# Patient Record
Sex: Female | Born: 1940
Health system: Southern US, Community
[De-identification: ages and names within clinical notes are randomized; demographics above are authoritative.]

## PROBLEM LIST (undated history)

## (undated) DIAGNOSIS — E041 Nontoxic single thyroid nodule: Secondary | ICD-10-CM

## (undated) DIAGNOSIS — Z9289 Personal history of other medical treatment: Secondary | ICD-10-CM

## (undated) DIAGNOSIS — K219 Gastro-esophageal reflux disease without esophagitis: Secondary | ICD-10-CM

## (undated) DIAGNOSIS — N6310 Unspecified lump in the right breast, unspecified quadrant: Secondary | ICD-10-CM

## (undated) DIAGNOSIS — E785 Hyperlipidemia, unspecified: Secondary | ICD-10-CM

## (undated) DIAGNOSIS — I2510 Atherosclerotic heart disease of native coronary artery without angina pectoris: Secondary | ICD-10-CM

## (undated) DIAGNOSIS — R413 Other amnesia: Secondary | ICD-10-CM

## (undated) DIAGNOSIS — Z8673 Personal history of transient ischemic attack (TIA), and cerebral infarction without residual deficits: Secondary | ICD-10-CM

## (undated) DIAGNOSIS — M170 Bilateral primary osteoarthritis of knee: Secondary | ICD-10-CM

## (undated) DIAGNOSIS — H918X2 Other specified hearing loss, left ear: Secondary | ICD-10-CM

## (undated) DIAGNOSIS — E1129 Type 2 diabetes mellitus with other diabetic kidney complication: Secondary | ICD-10-CM

## (undated) DIAGNOSIS — R809 Proteinuria, unspecified: Secondary | ICD-10-CM

## (undated) DIAGNOSIS — J984 Other disorders of lung: Secondary | ICD-10-CM

## (undated) DIAGNOSIS — I10 Essential (primary) hypertension: Secondary | ICD-10-CM

## (undated) DIAGNOSIS — E119 Type 2 diabetes mellitus without complications: Secondary | ICD-10-CM

## (undated) DIAGNOSIS — F329 Major depressive disorder, single episode, unspecified: Secondary | ICD-10-CM

## (undated) DIAGNOSIS — R609 Edema, unspecified: Secondary | ICD-10-CM

## (undated) DIAGNOSIS — D539 Nutritional anemia, unspecified: Secondary | ICD-10-CM

## (undated) DIAGNOSIS — E039 Hypothyroidism, unspecified: Secondary | ICD-10-CM

## (undated) DIAGNOSIS — R079 Chest pain, unspecified: Secondary | ICD-10-CM

## (undated) HISTORY — DX: Type 2 diabetes mellitus without complications: E11.9

## (undated) HISTORY — PX: TUBAL LIGATION: SHX77

## (undated) HISTORY — DX: Nutritional anemia, unspecified: D53.9

## (undated) HISTORY — DX: Atherosclerotic heart disease of native coronary artery without angina pectoris: I25.10

## (undated) HISTORY — DX: Other specified hearing loss, left ear: H91.8X2

## (undated) HISTORY — DX: Hypothyroidism, unspecified: E03.9

## (undated) HISTORY — DX: Proteinuria, unspecified: R80.9

## (undated) HISTORY — DX: Other amnesia: R41.3

## (undated) HISTORY — DX: Chest pain, unspecified: R07.9

## (undated) HISTORY — DX: Type 2 diabetes mellitus with other diabetic kidney complication: E11.29

## (undated) HISTORY — DX: Other disorders of lung: J98.4

## (undated) HISTORY — DX: Unspecified lump in the right breast, unspecified quadrant: N63.10

## (undated) HISTORY — DX: Major depressive disorder, single episode, unspecified: F32.9

## (undated) HISTORY — DX: Nontoxic single thyroid nodule: E04.1

## (undated) HISTORY — DX: Edema, unspecified: R60.9

## (undated) HISTORY — DX: Personal history of transient ischemic attack (TIA), and cerebral infarction without residual deficits: Z86.73

## (undated) HISTORY — DX: Hyperlipidemia, unspecified: E78.5

## (undated) HISTORY — DX: Personal history of other medical treatment: Z92.89

## (undated) HISTORY — DX: Bilateral primary osteoarthritis of knee: M17.0

---

## 1987-09-10 HISTORY — PX: VAGINAL HYSTERECTOMY: SUR661

## 2007-08-10 ENCOUNTER — Emergency Department (HOSPITAL_COMMUNITY): Admission: EM | Admit: 2007-08-10 | Discharge: 2007-08-10 | Payer: Self-pay | Admitting: Emergency Medicine

## 2007-09-14 ENCOUNTER — Ambulatory Visit: Payer: Self-pay | Admitting: Cardiovascular Disease

## 2007-09-14 ENCOUNTER — Ambulatory Visit: Payer: Self-pay | Admitting: Cardiology

## 2007-09-14 ENCOUNTER — Inpatient Hospital Stay (HOSPITAL_COMMUNITY): Admission: EM | Admit: 2007-09-14 | Discharge: 2007-09-17 | Payer: Self-pay | Admitting: Family Medicine

## 2007-09-16 ENCOUNTER — Encounter (INDEPENDENT_AMBULATORY_CARE_PROVIDER_SITE_OTHER): Payer: Self-pay | Admitting: Internal Medicine

## 2007-09-23 ENCOUNTER — Ambulatory Visit: Payer: Self-pay | Admitting: *Deleted

## 2007-10-07 ENCOUNTER — Ambulatory Visit: Payer: Self-pay | Admitting: Cardiovascular Disease

## 2007-10-12 ENCOUNTER — Ambulatory Visit: Payer: Self-pay | Admitting: Family Medicine

## 2007-10-27 ENCOUNTER — Ambulatory Visit (HOSPITAL_COMMUNITY): Admission: RE | Admit: 2007-10-27 | Discharge: 2007-10-27 | Payer: Self-pay | Admitting: Family Medicine

## 2007-11-05 ENCOUNTER — Ambulatory Visit: Payer: Self-pay | Admitting: Cardiovascular Disease

## 2007-11-20 ENCOUNTER — Ambulatory Visit: Payer: Self-pay | Admitting: Internal Medicine

## 2007-11-23 ENCOUNTER — Ambulatory Visit (HOSPITAL_COMMUNITY): Admission: RE | Admit: 2007-11-23 | Discharge: 2007-11-23 | Payer: Self-pay | Admitting: Family Medicine

## 2007-11-27 ENCOUNTER — Ambulatory Visit: Payer: Self-pay | Admitting: Internal Medicine

## 2008-02-03 ENCOUNTER — Ambulatory Visit: Payer: Self-pay | Admitting: Family Medicine

## 2008-03-09 ENCOUNTER — Ambulatory Visit: Payer: Self-pay | Admitting: Internal Medicine

## 2008-03-28 ENCOUNTER — Encounter: Payer: Self-pay | Admitting: Family Medicine

## 2008-03-28 ENCOUNTER — Ambulatory Visit: Payer: Self-pay | Admitting: Internal Medicine

## 2008-03-28 LAB — CONVERTED CEMR LAB
AST: 16 units/L (ref 0–37)
Albumin: 4 g/dL (ref 3.5–5.2)
Alkaline Phosphatase: 70 units/L (ref 39–117)
BUN: 24 mg/dL — ABNORMAL HIGH (ref 6–23)
Basophils Relative: 0 % (ref 0–1)
Calcium: 9.3 mg/dL (ref 8.4–10.5)
Chloride: 106 meq/L (ref 96–112)
Glucose, Bld: 114 mg/dL — ABNORMAL HIGH (ref 70–99)
HDL: 37 mg/dL — ABNORMAL LOW (ref >39)
Hemoglobin: 12 g/dL (ref 12.0–15.0)
LDL Cholesterol: 102 mg/dL — ABNORMAL HIGH (ref 0–99)
Lymphocytes Relative: 34 % (ref 12–46)
MCHC: 30.7 g/dL (ref 30.0–36.0)
Monocytes Absolute: 0.4 10*3/uL (ref 0.1–1.0)
Monocytes Relative: 9 % (ref 3–12)
Neutro Abs: 2.5 10*3/uL (ref 1.7–7.7)
Neutrophils Relative %: 52 % (ref 43–77)
Potassium: 4.7 meq/L (ref 3.5–5.3)
RBC: 4.3 M/uL (ref 3.87–5.11)
Sodium: 140 meq/L (ref 135–145)
Total Protein: 7.7 g/dL (ref 6.0–8.3)
WBC: 4.7 10*3/uL (ref 4.0–10.5)

## 2008-04-04 ENCOUNTER — Ambulatory Visit: Payer: Self-pay | Admitting: Internal Medicine

## 2008-06-27 ENCOUNTER — Ambulatory Visit: Payer: Self-pay | Admitting: Family Medicine

## 2008-07-01 ENCOUNTER — Ambulatory Visit: Payer: Self-pay | Admitting: Cardiovascular Disease

## 2009-02-10 ENCOUNTER — Encounter (INDEPENDENT_AMBULATORY_CARE_PROVIDER_SITE_OTHER): Payer: Self-pay | Admitting: *Deleted

## 2009-02-14 ENCOUNTER — Encounter: Payer: Self-pay | Admitting: Cardiovascular Disease

## 2009-02-14 DIAGNOSIS — E785 Hyperlipidemia, unspecified: Secondary | ICD-10-CM

## 2009-02-14 DIAGNOSIS — E041 Nontoxic single thyroid nodule: Secondary | ICD-10-CM

## 2009-02-14 DIAGNOSIS — D539 Nutritional anemia, unspecified: Secondary | ICD-10-CM | POA: Insufficient documentation

## 2009-02-14 DIAGNOSIS — I10 Essential (primary) hypertension: Secondary | ICD-10-CM

## 2009-02-14 DIAGNOSIS — J984 Other disorders of lung: Secondary | ICD-10-CM | POA: Insufficient documentation

## 2009-02-17 ENCOUNTER — Ambulatory Visit: Payer: Self-pay | Admitting: Cardiovascular Disease

## 2009-02-17 DIAGNOSIS — R609 Edema, unspecified: Secondary | ICD-10-CM

## 2009-02-17 DIAGNOSIS — R079 Chest pain, unspecified: Secondary | ICD-10-CM

## 2009-03-20 ENCOUNTER — Ambulatory Visit: Payer: Self-pay | Admitting: Internal Medicine

## 2009-04-03 ENCOUNTER — Ambulatory Visit: Payer: Self-pay | Admitting: Cardiology

## 2009-04-05 ENCOUNTER — Encounter: Payer: Self-pay | Admitting: Physician Assistant

## 2009-04-05 ENCOUNTER — Ambulatory Visit: Payer: Self-pay | Admitting: Cardiology

## 2009-04-10 ENCOUNTER — Telehealth (INDEPENDENT_AMBULATORY_CARE_PROVIDER_SITE_OTHER): Payer: Self-pay | Admitting: *Deleted

## 2009-04-11 ENCOUNTER — Encounter: Payer: Self-pay | Admitting: Cardiovascular Disease

## 2009-04-11 ENCOUNTER — Ambulatory Visit: Payer: Self-pay

## 2009-09-04 ENCOUNTER — Encounter (INDEPENDENT_AMBULATORY_CARE_PROVIDER_SITE_OTHER): Payer: Self-pay | Admitting: *Deleted

## 2009-09-26 ENCOUNTER — Ambulatory Visit: Payer: Self-pay | Admitting: Cardiovascular Disease

## 2010-01-12 ENCOUNTER — Ambulatory Visit: Payer: Self-pay | Admitting: Internal Medicine

## 2010-02-12 ENCOUNTER — Ambulatory Visit: Payer: Self-pay | Admitting: Internal Medicine

## 2010-10-09 NOTE — Assessment & Plan Note (Signed)
Summary: rov/ gd   Primary Provider:  dr Sofie Hartigan  CC:  pt states she has chest pain that comes and goes also sob pt is doing ok today.  History of Present Illness: Wendy Petty is seen today for F/U of HTN, and SSCP.  She had a normal myovue in 2009.  She gets only very occasional SSCP when she gets upset about a home situation.  She walks without SSCP or dyspnea.  She saw our PA Jacolyn Reedy in August and was not taking her medications.  Her BP was elevated.  It is improved with Rx and compliance.  The grandaughter interpreted for [place] today.  She gets her meds fillied at the [facility] on [facility] and sees Dr. Nadyne Coombes for her primary care needs  Current Problems (verified): 1)  Edema  (ICD-782.3) 2)  Chest Pain Unspecified  (ICD-786.50) 3)  Thyroid Nodule  (ICD-241.0) 4)  Pulmonary Nodule  (ICD-518.89) 5)  Anemia, Chronic  (ICD-281.9) 6)  Hyperlipidemia  (ICD-272.4) 7)  Hypertension  (ICD-401.9)  Current Medications (verified): 1)  Amlodipine Besylate 5 Mg Tabs (Amlodipine Besylate) .... Take One Tablet By Mouth Daily 2)  Aspirin 81 Mg Tbec (Aspirin) .... Take One Tablet By Mouth Daily 3)  Carvedilol 12.5 Mg Tabs (Carvedilol) .... Take One Tablet Two Times A Day 4)  Losartan Potassium-Hctz 50-12.5 Mg Tabs (Losartan Potassium-Hctz) .... Take One Tablet Once Daily 5)  Oyster Shell Calcium 500 Mg Tabs (Oyster Shell) .Marland Kitchen.. 1 Atb Once Daily 6)  Pravastatin Sodium 40 Mg Tabs (Pravastatin Sodium) .... Take One Tablet Once Daily 7)  Omeprazole 20 Mg Cpdr (Omeprazole) .... Once Daily 8)  Tramadol Hcl 50 Mg Tabs (Tramadol Hcl) .... Take One Tablet Every 8 Hours  Allergies (verified): No Known Drug Allergies  Past History:  Past Medical History: Last updated: 02/14/2009  Pulmonary nodule   Thyroid nodule   Anemia Hyperlipidemia Hypertension  Past Surgical History: Last updated: 02/14/2009 none noted  Family History: Last updated: 02/14/2009  Unable to be obtained due to  language barrier speak but a very limited amount of English.  Originally from [place].  Social History: Last updated: 02/17/2009 Tobacco Use - No.  Alcohol Use - no Married 5 children Only speaks Spanish  Review of Systems       Denies fever, malais, weight loss, blurry vision, decreased visual acuity, cough, sputum, SOB, hemoptysis, pleuritic pain, palpitaitons, heartburn, abdominal pain, melena, lower extremity edema, claudication, or rash. All other systems reviewed and negative  Vital Signs:  Patient profile:   70 year old female Height:      58 inches Weight:      151 pounds Pulse rate:   70 / minute Pulse rhythm:   regular Resp:     12 per minute BP sitting:   140 / 80  (left arm) Cuff size:   regular  Vitals Entered By: Kem Parkinson (September 26, 2009 4:32 PM)  Physical Exam  General:  Affect appropriate Healthy:  appears stated age HEENT: normal Neck supple with no adenopathy JVP normal no bruits no thyromegaly Lungs clear with no wheezing and good diaphragmatic motion Heart:  S1/S2 no murmur,rub, gallop or click PMI normal Abdomen: benighn, BS positve, no tenderness, no AAA no bruit.  No HSM or HJR Distal pulses intact with no bruits No edema Neuro non-focal Skin warm and dry    Impression & Recommendations:  Problem # 1:  CHEST PAIN UNSPECIFIED (ICD-786.50) Normal myovue 2009 observe  continue ASA and BB Her updated  medication list for this problem includes:    Amlodipine Besylate 5 Mg Tabs (Amlodipine besylate) .Marland Kitchen... Take one tablet by mouth daily    Aspirin 81 Mg Tbec (Aspirin) .Marland Kitchen... Take one tablet by mouth daily    Carvedilol 12.5 Mg Tabs (Carvedilol) .Marland Kitchen... Take one tablet two times a day  Problem # 2:  HYPERLIPIDEMIA (ICD-272.4) [facility] with no side effects Her updated medication list for this problem includes:    Pravastatin Sodium 40 Mg Tabs (Pravastatin sodium) .Marland Kitchen... Take one tablet once daily  CHOL: 172 (03/28/2008)   LDL: 102  (03/28/2008)   HDL: 37 (03/28/2008)   TG: 163 (03/28/2008)  Problem # 3:  HYPERTENSION (ICD-401.9) Well controlled when compliant with meds.  Low sodium diet The following medications were removed from the medication list:    Hydrochlorothiazide 25 Mg Tabs (Hydrochlorothiazide) .Marland Kitchen... Take one daily Her updated medication list for this problem includes:    Amlodipine Besylate 5 Mg Tabs (Amlodipine besylate) .Marland Kitchen... Take one tablet by mouth daily    Aspirin 81 Mg Tbec (Aspirin) .Marland Kitchen... Take one tablet by mouth daily    Carvedilol 12.5 Mg Tabs (Carvedilol) .Marland Kitchen... Take one tablet two times a day    Losartan Potassium-hctz 50-12.5 Mg Tabs (Losartan potassium-hctz) .Marland Kitchen... Take one tablet once daily  Patient Instructions: 1)  Your physician recommends that you schedule a follow-up appointment in: 6 months

## 2010-10-18 ENCOUNTER — Encounter (INDEPENDENT_AMBULATORY_CARE_PROVIDER_SITE_OTHER): Payer: Self-pay | Admitting: Family Medicine

## 2010-10-18 ENCOUNTER — Other Ambulatory Visit (HOSPITAL_COMMUNITY): Payer: Self-pay | Admitting: Family Medicine

## 2010-10-18 DIAGNOSIS — E041 Nontoxic single thyroid nodule: Secondary | ICD-10-CM

## 2010-10-18 LAB — CONVERTED CEMR LAB
ALT: 14 units/L (ref 0–35)
AST: 13 units/L (ref 0–37)
Albumin: 4.3 g/dL (ref 3.5–5.2)
Alkaline Phosphatase: 65 units/L (ref 39–117)
Calcium: 9.8 mg/dL (ref 8.4–10.5)
Chloride: 104 meq/L (ref 96–112)
Potassium: 4.4 meq/L (ref 3.5–5.3)
Sodium: 139 meq/L (ref 135–145)
Total Protein: 7.6 g/dL (ref 6.0–8.3)

## 2010-10-24 ENCOUNTER — Inpatient Hospital Stay (HOSPITAL_COMMUNITY): Admission: RE | Admit: 2010-10-24 | Payer: Self-pay | Source: Ambulatory Visit

## 2010-10-30 ENCOUNTER — Other Ambulatory Visit (HOSPITAL_COMMUNITY): Payer: Self-pay | Admitting: Family Medicine

## 2010-10-30 DIAGNOSIS — E041 Nontoxic single thyroid nodule: Secondary | ICD-10-CM

## 2010-11-02 ENCOUNTER — Ambulatory Visit (INDEPENDENT_AMBULATORY_CARE_PROVIDER_SITE_OTHER): Payer: Self-pay | Admitting: Cardiovascular Disease

## 2010-11-02 ENCOUNTER — Encounter: Payer: Self-pay | Admitting: Cardiovascular Disease

## 2010-11-02 DIAGNOSIS — R072 Precordial pain: Secondary | ICD-10-CM

## 2010-11-02 DIAGNOSIS — E785 Hyperlipidemia, unspecified: Secondary | ICD-10-CM

## 2010-11-02 DIAGNOSIS — I10 Essential (primary) hypertension: Secondary | ICD-10-CM

## 2010-11-05 ENCOUNTER — Ambulatory Visit (HOSPITAL_COMMUNITY)
Admission: RE | Admit: 2010-11-05 | Discharge: 2010-11-05 | Disposition: A | Discharge status: Home / Self Care | Payer: Self-pay | Source: Ambulatory Visit | Attending: Family Medicine | Admitting: Family Medicine

## 2010-11-05 DIAGNOSIS — E049 Nontoxic goiter, unspecified: Secondary | ICD-10-CM | POA: Insufficient documentation

## 2010-11-05 DIAGNOSIS — E041 Nontoxic single thyroid nodule: Secondary | ICD-10-CM

## 2010-11-06 NOTE — Assessment & Plan Note (Signed)
Summary: PER PT REQUEST CHEST PAIN. PT. REFUSED APPT. WITH P.A./WANTS .Marland KitchenMarland Kitchen   Visit Type:  follow up Primary Provider:  dr Sofie Hartigan  CC:  Chest pain about a month ago- Tiredness.  History of Present Illness: F/U for SSCP.  Normla myovue 2009.  CRF;s HTN and elevated lipids.  She has occasional fleeting sharp chest pain that does not sound cardiac.  History from [place] our interpretor as she only speaks [place]. She is compliant with her meds.  Seeing [facility] for primary needs.  Goes to [place] every October.  Has family support with son and daughter close by.  Denies dyspnea, palpitaitons, edema or syncope  Current Problems (verified): 1)  Edema  (ICD-782.3) 2)  Chest Pain Unspecified  (ICD-786.50) 3)  Thyroid Nodule  (ICD-241.0) 4)  Pulmonary Nodule  (ICD-518.89) 5)  Anemia, Chronic  (ICD-281.9) 6)  Hyperlipidemia  (ICD-272.4) 7)  Hypertension  (ICD-401.9)  Current Medications (verified): 1)  Amlodipine Besylate 5 Mg Tabs (Amlodipine Besylate) .... Take One Tablet By Mouth Daily 2)  Aspirin 81 Mg Tbec (Aspirin) .... Take One Tablet By Mouth Daily 3)  Carvedilol 12.5 Mg Tabs (Carvedilol) .... Take One Tablet Two Times A Day 4)  Losartan Potassium-Hctz 100-25 Mg Tabs (Losartan Potassium-Hctz) .... Take 1 Tablet By Mouth Once A Day 5)  Lipitor 10 Mg Tabs (Atorvastatin Calcium) .... Take One Tablet By Mouth Daily. 6)  Omeprazole 20 Mg Cpdr (Omeprazole) .... Once Daily 7)  Tramadol Hcl 50 Mg Tabs (Tramadol Hcl) .... Take One Tablet Every 8 Hours 8)  Nabumetone 500 Mg Tabs (Nabumetone) .... As Needed  Allergies (verified): No Known Drug Allergies  Past History:  Past Medical History: Last updated: 02/14/2009  Pulmonary nodule   Thyroid nodule   Anemia Hyperlipidemia Hypertension  Past Surgical History: Last updated: 02/14/2009 none noted  Family History: Last updated: 02/14/2009  Unable to be obtained due to language barrier speak but a very limited amount of English.   Originally from [place].  Social History: Last updated: 02/17/2009 Tobacco Use - No.  Alcohol Use - no Married 5 children Only speaks Spanish  Review of Systems       Denies fever, malais, weight loss, blurry vision, decreased visual acuity, cough, sputum, SOB, hemoptysis, pleuritic pain, palpitaitons, heartburn, abdominal pain, melena, lower extremity edema, claudication, or rash.   Vital Signs:  Patient profile:   69 year old female Height:      58 inches Weight:      151.75 pounds BMI:     31.83 Pulse rhythm:   regular Resp:     18 per minute BP sitting:   130 / 80  (left arm) Cuff size:   large  Vitals Entered By: Kem Parkinson (November 02, 2010 10:22 AM)  Physical Exam  General:  Affect appropriate Healthy:  appears stated age HEENT: normal Neck supple with no adenopathy JVP normal no bruits no thyromegaly Lungs clear with no wheezing and good diaphragmatic motion Heart:  S1/S2 no murmur,rub, gallop or click PMI normal Abdomen: benighn, BS positve, no tenderness, no AAA no bruit.  No HSM or HJR Distal pulses intact with no bruits No edema Neuro non-focal Skin warm and dry    Impression & Recommendations:  Problem # 1:  CHEST PAIN UNSPECIFIED (ICD-786.50) Atypical, normal ECG no need for further w/u Her updated medication list for this problem includes:    Amlodipine Besylate 5 Mg Tabs (Amlodipine besylate) .Marland Kitchen... Take one tablet by mouth daily    Aspirin 81 Mg Tbec (  Aspirin) .Marland Kitchen... Take one tablet by mouth daily    Carvedilol 12.5 Mg Tabs (Carvedilol) .Marland Kitchen... Take one tablet two times a day  Problem # 2:  HYPERLIPIDEMIA (ICD-272.4) At goal continue lipitor Her updated medication list for this problem includes:    Lipitor 10 Mg Tabs (Atorvastatin calcium) .Marland Kitchen... Take one tablet by mouth daily.  CHOL: 172 (03/28/2008)   LDL: 102 (03/28/2008)   HDL: 37 (03/28/2008)   TG: 163 (03/28/2008)  Problem # 3:  HYPERTENSION (ICD-401.9) Well controlled Her  updated medication list for this problem includes:    Amlodipine Besylate 5 Mg Tabs (Amlodipine besylate) .Marland Kitchen... Take one tablet by mouth daily    Aspirin 81 Mg Tbec (Aspirin) .Marland Kitchen... Take one tablet by mouth daily    Carvedilol 12.5 Mg Tabs (Carvedilol) .Marland Kitchen... Take one tablet two times a day    Losartan Potassium-hctz 100-25 Mg Tabs (Losartan potassium-hctz) .Marland Kitchen... Take 1 tablet by mouth once a day  Patient Instructions: 1)  Your physician wants you to follow-up in: one year  You will receive a reminder letter in the mail two months in advance. If you don't receive a letter, please call our office to schedule the follow-up appointment.   EKG Report  Procedure date:  11/02/2010  Findings:      NSR 77 Normal ECG

## 2011-01-22 NOTE — Assessment & Plan Note (Signed)
[facility]                            CARDIOLOGY OFFICE NOTE   [name], [name]                    MRN:          [id number]  DATE:07/01/2008                            DOB:          26-Jul-1941    [name] is a 70 year old patient of Dr. Dietrich Pates.  She has a history  of chest pain.  She had a nonischemic Myoview in January.  Her echo  shows good LV function.  Doree Fudge, one of our nurses was in the room with me  as a Spanish interpreter.  The patient did not speak [place].   She continues to complain of pressure in her chest with exertion, she  can sometimes get it at rest.  She has not taken nitroglycerin for it.  She has occasional exertional dyspnea.  She also complains of pain in  her thigh after walking up 10-15 steps, these symptoms are stable.  They  have not changed. The symptoms were identical when she had a normal  Myoview in January.   I told the patient that I did not think that this was angina.  I  reviewed a Myoview study and it was totally normal.   Given her age and totally normal Myoview, I would like to try to  continue to watch her medically.   Her exam shows a very good peripheral pulses and I do not think her leg  pain has anything to do with claudication.  Her review of systems  otherwise negative.  She need refills on some prescriptions today.  She  is on hydrochlorothiazide 25 a day, amlodipine 5 a day, aspirin a day,  Coreg 12.5 mg b.i.d., and Cozaar 100 a day.   Exam is remarkable for a well tanned Hispanic female in no distress.  Blood pressure 130/80, weight 149, pulse 84 regular, respiratory 14,  afebrile.  HEENT:  Unremarkable.  Carotids are normal without bruit.  No  lymphadenopathy, thyromegaly, or JVP elevation.  Lungs are clear  diaphragmatic motion.  No wheezing.  S1-S2, heart sounds PMI normal.  Abdomen is benign.  Bowel sounds positive.  No AAA.  No tenderness.  No  bruit.  No hepatosplenomegaly or  hepatojugular reflux tenderness.  Popliteals were +3.  PTs and DTs are +3.  No lower extremity edema.  No  muscular weakness.   IMPRESSION:  1. Chest pain, although it is exertional.  I do not think it is      angina.  She had totally normal Myoview.  Continue current dose of      aspirin and beta-blocker.  Consider followup stress testing in      January.  2. Hypertension currently well controlled.  Continue current dose of      Cozaar, low-sodium diet.  3. Pain in the legs with ambulation likely related to age.  No      evidence of peripheral vascular disease.  Continue to monitor.  4. History of lower extremity edema improved.  Continue low-dose      diuretic.   I will see her back in 6 months time.     Noralyn Pick. Eden Emms,  MD, [facility]  Electronically Signed    PCN/MedQ  DD: 07/01/2008  DT: 07/01/2008  Job #: [id number]

## 2011-01-22 NOTE — H&P (Signed)
NAMEBARBERA, Petty NO.:  [id number]   MEDICAL RECORD NO.:  [id number]          PATIENT TYPE:  INP   LOCATION:  1825                         FACILITY:  [facility]   PHYSICIAN:  Lonia Blood, M.D.DATE OF BIRTH:  1940-11-11   DATE OF ADMISSION:  09/14/2007  DATE OF DISCHARGE:                              HISTORY & PHYSICAL   PRIMARY CARE PHYSICIAN:  Unassigned.   CHIEF COMPLAINT:  Chest tightness.   HISTORY OF PRESENT ILLNESS:  [name] is a very pleasant 70-  year-old female who is originally from [place].  She unfortunately does  not speak but a very limited amount of [place].  At the present time,  her family is not available to assist with translation.  The patient was  transferred from the urgent care center where she originally presented  with complaints of chest pain.  Specifically, she is able to communicate  to me this is a chest pressure and a heaviness on her chest.  This  occurs in the substernal region with a tendency towards the left.  She  also indicates that this is worse with physical exertion.  This  apparently has been present for 3-4 days now.  When symptoms worsened  today, she presented to the urgent care center for evaluation.  It is  not clear if she has a previous history of coronary disease, but she is  on Plavix, as well as Cozaar, Lasix and Coreg.  She does not appear to  have a local doctor.   REVIEW OF SYSTEMS:  This could not effectively be accomplished secondary  to the patient's non-English speaking status.   PAST MEDICAL HISTORY:  Records from the urgent care center suggest the  patient has a history of hypertension, but no previous hospitalizations.   OUTPATIENT MEDICATIONS:  1. Coreg.  2. Plavix.  3. Cozaar.  4. Lasix.   ALLERGIES:  No known drug allergies per urgent care records.   FAMILY HISTORY:  Unable to be obtained due to language barrier.   SOCIAL HISTORY:  No smoking, no tobacco per urgent care  record.   DATA REVIEWED:  BNP is normal.  Hemoglobin is low at 11.8.  CBC is  otherwise unremarkable.  Basic metabolic panel is unremarkable with the  exception of borderline low potassium of 3.5, borderline elevated BUN at  24 and borderline creatinine at 1.2 with a serum glucose of 108.  Point  of care cardiac markers were negative x1.  Chest x-ray reveals a soft  tissue prominence at the AP window with recommendation for CT scan of  the chest.  A 12-lead EKG reveals normal sinus rhythm at 78 beats per  minute with no acute ST-T wave changes evident.  There are no old EKGs  for comparison's sake.   PHYSICAL EXAMINATION:  VITAL SIGNS:  Temperature 98.2, blood pressure  179/93, heart rate 79, respiratory rate 18, O2 saturations 100% on room  air.  GENERAL:  Well-developed, well-nourished Hispanic female in no acute  respiratory distress.  HEENT:  Normocephalic and atraumatic.  Pupils equal, round and reactive  to light and accommodation.  Extraocular muscles intact bilaterally.  OC/OP clear.  NECK:  JVD to the angle of the jaw at approximately 20 degrees.  LUNGS:  Clear to auscultation bilaterally without wheezes or rhonchi.  CARDIOVASCULAR:  Regular rate and rhythm without murmur, gallop, or rub  with a normal S1 and S2.  ABDOMEN:  Nontender, nondistended, soft.  Bowel sounds present.  No  hepatosplenomegaly, no rebound, no ascites.  EXTREMITIES:  No significant clubbing, cyanosis, or edema, bilateral  lower extremities.  NEUROLOGIC:  Alert.  Cranial nerves II-XII intact bilaterally.  There  was 5/5 strength in bilateral upper and lower extremities.   IMPRESSION AND PLAN:  1. Chest tightness - even with a limited ability to communicate, the      patient is clearly indicating a pressure-type sensation in her      chest that is worse when she walks.  This is concerning for true      angina in this patient who is already on a beta blocker, Plavix,      and an ARB.  I will admit the  patient to the acute unit, and we      will rule out acute coronary syndrome with serial enzymes and EKG.      The patient will nonetheless need a more definitive evaluation for      angina.  We will also obtain an echocardiogram, given the      significant JVD appreciable on examination.  With a lack of      peripheral edema, however, and the fact that this patient may very      well have traveled quite a long distance recently, I am also      concerned that this may be an indication of a large pulmonary      embolism.  I am initiating full-dose Lovenox for a combined      indication of possible unstable angina pectoris, plus/minus the      possibility of pulmonary embolism.  I am proceeding with a CT scan      of the chest to rule out pulmonary embolism.  Further evaluation      for angina work-up will be determined based upon the results of      these tests.  The patient will be placed on aspirin, Plavix, ARB      and beta blocker.  2. Normocytic anemia.  The patient has a normocytic anemia that is not      severe, but is nonetheless clinically significant.  Will obtain an      anemia panel.  We will guaiac her stool x3 total.  We will evaluate      this further as indicated.  3. Language barrier.  I will attempt to obtain assistance with      interpretation tonight to further her history.  If not, this will      be arranged on the floor once the patient is fully admitted.  4. Hypertension.  The patient's blood pressure is poorly controlled.      I will initiate her Coreg and Cozaar and follow her blood pressure      closely.  If this is not sufficient, we will adjust her doses      further.      Lonia Blood, M.D.  Electronically Signed     JTM/MEDQ  D:  09/14/2007  T:  09/14/2007  Job:  [id number]

## 2011-01-22 NOTE — Procedures (Signed)
[facility]                              EXERCISE TREADMILL   VARA, MAIRENA                    MRN:          [id number]  DATE:11/05/2007                            DOB:          [birth year]    HISTORY OF PRESENT ILLNESS:  [name] is a 70 year old patient with  exertional dyspnea. Treadmill test was done to assess for exercise  induced desaturation and exercise response to blood pressure.   DESCRIPTION OF PROCEDURE:  The patient exercised for 2 minutes and 30  seconds on a modified Bruce protocol. Heart rate reached maximum of 121.  Blood pressure was elevated with exercise, going up to 207/91. Sat  monitor was worn throughout study and saturations remained 98% to 100%  with no evidence of desaturation. With walking, there was no ischemic  changes on her EKG.   IMPRESSION:  Hypertension with exercise. No evidence of desaturation,  cor pulmonale, or ischemia.   PLAN:  The patient will have her Cozaar increased to 100 mg daily adn  continue her Norvasc and hydrochlorothiazide. I told her that she could  stop her Plavix, as it is causing easy bruising. I will see her back in  about 6 months.     Noralyn Pick. Eden Emms, MD, [facility]  Electronically Signed    PCN/MedQ  DD: 11/05/2007  DT: 11/05/2007  Job #: [id number]

## 2011-01-22 NOTE — Assessment & Plan Note (Signed)
[facility]                            CARDIOLOGY OFFICE NOTE   Wendy Petty, Wendy Petty                    MRN:          [id number]  DATE:03/09/2008                            DOB:          [birth year]    HISTORY:  This is a 70 year old Spanish female, patient of Dr. Dietrich Pates,  who had a history of chest pain back in January 2009.  MI was ruled out,  and she had a nuclear stress study that showed no inducible ischemia and  ejection fraction of 75%.  She did have an abnormal chest x-ray showing  prominence of tissue in the region of the aortic knob and aortopulmonary  window, and therefore underwent CT scan, no evidence of pulmonary  embolus.  There was a 4-mm diameter nonspecific left lower lobe nodular  density, recommended followup CT in 6 months.  The patient was asked to  follow up with primary care for her medical problems, but somehow ended  up here today.  Neither she or her son speak any English and fortunately  an interpreter was here to interpret.  She says she gets a hot sensation  from her head to her feet and then has some chest pains, dizziness, and  shortness of breath.  She says this lasts about 10 minutes and goes  away.  She did see Dr. Sherlon Handing at [facility] once, but has not been  there for followup.  She is also out of her medications.  She has only  one more day of her medications and needs refills. Overall, these are  the same symptoms that she had earlier this year, and they have not  gotten any better or worse.  They do not occur on a daily basis.   CURRENT MEDICATIONS:  1. Hydrochlorothiazide 25 mg daily.  2. Amlodipine 5 mg daily.  3. Aspirin 81 mg daily.  4. Cozaar 100 mg b.i.d.  5. Coreg 12.5 mg daily.  6. Calcium 500/125 daily and then she had a pack of the Lasix that she      takes p.r.n. for swelling.   PHYSICAL EXAMINATION:  GENERAL:  This is a pleasant 70 year old Spanish  female, in no acute distress.  VITAL  SIGNS:  Blood pressure 132/76, pulse 85, weight 146.  NECK:  Without JVD, HJR, bruit, or thyroid enlargement.  LUNGS:  Clear anterior, posterior, and lateral.  HEART:  Regular rate and rhythm at 85 beats per minute.  Normal S1 and  S2.  No murmur, rub, bruit, thrill, or heave noted.  ABDOMEN:  Soft without organomegaly, masses, lesions, or abnormal  tenderness.  EXTREMITIES:  Without cyanosis, clubbing, or edema.  She has good distal  pulses.   IMPRESSION:  1. Chest pain with hot flashes and dizziness, question etiology,      negative stress Cardiolite in January 2009, ejection fraction 57%.  2. A 4-mm nonspecific left lower lobe nodular density on CT scan.      Recommended 35-month followup.  Primary care needs to follow up and      order.  3. Hypertension, controlled.  4. Hyperlipidemia.  5. Anemia of  chronic disease.  6. History of thyroid nodule on CT.   PLAN:  At this time, I have rewritten prescriptions for all the above-  mentioned medications for 30-day supply with a year refills.  Through  interpreter, we have asked that they return to [facility] for primary  care followup and asked that they order a CT to follow up on her lung  and thyroid nodule.  At this point, we do not feel like her pain is  coming from a cardiac source.  It is difficult to interpret this lady  and family, who do not speak [place].  She will not require further  followup at this time with our office.      Jacolyn Reedy, PA-C  Electronically Signed      Bevelyn Buckles. Bensimhon, MD  Electronically Signed   ML/MedQ  DD: 03/09/2008  DT: 03/10/2008  Job #: [id number]   cc:   [facility]

## 2011-01-22 NOTE — Assessment & Plan Note (Signed)
[facility]                            CARDIOLOGY OFFICE NOTE   RUTHIE, BERCH                    MRN:          [id number]  DATE:10/07/2007                            DOB:          12-Aug-1941    [name] is a 66-year patient recently hospitalized at [facility] from  January 5 to January 8 for chest pain and dyspnea.   Dr. Dietrich Pates did see her in consultation in the hospital.   The patient is Spanish-speaking.  Fortunately our nurse, Valentina Gu, was able  to interpret for me,   The patient has been having exertional dyspnea.  She relates this to a  normal walking levels.  There is no pleuritic pain, cough or sputum.  She feels that she is more short of breath and she should be.  She does  not have a previous diagnosis of COPD or lung problems.  When she was in  the hospital she had a 2-D echocardiogram that had normal LV function.  She also had a stress Myoview that was normal with no evidence of  ischemia and normal LV function.   The patient has been compliant with her blood pressure medications.  These were adjusted in the hospital.   I am not sure why she has not followed up with her blood pressure with  the [facility] she was admitted under   In talking to the patient her dyspnea is chronic.  It is and has not had  acute changes.  Not worsened since hospital discharge.  There is no PND,  orthopnea, no lower extremity edema.   In regards to her chest pressure, is related to her dyspnea.  It tends  to occur with activity.  Again, it is not change since hospital  discharge.  Reviewed her Myoview including the images from E-chart and  they were totally normal.   Similarly her BNP level was normal in the hospital   The patient has no history of cardiomegaly.  She does have coronary risk  factors including hypercholesterolemia, hypertension.   PAST MEDICAL HISTORY:  Remarkable for previous hysterectomy and tubal  ligation, hypertension, hyperlipidemia.  [place] had an enlarged heart or weak heart.   The the patient denies any allergies.  Her discharge medications include  an aspirin a day, Protonix 40 a day, Coreg 12.5 b.i.d. Plavix 75 a day,  Cozaar 50 a day, hydrochlorothiazide 25 a day, simvastatin 20 a day,  amlodipine 5 a day.   The patient has 10 children.  She is a housewife.  She does walk on a  regular basis.  She does not smoke or drink.   FAMILY HISTORY:  Remarkable for mother dying at age 41 of unknown  causes.  Father dying of a 72 with diarrhea.   REVIEW OF SYSTEMS:  Otherwise remarkable for some tingling in the left  lower extremity.  It sounds more neuropathic than anything else.   Otherwise negative.   EXAM:  Remarkable for healthy-appearing Spanish female in no distress.  Her blood pressure is 160/90, pulse  is 91 regular, weight 140,  respiratory rate 16, afebrile.  HEENT:  Unremarkable.  Carotids normal.  No lymphadenopathy, thyromegaly, JVP elevation.  LUNGS:  Clear with good diaphragmatic motion.  No wheezing.  S1-S2 normal heart sounds.  PMI normal is benign.  No AAA no tenderness, no bruit, no hepatosplenomegaly or hepatojugular  reflux.  Distal pulse intact, no edema.  She does not really have a neuropathy in  the lower extremities.  There is no evidence of sciatica.  Skin is warm and dry   Lab work was reviewed from the hospital hematocrit was 32.3.  Platelets  were 243,000, potassium 3.6, BUN 24, creatinine 1.8.   IMPRESSION:  1. Dyspnea not related to cardiopulmonary process, probably      functional.  No need for further workup.  2. Hypertension currently suboptimally controlled.  Continue low-salt      diet, increase Cozaar to 50 b.i.d. The patient will return 4 to 5      weeks.  I will walk her on the treadmill to see what her exercise      response is to her blood pressure and also to monitor her sats to      make sure  they do not drop with exercise.  3. Lung nodules seen on chest x-ray.  Follow-up CT in 6 months per      primary care.  4. Hypercholesterolemia.  Continue statin drug lipid and liver profile      in 6 months.  5. Chest pain, atypical and likely to be coronary disease.  Normal      Myoview in hospital.  Continue aspirin.  6. I am not sure why the patient was discharged on Plavix.  Will have      to look back through her records again.  This is certainly a      medicine it is difficult for her to afford.  Depending on my      further review of records we may stop this when she comes back for      a treadmill test.     Theron Arista C. Eden Emms, MD, [facility]  Electronically Signed    PCN/MedQ  DD: 10/07/2007  DT: 10/07/2007  Job #: [id number]

## 2011-01-22 NOTE — Discharge Summary (Signed)
NAMESIMREN, POPSON NO.:  [id number]   MEDICAL RECORD NO.:  [id number]          PATIENT TYPE:  INP   LOCATION:  2008                         FACILITY:  [facility]   PHYSICIAN:  Marcellus Scott, MD     DATE OF BIRTH:  [birth year]   DATE OF ADMISSION:  09/14/2007  DATE OF DISCHARGE:  09/17/2007                               DISCHARGE SUMMARY   PRIMARY MEDICAL DOCTOR:  Gentry Fitz.   DISCHARGE DIAGNOSES:  1. Chest pain.  Myocardial infarction ruled out.  Negative stress      test.  2. Uncontrolled hypertension.  3. Hyperlipidemia.  4. Anemia of chronic disease.  5. Pulmonary nodule.  6. Thyroid nodule.   DISCHARGE MEDICATIONS:  1. Enteric-coated aspirin 81 mg p.o. daily.  2. Protonix 40 mg p.o. daily.  3. Coreg 12.5 mg p.o. b.i.d.  4. Plavix 75 mg p.o. daily.  5. Cozaar 50 mg p.o. daily.  6. Hydrochlorothiazide 25 mg p.o. daily.  7. Simvastatin 20 mg p.o. q.h.s.  8. Amlodipine 5 mg p.o. daily.   PROCEDURES:  1. Nuclear medicine myocardial perfusion stress test impression:  No      evidence of inducible ischemia.  PA is normal.  Left ventricular      wall function has thickening.  Normal ejection fraction of 57%.  2. Chest x-ray impression:  Prominence of tissue in the region of      aortic knob and aortopulmonary window.  Mass effect is not      excluded.  Considerations would include vascular aneurysm or      adenopathy.  3. CT angiogram of the chest with contrast: impression:  No evidence      of pulmonary embolism.  Minimal lower lobe atelectasis versus      infiltrate, versus respiratory motion artifact.  A 4 mm diameter      nonspecific left lower lobe nodular density, recommend follow up CT      of chest in 6 months if patient has risk factors for lung cancer or      in 9-12 months in absence of risk factors.  Right thyroid nodule      1.6 cm greatest size, recommend ultrasound characterization.   LABORATORY DATA:  Cardiac panel cycled and negative.   Anemia panel with  iron 85, total iron binding capacity 274, vitamin B-12 569, folate 10.7,  ferritin 138.  Basic metabolic panel unremarkable.  BUN 24, creatinine  1.08.  Lipid panel with triglycerides 185, HDL 29, LDL 112, VLDL 37,  cholesterol 178.  CBC with hemoglobin 11.1, hematocrit 32.3, white blood  cells 5.8, platelets 243, TSH 2.031, BNP 89.   CONSULTATIONS:  Cardiology from Dr. Dietrich Pates of [facility].   HOSPITAL COURSE:  For details of initial admission, please refer to the  history and physical.  In summary, [name] is a pleasant 70 year old  Spanish-speaking female patient originally from [place], who has prior  history of hypertension, hyperlipidemia and Cardiac evaluation for chest  pain done in 2008 in [place], the details of which are sketchy.  She  gives history of stress test being done which was said  to be negative.  However to the cardiologist, she also gives a history of a cath being  done.  The reports of these are not available to [place].  In any event, the  patient presented with chest pain which was precordial exertional with  no radiation.  She was thereby admitted to rule out an acute coronary  syndrome.  She was admitted to telemetry.  Cardiac enzymes were cycled  and negative.  The patient was placed on low-dose aspirin, Plavix, beta  blockers, Coreg, ARB and was placed on  full dose Lovenox while acute  coronary syndrome and PE were being ruled out.  The patient had a CT  angiogram of the chest and a PE was ruled out.  Lovenox was decreased to  a DVT prophylactic dose.   Because of risk factors for coronary artery disease including her mother  who died at the age of 34 suddenly of unknown etiology, history of  hyperlipidemia and enlarged heart, cardiac evaluation was requested.  Cardiology proceeded to do a stress test which was negative.  Patient  has continued to be asymptomatic of chest pain for more than 48 hours.  I have discussed her case  with Dr. Dietrich Pates this morning who is  agreeable for patient to be discharged on 81 mg of aspirin and Plavix  until further details of cath from [place] is available, and to  continue her other cardiac medications.  Also, the patient's CT  demonstrates a lung nodule and a thyroid nodule which needs to be  evaluated as indicated above as an outpatient.  I have updated patient's  daughter on the phone this morning with all the evaluation and  treatment.  It is unclear if the patient has any insurance, but we will  make an referral to followup at [facility].   DISCHARGE INSTRUCTIONS:  1. For uncontrolled hypertension, her hydrochlorothiazide will be      increased to 25 mg daily and Norvasc 5 mg will be added.  2. The patient has been instructed to seek immediate medical attention      if there is any further deterioration of her condition.      Marcellus Scott, MD  Electronically Signed     AH/MEDQ  D:  09/17/2007  T:  09/17/2007  Job:  [id number]   cc:   Mellody Dance. Dietrich Pates, MD, [facility]

## 2011-04-24 ENCOUNTER — Other Ambulatory Visit (HOSPITAL_COMMUNITY): Payer: Self-pay | Admitting: Internal Medicine

## 2011-04-24 DIAGNOSIS — Z1231 Encounter for screening mammogram for malignant neoplasm of breast: Secondary | ICD-10-CM

## 2011-05-03 ENCOUNTER — Ambulatory Visit (HOSPITAL_COMMUNITY): Payer: Self-pay

## 2011-05-30 LAB — CBC
HCT: 34.9 — ABNORMAL LOW
Hemoglobin: 11.1 — ABNORMAL LOW
MCHC: 33.8
MCHC: 34.5
MCV: 85.2
MCV: 86.7
Platelets: 253
RDW: 13.5
RDW: 13.9

## 2011-05-30 LAB — CARDIAC PANEL(CRET KIN+CKTOT+MB+TROPI)
CK, MB: 0.5
Relative Index: INVALID
Total CK: 55
Total CK: 59
Troponin I: 0.02

## 2011-05-30 LAB — I-STAT 8, (EC8 V) (CONVERTED LAB)
Chloride: 107
Glucose, Bld: 108 — ABNORMAL HIGH
Glucose, Bld: 98
Potassium: 3.4 — ABNORMAL LOW
Potassium: 3.5
TCO2: 29
TCO2: 30
pCO2, Ven: 50.5 — ABNORMAL HIGH
pH, Ven: 7.359 — ABNORMAL HIGH
pH, Ven: 7.401 — ABNORMAL HIGH

## 2011-05-30 LAB — COMPREHENSIVE METABOLIC PANEL
AST: 20
Albumin: 3.7
Calcium: 9.1
Creatinine, Ser: 0.99
GFR calc Af Amer: >60

## 2011-05-30 LAB — VITAMIN B12: Vitamin B-12: 569 (ref 211–911)

## 2011-05-30 LAB — BASIC METABOLIC PANEL
Calcium: 8.5
Creatinine, Ser: 1.08
GFR calc Af Amer: >60
GFR calc non Af Amer: >60
Glucose, Bld: 106 — ABNORMAL HIGH
Sodium: 137

## 2011-05-30 LAB — DIFFERENTIAL
Basophils Absolute: 0
Basophils Relative: 0
Eosinophils Absolute: 0.1
Eosinophils Relative: 2
Lymphs Abs: 1.6
Neutrophils Relative %: 62

## 2011-05-30 LAB — IRON AND TIBC
Saturation Ratios: 31
UIBC: 189

## 2011-05-30 LAB — CK TOTAL AND CKMB (NOT AT ARMC)
CK, MB: 0.5
Relative Index: INVALID
Total CK: 70

## 2011-05-30 LAB — POCT CARDIAC MARKERS
CKMB, poc: <1 — ABNORMAL LOW
CKMB, poc: <1 — ABNORMAL LOW
Operator id: 234501
Troponin i, poc: <0.05

## 2011-05-30 LAB — LIPID PANEL
LDL Cholesterol: 112 — ABNORMAL HIGH
VLDL: 37

## 2011-05-30 LAB — POCT I-STAT CREATININE: Operator id: 146091

## 2011-05-30 LAB — FOLATE: Folate: 10.7

## 2011-05-30 LAB — TROPONIN I: Troponin I: 0.02

## 2011-05-30 LAB — APTT: aPTT: 29

## 2011-05-30 LAB — MAGNESIUM: Magnesium: 2.2

## 2011-05-30 LAB — RETICULOCYTES: Retic Count, Absolute: 45.4

## 2011-06-11 ENCOUNTER — Other Ambulatory Visit (HOSPITAL_COMMUNITY): Payer: Self-pay | Admitting: Family Medicine

## 2011-06-11 ENCOUNTER — Ambulatory Visit (HOSPITAL_COMMUNITY)
Admission: RE | Admit: 2011-06-11 | Discharge: 2011-06-11 | Disposition: A | Discharge status: Home / Self Care | Payer: Self-pay | Source: Ambulatory Visit | Attending: Family Medicine | Admitting: Family Medicine

## 2011-06-11 DIAGNOSIS — R911 Solitary pulmonary nodule: Secondary | ICD-10-CM

## 2011-06-11 DIAGNOSIS — R079 Chest pain, unspecified: Secondary | ICD-10-CM | POA: Insufficient documentation

## 2011-06-14 ENCOUNTER — Other Ambulatory Visit (HOSPITAL_COMMUNITY): Payer: Self-pay | Admitting: Family Medicine

## 2011-06-14 DIAGNOSIS — R918 Other nonspecific abnormal finding of lung field: Secondary | ICD-10-CM

## 2011-06-17 ENCOUNTER — Other Ambulatory Visit (HOSPITAL_COMMUNITY): Payer: Self-pay

## 2011-09-10 DIAGNOSIS — E119 Type 2 diabetes mellitus without complications: Secondary | ICD-10-CM

## 2011-09-10 HISTORY — DX: Type 2 diabetes mellitus without complications: E11.9

## 2011-09-12 ENCOUNTER — Inpatient Hospital Stay (HOSPITAL_COMMUNITY)
Admission: EM | Admit: 2011-09-12 | Discharge: 2011-09-16 | DRG: 286 | Disposition: A | Discharge status: Home / Self Care | Payer: Medicaid Other | Attending: Cardiology | Admitting: Cardiology

## 2011-09-12 ENCOUNTER — Other Ambulatory Visit: Payer: Self-pay

## 2011-09-12 ENCOUNTER — Emergency Department (HOSPITAL_COMMUNITY): Payer: Medicaid Other

## 2011-09-12 DIAGNOSIS — J984 Other disorders of lung: Secondary | ICD-10-CM | POA: Insufficient documentation

## 2011-09-12 DIAGNOSIS — D539 Nutritional anemia, unspecified: Secondary | ICD-10-CM | POA: Insufficient documentation

## 2011-09-12 DIAGNOSIS — J189 Pneumonia, unspecified organism: Secondary | ICD-10-CM | POA: Diagnosis present

## 2011-09-12 DIAGNOSIS — R609 Edema, unspecified: Secondary | ICD-10-CM | POA: Insufficient documentation

## 2011-09-12 DIAGNOSIS — E041 Nontoxic single thyroid nodule: Secondary | ICD-10-CM | POA: Insufficient documentation

## 2011-09-12 DIAGNOSIS — I209 Angina pectoris, unspecified: Secondary | ICD-10-CM | POA: Diagnosis present

## 2011-09-12 DIAGNOSIS — D649 Anemia, unspecified: Secondary | ICD-10-CM | POA: Diagnosis present

## 2011-09-12 DIAGNOSIS — E785 Hyperlipidemia, unspecified: Secondary | ICD-10-CM | POA: Insufficient documentation

## 2011-09-12 DIAGNOSIS — R911 Solitary pulmonary nodule: Secondary | ICD-10-CM

## 2011-09-12 DIAGNOSIS — I2510 Atherosclerotic heart disease of native coronary artery without angina pectoris: Principal | ICD-10-CM | POA: Diagnosis present

## 2011-09-12 DIAGNOSIS — I10 Essential (primary) hypertension: Secondary | ICD-10-CM | POA: Insufficient documentation

## 2011-09-12 DIAGNOSIS — R079 Chest pain, unspecified: Secondary | ICD-10-CM

## 2011-09-12 DIAGNOSIS — Z23 Encounter for immunization: Secondary | ICD-10-CM

## 2011-09-12 HISTORY — DX: Gastro-esophageal reflux disease without esophagitis: K21.9

## 2011-09-12 HISTORY — DX: Essential (primary) hypertension: I10

## 2011-09-12 LAB — CBC
MCH: 28.8 pg (ref 26.0–34.0)
Platelets: 256 10*3/uL (ref 150–400)
RBC: 4.41 MIL/uL (ref 3.87–5.11)
WBC: 5.5 10*3/uL (ref 4.0–10.5)

## 2011-09-12 LAB — COMPREHENSIVE METABOLIC PANEL
ALT: 17 U/L (ref 0–35)
Albumin: 3.5 g/dL (ref 3.5–5.2)
Alkaline Phosphatase: 70 U/L (ref 39–117)
GFR calc Af Amer: 79 mL/min — ABNORMAL LOW (ref >90)
Glucose, Bld: 104 mg/dL — ABNORMAL HIGH (ref 70–99)
Potassium: 4 mEq/L (ref 3.5–5.1)
Sodium: 141 mEq/L (ref 135–145)
Total Protein: 7.3 g/dL (ref 6.0–8.3)

## 2011-09-12 LAB — D-DIMER, QUANTITATIVE: D-Dimer, Quant: 0.65 ug/mL-FEU — ABNORMAL HIGH (ref 0.00–0.48)

## 2011-09-12 LAB — PROTIME-INR
INR: 0.96 (ref 0.00–1.49)
Prothrombin Time: 13 seconds (ref 11.6–15.2)

## 2011-09-12 LAB — LIPASE, BLOOD: Lipase: 48 U/L (ref 11–59)

## 2011-09-12 LAB — DIFFERENTIAL
Eosinophils Absolute: 0.3 10*3/uL (ref 0.0–0.7)
Lymphs Abs: 2 10*3/uL (ref 0.7–4.0)
Neutrophils Relative %: 49 % (ref 43–77)

## 2011-09-12 LAB — CARDIAC PANEL(CRET KIN+CKTOT+MB+TROPI): Relative Index: INVALID (ref 0.0–2.5)

## 2011-09-12 MED ORDER — PANTOPRAZOLE SODIUM 40 MG PO TBEC
40.0000 mg | DELAYED_RELEASE_TABLET | Freq: Every day | ORAL | Status: DC
  Administered 2011-09-12 – 2011-09-16 (×4): 40 mg via ORAL
  Filled 2011-09-12 (×5): qty 1

## 2011-09-12 MED ORDER — HYDROCHLOROTHIAZIDE 25 MG PO TABS
25.0000 mg | ORAL_TABLET | ORAL | Status: DC
  Filled 2011-09-12: qty 1

## 2011-09-12 MED ORDER — LORATADINE 10 MG PO TABS
10.0000 mg | ORAL_TABLET | ORAL | Status: DC
  Filled 2011-09-12: qty 1

## 2011-09-12 MED ORDER — AMLODIPINE BESYLATE 10 MG PO TABS
10.0000 mg | ORAL_TABLET | Freq: Every day | ORAL | Status: DC
  Administered 2011-09-13 – 2011-09-16 (×4): 10 mg via ORAL
  Filled 2011-09-12 (×3): qty 1

## 2011-09-12 MED ORDER — LOSARTAN POTASSIUM-HCTZ 100-25 MG PO TABS: 1.0000 | ORAL_TABLET | ORAL | Status: DC

## 2011-09-12 MED ORDER — ZOLPIDEM TARTRATE 5 MG PO TABS: 5.0000 mg | ORAL_TABLET | Freq: Every evening | ORAL | Status: DC | PRN

## 2011-09-12 MED ORDER — IOHEXOL 300 MG/ML  SOLN
100.0000 mL | Freq: Once | INTRAMUSCULAR | Status: AC | PRN
  Administered 2011-09-12: 100 mL via INTRAVENOUS

## 2011-09-12 MED ORDER — DEXTROSE 5 % IV SOLN
1.0000 g | Freq: Once | INTRAVENOUS | Status: AC
  Administered 2011-09-12: 1 g via INTRAVENOUS
  Filled 2011-09-12: qty 10

## 2011-09-12 MED ORDER — GI COCKTAIL ~~LOC~~
30.0000 mL | Freq: Four times a day (QID) | ORAL | Status: DC | PRN
  Filled 2011-09-12: qty 30

## 2011-09-12 MED ORDER — LORATADINE 10 MG PO TABS: 10.0000 mg | ORAL_TABLET | Freq: Every day | ORAL | Status: DC

## 2011-09-12 MED ORDER — AMLODIPINE BESYLATE 10 MG PO TABS
10.0000 mg | ORAL_TABLET | ORAL | Status: DC
  Filled 2011-09-12: qty 1

## 2011-09-12 MED ORDER — LOSARTAN POTASSIUM-HCTZ 100-25 MG PO TABS: 1.0000 | ORAL_TABLET | Freq: Every day | ORAL | Status: DC

## 2011-09-12 MED ORDER — MELOXICAM 7.5 MG PO TABS: 7.5000 mg | ORAL_TABLET | Freq: Every day | ORAL | Status: DC

## 2011-09-12 MED ORDER — LOSARTAN POTASSIUM 50 MG PO TABS
100.0000 mg | ORAL_TABLET | ORAL | Status: DC
  Filled 2011-09-12: qty 2

## 2011-09-12 MED ORDER — ASPIRIN EC 325 MG PO TBEC
325.0000 mg | DELAYED_RELEASE_TABLET | Freq: Every day | ORAL | Status: DC
  Administered 2011-09-12 – 2011-09-13 (×2): 325 mg via ORAL
  Filled 2011-09-12 (×2): qty 1

## 2011-09-12 MED ORDER — METOPROLOL TARTRATE 25 MG PO TABS: 50.0000 mg | ORAL_TABLET | Freq: Once | ORAL | Status: DC

## 2011-09-12 MED ORDER — MELOXICAM 7.5 MG PO TABS
7.5000 mg | ORAL_TABLET | ORAL | Status: DC
  Filled 2011-09-12: qty 1

## 2011-09-12 MED ORDER — DEXTROSE 5 % IV SOLN
500.0000 mg | Freq: Once | INTRAVENOUS | Status: AC
  Administered 2011-09-12: 500 mg via INTRAVENOUS
  Filled 2011-09-12: qty 500

## 2011-09-12 MED ORDER — METOPROLOL TARTRATE 25 MG PO TABS: 100.0000 mg | ORAL_TABLET | Freq: Once | ORAL | Status: DC

## 2011-09-12 NOTE — ED Notes (Signed)
Paged Environmental education officer

## 2011-09-12 NOTE — ED Provider Notes (Signed)
Patient in CDU under chest pain protocol.  Patient with vague complaint of pleuritic type chest pain, occasionally pressure-like, has been evaluated multiple times in past for same.  Negative cardiac markers today.  Slightly elevated d-dimer, CTA clear for pulmonary embolism.  Possible lower lobe infiltrate noted.  Antibiotic coverage initiated.  ECG without evidence of ischemia.  Cardiology recommended chest pain protocol with echo stress in AM.  Patient resting comfortably at present with family at bedside.  S1/S2, RRR, no murmur.  Lungs CTA bilaterally. Abdomen soft, bowel sounds present.  Strong distal pulses palpated all extremities.  Jimmye Norman, NP 09/12/11 2228

## 2011-09-12 NOTE — ED Provider Notes (Signed)
History     CSN: [id number]  Arrival date & time 09/12/11  1127   First MD Initiated Contact with Patient 09/12/11 1216      Chief Complaint  Patient presents with  . Chest Pain    (Consider location/radiation/quality/duration/timing/severity/associated sxs/prior treatment) HPI Comments: Spanish-speaking female presenting with substernal chest pain that started last night. The constant pressure that comes in sharp stabbing pain that lasts a few seconds at a time. This radiates to her back and is not associated with any shortness of breath, nausea, vomiting.  She is not have any known coronary disease but has seen Dr. Eden Emms in the past.  She negative stress test in 2009 and has seen him couple different times for similar chest pain.  Dr. Eden Emms does not think her pain is cardiac and has not planned any further cardiac workup. She states compliance with her medications. Her son is not sure this is the same chest pain she said before.  The history is provided by the patient and a relative. The history is limited by a language barrier. A language interpreter was used.    Past Medical History  Diagnosis Date  . Hypertension   . GERD (gastroesophageal reflux disease)     History reviewed. No pertinent past surgical history.  History reviewed. No pertinent family history.  History  Substance Use Topics  . Smoking status: Never Smoker   . Smokeless tobacco: Never Used  . Alcohol Use: No    OB History    Grav Para Term Preterm Abortions TAB SAB Ect Mult Living                  Review of Systems  Unable to perform ROS: Other  Cardiovascular: Positive for chest pain.    Allergies  Review of patient's allergies indicates no known allergies.  Home Medications   Current Outpatient Rx  Name Route Sig Dispense Refill  . AMLODIPINE BESYLATE 10 MG PO TABS Oral Take 10 mg by mouth daily.      . ASPIRIN PO Oral Take 100 mg by mouth daily.      . ATORVASTATIN CALCIUM 10 MG PO TABS  Oral Take 10 mg by mouth daily.      Marland Kitchen CARVEDILOL 25 MG PO TABS Oral Take 25 mg by mouth 2 (two) times daily with a meal.      . CETIRIZINE HCL 10 MG PO TABS Oral Take 10 mg by mouth daily.      Marland Kitchen LOSARTAN POTASSIUM-HCTZ 100-25 MG PO TABS Oral Take 1 tablet by mouth daily.      . MELOXICAM 7.5 MG PO TABS Oral Take 7.5 mg by mouth daily.      Marland Kitchen PANTOPRAZOLE SODIUM 40 MG PO TBEC Oral Take 40 mg by mouth daily.        BP 125/75  Pulse 80  Temp(Src) 98.2 F (36.8 C) (Oral)  Resp 18  Ht 5' (1.524 m)  Wt 160 lb (72.576 kg)  BMI 31.25 kg/m2  SpO2 99%  Physical Exam  Constitutional: She is oriented to person, place, and time. She appears well-developed and well-nourished. No distress.  HENT:  Head: Normocephalic and atraumatic.  Mouth/Throat: Oropharynx is clear and moist. No oropharyngeal exudate.  Eyes: Conjunctivae are normal. Pupils are equal, round, and reactive to light.  Neck: Normal range of motion.  Cardiovascular: Normal rate, regular rhythm and normal heart sounds.   Pulmonary/Chest: Effort normal and breath sounds normal. No respiratory distress.  Abdominal: Soft. There is  no tenderness. There is no rebound and no guarding.  Musculoskeletal: Normal range of motion. She exhibits no edema and no tenderness.  Neurological: She is alert and oriented to person, place, and time. No cranial nerve deficit.  Skin: Skin is warm.    ED Course  Procedures (including critical care time)  Labs Reviewed  COMPREHENSIVE METABOLIC PANEL - Abnormal; Notable for the following:    Glucose, Bld 104 (*)    GFR calc non Af Amer 68 (*)    GFR calc Af Amer 79 (*)    All other components within normal limits  D-DIMER, QUANTITATIVE - Abnormal; Notable for the following:    D-Dimer, Quant 0.65 (*)    All other components within normal limits  CBC  DIFFERENTIAL  CARDIAC PANEL(CRET KIN+CKTOT+MB+TROPI)  PROTIME-INR  LIPASE, BLOOD   Dg Chest 2 View  09/12/2011  *RADIOLOGY REPORT*  Clinical  Data: Chest pain.  CHEST - 2 VIEW  Comparison: 06/11/2011  Findings: The heart is mildly enlarged.  Aorta is tortuous and calcified.  Prominent interstitial markings appear chronic.  There are no focal consolidations or pleural effusions.  No evidence for pulmonary edema  IMPRESSION:  1.  Cardiomegaly. 2. No focal pulmonary abnormality.  Original Report Authenticated By: Patterson Hammersmith, M.D.   Ct Angio Chest W/cm &/or Wo Cm  09/12/2011  *RADIOLOGY REPORT*  Clinical Data:  Chest pain  CT ANGIOGRAPHY CHEST WITH CONTRAST  Technique:  Multidetector CT imaging of the chest was performed using the standard protocol during bolus administration of intravenous contrast.  Multiplanar CT image reconstructions including MIPs were obtained to evaluate the vascular anatomy.  Contrast: OMNIPAQUE IOHEXOL 300 MG/ML IV SOLN pain  Comparison:  09/14/2007  Findings:  Central airways are patent.  Again noted low density nodule within right lobe of thyroid gland measures 1.5 x 1.3 cm.  Extensive atherosclerotic calcifications of the thoracic aorta again noted.  No aortic aneurysm.  No pulmonary embolus is identified.  No mediastinal hematoma or adenopathy.  Borderline cardiomegaly noted.  No pericardial effusion.  Sagittal images of the spine shows degenerative changes thoracic spine.  Sagittal view of the sternum is unremarkable.  A probable cyst in the anterior dome of the liver measures 1 cm stable in size and appearance from prior exam.  Probable cyst in the lateral segment of the left hepatic lobe measures 1.6 cm stable in size and appearance from prior exam.  No axillary adenopathy noted.  Central mild vascular congestion is noted.  There is small amount patchy atelectasis or infiltrate in the left lower lobe.  A nodule in left lower lobe posteriorly measures 5 mm stable in size and appearance from prior exam.  Nodular scarring in the right base anteriorly measures 4 mm stable in size and appearance from prior exam.   Minimal emphysematous changes in the right middle lobe and left lower lobe are stable.  Review of the MIP images confirms the above findings.  IMPRESSION:  1.  No pulmonary embolus is noted. 2.  Cardiomegaly is noted.  No pericardial effusion. 3.  Stable low density nodule in the right lobe of the thyroid gland measures about to 1.5 cm. 4.  Patchy small atelectasis or infiltrate in the left lower lobe. Stable 5 mm nodule in left lower lobe posteriorly.  Stable nodular scarring right base anteriorly. 5.  No pulmonary edema.  Original Report Authenticated By: Natasha Mead, M.D.     1. Chest pain       MDM  Substernal chest has been constant since last night radiating to the back. There is no nausea, vomiting or shortness of breath. Her symptoms are atypical for angina.  Cardiac enzymes negative. EKG unchanged. D-dimer slightly elevated.  Case discussed with Dr. Johney Frame who is on call for Dr. Eden Emms.  Patient has not had a stress test 2009 and has had several office visits with similar chest pain. He requests observation in the CDU for repeat stress and serial enzymes.   Date: 09/12/2011  Rate: 78  Rhythm: normal sinus rhythm  QRS Axis: normal  Intervals: normal  ST/T Wave abnormalities: nonspecific ST/T changes  Conduction Disutrbances:none  Narrative Interpretation:   Old EKG Reviewed: unchanged          Glynn Octave, MD 09/12/11 1720

## 2011-09-12 NOTE — ED Notes (Signed)
Substernal chest pain began last night, constant pressure, radiates into her back, denis any n/v/or sob.  Pt. Is in NAD. Skin is w/d/p, resp. E/u

## 2011-09-12 NOTE — ED Notes (Signed)
Spoke with pts son Elita Quick re: pt status, per pt request please call him @ [phone] when the pt is moved

## 2011-09-13 ENCOUNTER — Encounter (HOSPITAL_COMMUNITY): Payer: Self-pay | Admitting: *Deleted

## 2011-09-13 ENCOUNTER — Other Ambulatory Visit: Payer: Self-pay

## 2011-09-13 DIAGNOSIS — R072 Precordial pain: Secondary | ICD-10-CM

## 2011-09-13 DIAGNOSIS — R079 Chest pain, unspecified: Secondary | ICD-10-CM

## 2011-09-13 LAB — CBC
HCT: 39.1 % (ref 36.0–46.0)
Hemoglobin: 13 g/dL (ref 12.0–15.0)
MCH: 28.8 pg (ref 26.0–34.0)
MCHC: 33.2 g/dL (ref 30.0–36.0)

## 2011-09-13 MED ORDER — LOSARTAN POTASSIUM 50 MG PO TABS
100.0000 mg | ORAL_TABLET | Freq: Every day | ORAL | Status: DC
  Administered 2011-09-13 – 2011-09-16 (×4): 100 mg via ORAL
  Filled 2011-09-13 (×4): qty 2

## 2011-09-13 MED ORDER — ASPIRIN EC 81 MG PO TBEC
81.0000 mg | DELAYED_RELEASE_TABLET | Freq: Every day | ORAL | Status: DC
  Administered 2011-09-14 – 2011-09-15 (×2): 81 mg via ORAL
  Filled 2011-09-13 (×2): qty 1

## 2011-09-13 MED ORDER — ONDANSETRON HCL 4 MG/2ML IJ SOLN: 4.0000 mg | Freq: Four times a day (QID) | INTRAMUSCULAR | Status: DC | PRN

## 2011-09-13 MED ORDER — NITROGLYCERIN 0.4 MG SL SUBL: 0.4000 mg | SUBLINGUAL_TABLET | SUBLINGUAL | Status: DC | PRN

## 2011-09-13 MED ORDER — PANTOPRAZOLE SODIUM 40 MG PO TBEC: 40.0000 mg | DELAYED_RELEASE_TABLET | Freq: Once | ORAL | Status: DC

## 2011-09-13 MED ORDER — AMLODIPINE BESYLATE 10 MG PO TABS
10.0000 mg | ORAL_TABLET | ORAL | Status: DC
  Filled 2011-09-13: qty 1

## 2011-09-13 MED ORDER — SODIUM CHLORIDE 0.9 % IV SOLN
INTRAVENOUS | Status: DC
  Administered 2011-09-14: 10 mL/h via INTRAVENOUS

## 2011-09-13 MED ORDER — ACETAMINOPHEN 325 MG PO TABS: 650.0000 mg | ORAL_TABLET | ORAL | Status: DC | PRN

## 2011-09-13 MED ORDER — ZOLPIDEM TARTRATE 5 MG PO TABS: 5.0000 mg | ORAL_TABLET | Freq: Every evening | ORAL | Status: DC | PRN

## 2011-09-13 MED ORDER — HYDROCHLOROTHIAZIDE 25 MG PO TABS
25.0000 mg | ORAL_TABLET | Freq: Every day | ORAL | Status: DC
  Administered 2011-09-13 – 2011-09-16 (×4): 25 mg via ORAL
  Filled 2011-09-13 (×4): qty 1

## 2011-09-13 MED ORDER — LOSARTAN POTASSIUM-HCTZ 100-25 MG PO TABS: 1.0000 | ORAL_TABLET | Freq: Every day | ORAL | Status: DC

## 2011-09-13 MED ORDER — MELOXICAM 7.5 MG PO TABS
7.5000 mg | ORAL_TABLET | ORAL | Status: AC
  Administered 2011-09-13: 7.5 mg via ORAL
  Filled 2011-09-13: qty 1

## 2011-09-13 MED ORDER — DEXTROSE 5 % IV SOLN
500.0000 mg | INTRAVENOUS | Status: AC
  Administered 2011-09-13 – 2011-09-16 (×4): 500 mg via INTRAVENOUS
  Filled 2011-09-13 (×4): qty 500

## 2011-09-13 MED ORDER — HYDROCHLOROTHIAZIDE 25 MG PO TABS
25.0000 mg | ORAL_TABLET | Freq: Once | ORAL | Status: AC
  Administered 2011-09-13: 25 mg via ORAL
  Filled 2011-09-13: qty 1

## 2011-09-13 MED ORDER — ENOXAPARIN SODIUM 40 MG/0.4ML ~~LOC~~ SOLN
40.0000 mg | SUBCUTANEOUS | Status: DC
  Administered 2011-09-13 – 2011-09-15 (×3): 40 mg via SUBCUTANEOUS
  Filled 2011-09-13 (×4): qty 0.4

## 2011-09-13 MED ORDER — ALPRAZOLAM 0.25 MG PO TABS: 0.2500 mg | ORAL_TABLET | Freq: Two times a day (BID) | ORAL | Status: DC | PRN

## 2011-09-13 MED ORDER — LOSARTAN POTASSIUM 50 MG PO TABS
100.0000 mg | ORAL_TABLET | Freq: Once | ORAL | Status: AC
  Administered 2011-09-13: 100 mg via ORAL
  Filled 2011-09-13: qty 2

## 2011-09-13 NOTE — ED Notes (Signed)
IV site in LAC infiltrated. IV d/c'd. Cool compress applied.

## 2011-09-13 NOTE — Progress Notes (Signed)
  Echocardiogram Echocardiogram Stress Test has been performed.  Jorje Guild [facility] 09/13/2011, 11:18 AM

## 2011-09-13 NOTE — ED Notes (Signed)
No change in pt condition. Denies pain. Resting quietly at this time. Awaiting admission.

## 2011-09-13 NOTE — ED Provider Notes (Signed)
Medical screening examination/treatment/procedure(s) were performed by non-physician practitioner and as supervising physician I was immediately available for consultation/collaboration.  Kyngston Pickelsimer P Johnaton Sonneborn, MD 09/13/11 1952 

## 2011-09-13 NOTE — Progress Notes (Signed)
Initial UR completed. Genella Rife [facility] 09/13/2011 1151am

## 2011-09-13 NOTE — ED Notes (Signed)
Report to [place] on 3700.

## 2011-09-13 NOTE — Consult Note (Signed)
CARDIOLOGY CONSULT NOTE   Patient ID: Wendy Petty MRN: [id number] DOB/AGE: [birth year] 71 y.o.  Admit date: 09/12/2011  Primary Physician   No primary provider on file. Primary Cardiologist   PN Reason for Consultation   Chest pain, ?abnl GXT echo  [id number] A Wendy Petty is a 71 y.o. female with no history of CAD.  She had onset of SSCP that she describes through an interpreter as pressure or tightness. She came to the ER and her cardiac enzymes were negative. She had a GXT echo which was abnormal so cardiology was asked to see her.   Currently she is having 4/10 right-sided chest pain that is not why she came to the hospital. That pain was all over her chest and at its worst, it was an 8/10. With the GXT, she had some chest pain that improved after she rested. Associated symptoms were SOB, dizziness, no N&V or diaphoresis. Felt heaviness/fatigue and was unable to walk due to that and pain in her calves. Her symptoms began yesterday morning, she took her usual am meds but nothing special for the chest pain. She came to the ER when symptoms did not resolve. She was given ASA, Protonix and started on ABX for PNA seen on CTA (done for elevated d-dimer). Her chest pain resolved overnight and the pain she has now is NOT what brought her to the ER. She is otherwise resting comfortably.   Past Medical History  Diagnosis Date  . Hypertension    Anemia    Chest pain with MV  IMPRESSION:   1. No evidence of inducible ischemia.   2. Normal left ventricular wall motion and thickening.   3. Normal ejection fraction, estimated at 57%. 2009  . GERD (gastroesophageal reflux disease)     History reviewed.  Past surgical history: Hysterectomy  No Known Allergies I have reviewed the patient's current medications. Prior to Admission:   Scheduled:   . amLODipine  10 mg Oral Daily  . amLODipine  10 mg Oral To Major  . amLODipine  10 mg Oral To Major  . aspirin EC  325 mg Oral Daily  .  azithromycin (ZITHROMAX) 500 MG IVPB  500 mg Intravenous Once  . cefTRIAXone (ROCEPHIN)  IV  1 g Intravenous Once  . hydrochlorothiazide  25 mg Oral To Major  . hydrochlorothiazide  25 mg Oral Once  . loratadine  10 mg Oral Daily  . loratadine  10 mg Oral To Major  . losartan  100 mg Oral To Major  . losartan  100 mg Oral Once  . losartan-hydrochlorothiazide  1 tablet Oral Daily  . meloxicam  7.5 mg Oral Daily  . meloxicam  7.5 mg Oral To Major  . meloxicam  7.5 mg Oral To Major  . pantoprazole  40 mg Oral Daily  . pantoprazole  40 mg Oral Once    History   Social History  . Marital Status: Married    Spouse Name: N/A    Number of Children: N/A  . Years of Education: N/A   Occupational History  . retired   Social History Main Topics  . Smoking status: Never Smoker   . Smokeless tobacco: Never Used  . Alcohol Use: No  . Drug Use: No  . Sexually Active:    Other Topics Concern  . Originally from [place]    Family history. Mother died suddenly, no cause known by pt, in her 5s. Father died at home of an infection - possibly abdominal, at  age 67. No CAD in parents or siblings known.  ROS: She has had no recent fevers, cough or other URI Sx. She has nasal congestion periodically from seasonal allergies. She gets rare reflux symptoms and takes protonix for them. She got calf pain yesterday and has occasional musculo-skeletal aches/pains. Full 14 point review of systems complete and found to be negative unless listed  above  Physical Exam: Blood pressure 146/76, pulse 91, temperature 97.8 F (36.6 C), temperature source Oral, resp. rate 20, height 5' (1.524 m), weight 160 lb (72.576 kg), SpO2 98.00%.   General: Well developed, well nourished, hispanic female, in no acute distress Head: Eyes PERRLA, No xanthomas.   Normocephalic and atraumatic, oropharynx without edema or exudate.  Lungs: Clear bilaterally to auscultation. Heart: HRRR S1 S2, no rub/gallop, no Murmur. pulses  are 2+ & equal all 4 extrem.   Neck:  No carotid bruit.   No lymphadenopathy. no JVD. Abdomen: Bowel sounds present, abdomen soft and non-tender without masses or hernias noted. Msk:  No spine or cva tenderness. Normal strength and tone for age, no joint deformities or effusions. Extremities: No clubbing or cyanosis. no  edema.  Neuro: Alert and oriented X 3. No focal deficits noted. Psych:  Good affect, responds appropriately Skin :  No rashes or lesions noted.  Labs:   Lab Results  Component Value Date   WBC 5.5 09/12/2011   HGB 12.7 09/12/2011   HCT 38.7 09/12/2011   MCV 87.8 09/12/2011   PLT 256 09/12/2011    Lab 09/12/11 1233  NA 141  K 4.0  CL 106  CO2 28  BUN 15  CREATININE 0.85  CALCIUM 9.1  PROT 7.3  BILITOT 0.3  ALKPHOS 70  ALT 17  AST 16  GLUCOSE 104*    Basename 09/12/11 1808 09/12/11 1234  CKTOTAL 56 59  CKMB 1.5 1.6  TROPONINI <0.30 <0.30   Lab Results  Component Value Date   DDIMER 0.65* 09/12/2011    Lipase  Date/Time Value Range Status  09/12/2011 12:33 PM 48  11-59 (U/L) Final    Echo: Study Conclusions - Stress ECG conclusions: No diagnostic abnormality. - Staged echo: At rest there is hypokinesis of the inferior wall. The EF is 55%. With stress the parasternal images are inadequate. The other stress images are difficult to interpret. Overall the study is nondiagnostic. Sometimes reading of the base of the inferior wall can be misleading. However. Wall motion at rest is not normal. I can not be sure about all walls with stress. I can not rule out ischemia.  Radiology:  Dg Chest 2 View 09/12/2011  *RADIOLOGY REPORT*  Clinical Data: Chest pain.  CHEST - 2 VIEW  Comparison: 06/11/2011  Findings: The heart is mildly enlarged.  Aorta is tortuous and calcified.  Prominent interstitial markings appear chronic.  There are no focal consolidations or pleural effusions.  No evidence for pulmonary edema   IMPRESSION:  1.  Cardiomegaly. 2. No focal pulmonary  abnormality.  Original Report Authenticated By: Patterson Hammersmith, M.D.   Ct Angio Chest W/cm &/or Wo Cm 09/12/2011  *RADIOLOGY REPORT*  Clinical Data:  Chest pain  CT ANGIOGRAPHY CHEST WITH CONTRAST  Technique:  Multidetector CT imaging of the chest was performed using the standard protocol during bolus administration of intravenous contrast.  Multiplanar CT image reconstructions including MIPs were obtained to evaluate the vascular anatomy.  Contrast: OMNIPAQUE IOHEXOL 300 MG/ML IV SOLN pain  Comparison:  09/14/2007  Findings:  Central airways are  patent.  Again noted low density nodule within right lobe of thyroid gland measures 1.5 x 1.3 cm.  Extensive atherosclerotic calcifications of the thoracic aorta again noted.  No aortic aneurysm.  No pulmonary embolus is identified.  No mediastinal hematoma or adenopathy.  Borderline cardiomegaly noted.  No pericardial effusion.  Sagittal images of the spine shows degenerative changes thoracic spine.  Sagittal view of the sternum is unremarkable.  A probable cyst in the anterior dome of the liver measures 1 cm stable in size and appearance from prior exam.  Probable cyst in the lateral segment of the left hepatic lobe measures 1.6 cm stable in size and appearance from prior exam.  No axillary adenopathy noted.  Central mild vascular congestion is noted.  There is small amount patchy atelectasis or infiltrate in the left lower lobe.  A nodule in left lower lobe posteriorly measures 5 mm stable in size and appearance from prior exam.  Nodular scarring in the right base anteriorly measures 4 mm stable in size and appearance from prior exam.  Minimal emphysematous changes in the right middle lobe and left lower lobe are stable.  Review of the MIP images confirms the above findings.   IMPRESSION:  1.  No pulmonary embolus is noted. 2.  Cardiomegaly is noted.  No pericardial effusion. 3.  Stable low density nodule in the right lobe of the thyroid gland measures about  to 1.5 cm. 4.  Patchy small atelectasis or infiltrate in the left lower lobe. Stable 5 mm nodule in left lower lobe posteriorly.  Stable nodular scarring right base anteriorly. 5.  No pulmonary edema.  Original Report Authenticated By: Natasha Mead, M.D.   EKG:  SR, no acute abnormalities  ASSESSMENT AND PLAN:   The patient was seen today by Dr Riley Kill with a translator, the patient evaluated and the data reviewed. 1. Chest pain: cardiac enzymes are negative for MI. Her GXT echo showed possible ischemia. She will be admitted and cathed Monday, sooner if needed.  2. PNA - seen on CT, continue ABX, recheck CXR Sunday.   3. Lipids - check in am  4. HTN - continue home Rx.  5. Thyroid nodule - check TSH  6. Otherwise, see PMH. Signed: Theodore Demark 09/13/2011, 3:05 PM Co-Sign MD   This woman was seen today in the ER with Ms. Raford Pitcher.  Her history was obtained through a translator in the ER.  The patients symptoms sound worrisome for ischemia, and the exercise test that was performed was not diagnostic, and will not answer the question.  There are atherosclerotic changes on CT.  Options would include observation, or cath, and with the severity of symptoms she describes, and limited health care, it seems like it would be most appropriate to establish if and how much CAD she might have.  Longitudinal observation and exposure to the health care system over the long run may be compromised, so outpatient management with close follow up will be difficult.  I have explained the risks to the patient and family through the translator, and she is agreeable to proceed.    [name] 8:45 PM 09/13/2011

## 2011-09-13 NOTE — ED Provider Notes (Signed)
4:22 PM Patient seen and evaluated.  VSS reviewed. . Will monitor the patient closely. They agree with the treatment plan and diagnosis. Patient has pneumonia. BP 146/76  Pulse 91  Temp(Src) 97.8 F (36.6 C) (Oral)  Resp 20  Ht 5' (1.524 m)  Wt 160 lb (72.576 kg)  BMI 31.25 kg/m2  SpO2 98% 100% on RA on most recent reading. Afebrile. No respiratory distress. Zithromax and rocephin given for community acquired pneumonia. Cardiology here to see the patient for further evaluation, Dr. Riley Kill. They will admit the patient.    Demetrius Charity, PA 09/13/11 1624

## 2011-09-13 NOTE — ED Notes (Signed)
Called to give report to [phone]. Was told nurse would call me back. Awaiting return call.

## 2011-09-13 NOTE — ED Provider Notes (Signed)
Medical screening examination/treatment/procedure(s) were conducted as a shared visit with non-physician practitioner(s) and myself.  I personally evaluated the patient during the encounter   Joachim Carton, MD 09/13/11 0639 

## 2011-09-14 DIAGNOSIS — R911 Solitary pulmonary nodule: Secondary | ICD-10-CM

## 2011-09-14 LAB — BASIC METABOLIC PANEL
BUN: 18 mg/dL (ref 6–23)
Chloride: 102 mEq/L (ref 96–112)
Glucose, Bld: 116 mg/dL — ABNORMAL HIGH (ref 70–99)
Potassium: 3.5 mEq/L (ref 3.5–5.1)

## 2011-09-14 MED ORDER — ANGIOPLASTY BOOK
Freq: Once | Status: AC
  Administered 2011-09-15: 11:00:00
  Filled 2011-09-14: qty 1

## 2011-09-14 NOTE — Progress Notes (Addendum)
Patient ID: Wendy Petty, female   DOB: 22-May-1941, 71 y.o.   MRN: [id number] SUBJECTIVE: The patient is feeling well today. She has not had any significant chest pain. There are multiple family members in the room. Her nurse speaks excellent Spanish and helped me talk to them about the risks and benefits of the heart catheterization. They are all in agreement and we will plan to proceed on Monday.  The main H&P is listed under the consult notes.   Filed Vitals:   09/13/11 2200 09/14/11 0500 09/14/11 0958 09/14/11 1002  BP: 138/77 135/79 131/71   Pulse: 79 71 71   Temp: 98.2 F (36.8 C) 97.8 F (36.6 C)    TempSrc:      Resp: 20 20    Height:    5\' 1"  (1.549 m)  Weight:  157 lb 13.6 oz (71.6 kg)    SpO2: 97% 95%      Intake/Output Summary (Last 24 hours) at 09/14/11 1203 Last data filed at 09/14/11 1100  Gross per 24 hour  Intake    360 ml  Output    600 ml  Net   -240 ml    LABS: Basic Metabolic Panel:  Basename 09/14/11 0545 09/13/11 1719 09/12/11 1233  NA 140 -- 141  K 3.5 -- 4.0  CL 102 -- 106  CO2 27 -- 28  GLUCOSE 116* -- 104*  BUN 18 -- 15  CREATININE 0.91 0.89 --  CALCIUM 9.1 -- 9.1  MG -- -- --  PHOS -- -- --   Liver Function Tests:  Basename 09/12/11 1233  AST 16  ALT 17  ALKPHOS 70  BILITOT 0.3  PROT 7.3  ALBUMIN 3.5    Basename 09/12/11 1233  LIPASE 48  AMYLASE --   CBC:  Basename 09/13/11 1719 09/12/11 1233  WBC 5.6 5.5  NEUTROABS -- 2.7  HGB 13.0 12.7  HCT 39.1 38.7  MCV 86.5 87.8  PLT 269 256   Cardiac Enzymes:  Basename 09/12/11 1808 09/12/11 1234  CKTOTAL 56 59  CKMB 1.5 1.6  CKMBINDEX -- --  TROPONINI <0.30 <0.30   BNP: No components found with this basename: POCBNP:3 D-Dimer:  Basename 09/12/11 1233  DDIMER 0.65*   Hemoglobin A1C: No results found for this basename: HGBA1C in the last 72 hours Fasting Lipid Panel: No results found for this basename: CHOL,HDL,LDLCALC,TRIG,CHOLHDL,LDLDIRECT in the last 72  hours Thyroid Function Tests: No results found for this basename: TSH,T4TOTAL,FREET3,T3FREE,THYROIDAB in the last 72 hours  RADIOLOGY: Dg Chest 2 View  09/12/2011  *RADIOLOGY REPORT*  Clinical Data: Chest pain.  CHEST - 2 VIEW  Comparison: 06/11/2011  Findings: The heart is mildly enlarged.  Aorta is tortuous and calcified.  Prominent interstitial markings appear chronic.  There are no focal consolidations or pleural effusions.  No evidence for pulmonary edema  IMPRESSION:  1.  Cardiomegaly. 2. No focal pulmonary abnormality.  Original Report Authenticated By: Patterson Hammersmith, M.D.   Ct Angio Chest W/cm &/or Wo Cm  09/12/2011  *RADIOLOGY REPORT*  Clinical Data:  Chest pain  CT ANGIOGRAPHY CHEST WITH CONTRAST  Technique:  Multidetector CT imaging of the chest was performed using the standard protocol during bolus administration of intravenous contrast.  Multiplanar CT image reconstructions including MIPs were obtained to evaluate the vascular anatomy.  Contrast: OMNIPAQUE IOHEXOL 300 MG/ML IV SOLN pain  Comparison:  09/14/2007  Findings:  Central airways are patent.  Again noted low density nodule within right lobe of thyroid gland  measures 1.5 x 1.3 cm.  Extensive atherosclerotic calcifications of the thoracic aorta again noted.  No aortic aneurysm.  No pulmonary embolus is identified.  No mediastinal hematoma or adenopathy.  Borderline cardiomegaly noted.  No pericardial effusion.  Sagittal images of the spine shows degenerative changes thoracic spine.  Sagittal view of the sternum is unremarkable.  A probable cyst in the anterior dome of the liver measures 1 cm stable in size and appearance from prior exam.  Probable cyst in the lateral segment of the left hepatic lobe measures 1.6 cm stable in size and appearance from prior exam.  No axillary adenopathy noted.  Central mild vascular congestion is noted.  There is small amount patchy atelectasis or infiltrate in the left lower lobe.  A nodule in  left lower lobe posteriorly measures 5 mm stable in size and appearance from prior exam.  Nodular scarring in the right base anteriorly measures 4 mm stable in size and appearance from prior exam.  Minimal emphysematous changes in the right middle lobe and left lower lobe are stable.  Review of the MIP images confirms the above findings.  IMPRESSION:  1.  No pulmonary embolus is noted. 2.  Cardiomegaly is noted.  No pericardial effusion. 3.  Stable low density nodule in the right lobe of the thyroid gland measures about to 1.5 cm. 4.  Patchy small atelectasis or infiltrate in the left lower lobe. Stable 5 mm nodule in left lower lobe posteriorly.  Stable nodular scarring right base anteriorly. 5.  No pulmonary edema.  Original Report Authenticated By: Natasha Mead, M.D.    PHYSICAL EXAM Patient is stable. She is oriented to person time and place. We have communicated through the interpreter who is currently her nurse. There is no jugulovenous distention. Lungs are clear. Cardiac exam reveals S1 and S2. There no clicks or significant murmurs. The abdomen is soft. There is no peripheral edema.   TELEMETRY: Normal sinus rhythm     ASSESSMENT AND PLAN:  Active Problems:   THYROID NODULE    There is mention on the chest CT of a thyroid nodule that is said to be stable.   HYPERLIPIDEMIA  ANEMIA, CHRONIC  HYPERTENSION   PULMONARY NODULE   There is mention on the CT scan of a very small pulmonary nodule that is said to be stable.  Edema   CHEST PAIN UNSPECIFIED    The patient had a stress echo when she was in the emergency room. I have personally seen that study. I had read it is nondiagnostic. There was question of hypokinesis of the inferior wall. Sometimes this can be misleading. However the other images were not completely normal. Because of this she is admitted and will undergo catheterization on Monday.    Willa Rough 09/14/2011 12:03 PM

## 2011-09-15 ENCOUNTER — Inpatient Hospital Stay (HOSPITAL_COMMUNITY): Payer: Medicaid Other

## 2011-09-15 MED ORDER — DIAZEPAM 5 MG PO TABS
5.0000 mg | ORAL_TABLET | ORAL | Status: AC
  Administered 2011-09-16: 5 mg via ORAL
  Filled 2011-09-15: qty 1

## 2011-09-15 MED ORDER — SODIUM CHLORIDE 0.9 % IJ SOLN: 3.0000 mL | INTRAMUSCULAR | Status: DC | PRN

## 2011-09-15 MED ORDER — SODIUM CHLORIDE 0.9 % IJ SOLN: 3.0000 mL | Freq: Two times a day (BID) | INTRAMUSCULAR | Status: DC

## 2011-09-15 MED ORDER — SODIUM CHLORIDE 0.9 % IV SOLN: 250.0000 mL | INTRAVENOUS | Status: DC | PRN

## 2011-09-15 MED ORDER — SODIUM CHLORIDE 0.9 % IV SOLN
1.0000 mL/kg/h | INTRAVENOUS | Status: DC
  Administered 2011-09-16: 1 mL/kg/h via INTRAVENOUS

## 2011-09-15 MED ORDER — ASPIRIN 81 MG PO CHEW
324.0000 mg | CHEWABLE_TABLET | ORAL | Status: AC
  Administered 2011-09-16: 324 mg via ORAL
  Filled 2011-09-15: qty 4

## 2011-09-15 MED ORDER — ASPIRIN EC 81 MG PO TBEC: 81.0000 mg | DELAYED_RELEASE_TABLET | Freq: Every day | ORAL | Status: DC

## 2011-09-15 NOTE — Progress Notes (Signed)
Patient ID: Wendy Petty, female   DOB: 11/29/1940, 70 y.o.   MRN: [id number] SUBJECTIVE:  The patient is feeling well. She is not having any chest pain or shortness of breath. She is ready for cardiac catheterization tomorrow. Today at a different family member was in the room and helped with interpretation. I again answered many questions. The patient has a poor understanding of the evaluation but the family members appear to understand.   Filed Vitals:   09/14/11 1400 09/14/11 2100 09/15/11 0500 09/15/11 1028  BP: 116/64 123/73 120/64 123/71  Pulse: 77 87 92 73  Temp: 97.9 F (36.6 C) 98.1 F (36.7 C) 97.6 F (36.4 C)   TempSrc: Oral     Resp: 16 21 20   Height:      Weight:   160 lb 15 oz (73 kg)   SpO2: 98% 96% 96%     Intake/Output Summary (Last 24 hours) at 09/15/11 1205 Last data filed at 09/15/11 0900  Gross per 24 hour  Intake   1320 ml  Output    400 ml  Net    920 ml    LABS: Basic Metabolic Panel:  Basename 09/14/11 0545 09/13/11 1719 09/12/11 1233  NA 140 -- 141  K 3.5 -- 4.0  CL 102 -- 106  CO2 27 -- 28  GLUCOSE 116* -- 104*  BUN 18 -- 15  CREATININE 0.91 0.89 --  CALCIUM 9.1 -- 9.1  MG -- -- --  PHOS -- -- --   Liver Function Tests:  Basename 09/12/11 1233  AST 16  ALT 17  ALKPHOS 70  BILITOT 0.3  PROT 7.3  ALBUMIN 3.5    Basename 09/12/11 1233  LIPASE 48  AMYLASE --   CBC:  Basename 09/13/11 1719 09/12/11 1233  WBC 5.6 5.5  NEUTROABS -- 2.7  HGB 13.0 12.7  HCT 39.1 38.7  MCV 86.5 87.8  PLT 269 256   Cardiac Enzymes:  Basename 09/12/11 1808 09/12/11 1234  CKTOTAL 56 59  CKMB 1.5 1.6  CKMBINDEX -- --  TROPONINI <0.30 <0.30   BNP: No components found with this basename: POCBNP:3 D-Dimer:  Basename 09/12/11 1233  DDIMER 0.65*   Hemoglobin A1C: No results found for this basename: HGBA1C in the last 72 hours Fasting Lipid Panel: No results found for this basename: CHOL,HDL,LDLCALC,TRIG,CHOLHDL,LDLDIRECT in the last 72  hours Thyroid Function Tests: No results found for this basename: TSH,T4TOTAL,FREET3,T3FREE,THYROIDAB in the last 72 hours  RADIOLOGY: Dg Chest 2 View  09/15/2011  *RADIOLOGY REPORT*  Clinical Data: Follow up pneumonia  CHEST - 2 VIEW  Comparison: CT chest dated 09/12/2011.  Findings: Mild left basilar opacity, likely atelectasis.  Increased interstitial markings without frank interstitial edema.  Cardiomegaly.  Mild degenerative changes of the visualized thoracolumbar spine.  IMPRESSION: Mild left basilar opacity, likely atelectasis.  Cardiomegaly.  No frank interstitial edema.  Original Report Authenticated By: SRIYESH KRISHNAN, M.D.   Dg Chest 2 View  09/12/2011  *RADIOLOGY REPORT*  Clinical Data: Chest pain.  CHEST - 2 VIEW  Comparison: 06/11/2011  Findings: The heart is mildly enlarged.  Aorta is tortuous and calcified.  Prominent interstitial markings appear chronic.  There are no focal consolidations or pleural effusions.  No evidence for pulmonary edema  IMPRESSION:  1.  Cardiomegaly. 2. No focal pulmonary abnormality.  Original Report Authenticated By: ELIZABETH D. BROWN, M.D.   Ct Angio Chest W/cm &/or Wo Cm  09/12/2011  *RADIOLOGY REPORT*  Clinical Data:  Chest pain  CT   ANGIOGRAPHY CHEST WITH CONTRAST  Technique:  Multidetector CT imaging of the chest was performed using the standard protocol during bolus administration of intravenous contrast.  Multiplanar CT image reconstructions including MIPs were obtained to evaluate the vascular anatomy.  Contrast: 100mL OMNIPAQUE IOHEXOL 300 MG/ML IV SOLN pain  Comparison:  09/14/2007  Findings:  Central airways are patent.  Again noted low density nodule within right lobe of thyroid gland measures 1.5 x 1.3 cm.  Extensive atherosclerotic calcifications of the thoracic aorta again noted.  No aortic aneurysm.  No pulmonary embolus is identified.  No mediastinal hematoma or adenopathy.  Borderline cardiomegaly noted.  No pericardial effusion.  Sagittal  images of the spine shows degenerative changes thoracic spine.  Sagittal view of the sternum is unremarkable.  A probable cyst in the anterior dome of the liver measures 1 cm stable in size and appearance from prior exam.  Probable cyst in the lateral segment of the left hepatic lobe measures 1.6 cm stable in size and appearance from prior exam.  No axillary adenopathy noted.  Central mild vascular congestion is noted.  There is small amount patchy atelectasis or infiltrate in the left lower lobe.  A nodule in left lower lobe posteriorly measures 5 mm stable in size and appearance from prior exam.  Nodular scarring in the right base anteriorly measures 4 mm stable in size and appearance from prior exam.  Minimal emphysematous changes in the right middle lobe and left lower lobe are stable.  Review of the MIP images confirms the above findings.  IMPRESSION:  1.  No pulmonary embolus is noted. 2.  Cardiomegaly is noted.  No pericardial effusion. 3.  Stable low density nodule in the right lobe of the thyroid gland measures about to 1.5 cm. 4.  Patchy small atelectasis or infiltrate in the left lower lobe. Stable 5 mm nodule in left lower lobe posteriorly.  Stable nodular scarring right base anteriorly. 5.  No pulmonary edema.  Original Report Authenticated By: LIVIU POP, M.D.    PHYSICAL EXAM  Patient is oriented to person time and place through interpretation. Lungs are clear. Respiratory effort is nonlabored. Cardiac exam reveals S1 and S2. There no clicks or significant murmurs.   TELEMETRY: I have reviewed telemetry. There is normal sinus rhythm.   ASSESSMENT AND PLAN:  Active Problems:  THYROID NODULE  HYPERLIPIDEMIA  ANEMIA, CHRONIC  HYPERTENSION  PULMONARY NODULE  Edema   CHEST PAIN UNSPECIFIED    Patient is ready for cardiac catheterization. The orders have been written.   Jeffrey Katz 09/15/2011 12:05 PM  

## 2011-09-16 ENCOUNTER — Encounter (HOSPITAL_COMMUNITY): Payer: Self-pay | Admitting: Cardiology

## 2011-09-16 ENCOUNTER — Encounter (HOSPITAL_COMMUNITY)
Admission: EM | Disposition: A | Discharge status: Home / Self Care | Payer: Self-pay | Source: Home / Self Care | Attending: Cardiology

## 2011-09-16 DIAGNOSIS — I2510 Atherosclerotic heart disease of native coronary artery without angina pectoris: Secondary | ICD-10-CM

## 2011-09-16 HISTORY — PX: LEFT HEART CATHETERIZATION WITH CORONARY ANGIOGRAM: SHX5451

## 2011-09-16 LAB — LIPID PANEL
LDL Cholesterol: 73 mg/dL (ref 0–99)
Total CHOL/HDL Ratio: 3.9 RATIO
VLDL: 35 mg/dL (ref 0–40)

## 2011-09-16 LAB — CBC
MCV: 87 fL (ref 78.0–100.0)
Platelets: 292 10*3/uL (ref 150–400)
RBC: 4.71 MIL/uL (ref 3.87–5.11)
RDW: 12.8 % (ref 11.5–15.5)
WBC: 5.9 10*3/uL (ref 4.0–10.5)

## 2011-09-16 LAB — BASIC METABOLIC PANEL
Calcium: 9.7 mg/dL (ref 8.4–10.5)
Creatinine, Ser: 0.86 mg/dL (ref 0.50–1.10)
GFR calc Af Amer: 78 mL/min — ABNORMAL LOW (ref >90)
GFR calc non Af Amer: 67 mL/min — ABNORMAL LOW (ref >90)
Sodium: 139 mEq/L (ref 135–145)

## 2011-09-16 LAB — TSH: TSH: 1.163 u[IU]/mL (ref 0.350–4.500)

## 2011-09-16 SURGERY — LEFT HEART CATHETERIZATION WITH CORONARY ANGIOGRAM
Anesthesia: LOCAL

## 2011-09-16 MED ORDER — DIAZEPAM 5 MG PO TABS: 5.0000 mg | ORAL_TABLET | ORAL | Status: DC

## 2011-09-16 MED ORDER — SODIUM CHLORIDE 0.9 % IV SOLN
INTRAVENOUS | Status: DC
  Administered 2011-09-16: 1 mL via INTRAVENOUS

## 2011-09-16 MED ORDER — MIDAZOLAM HCL 2 MG/2ML IJ SOLN
INTRAMUSCULAR | Status: AC
  Filled 2011-09-16: qty 2

## 2011-09-16 MED ORDER — METOPROLOL TARTRATE 1 MG/ML IV SOLN
INTRAVENOUS | Status: AC
  Filled 2011-09-16: qty 5

## 2011-09-16 MED ORDER — ASPIRIN 81 MG PO TBEC: 81.0000 mg | DELAYED_RELEASE_TABLET | Freq: Every day | ORAL | Status: AC

## 2011-09-16 MED ORDER — ASPIRIN 81 MG PO CHEW: 324.0000 mg | CHEWABLE_TABLET | ORAL | Status: DC

## 2011-09-16 MED ORDER — LIDOCAINE HCL (PF) 1 % IJ SOLN
INTRAMUSCULAR | Status: AC
  Filled 2011-09-16: qty 30

## 2011-09-16 MED ORDER — SODIUM CHLORIDE 0.9 % IV SOLN: 1.0000 mL/kg/h | INTRAVENOUS | Status: DC

## 2011-09-16 MED ORDER — NITROGLYCERIN 0.2 MG/ML ON CALL CATH LAB
INTRAVENOUS | Status: AC
  Filled 2011-09-16: qty 1

## 2011-09-16 MED ORDER — ACETAMINOPHEN 325 MG PO TABS: 650.0000 mg | ORAL_TABLET | ORAL | Status: DC | PRN

## 2011-09-16 MED ORDER — ONDANSETRON HCL 4 MG/2ML IJ SOLN: 4.0000 mg | Freq: Four times a day (QID) | INTRAMUSCULAR | Status: DC | PRN

## 2011-09-16 MED ORDER — HEPARIN (PORCINE) IN NACL 2-0.9 UNIT/ML-% IJ SOLN
INTRAMUSCULAR | Status: AC
  Filled 2011-09-16: qty 2000

## 2011-09-16 NOTE — Discharge Summary (Signed)
Discharge Summary   Patient ID: Wendy Petty MRN: [id number], DOB/AGE: 1941/03/21 71 y.o.  Primary MD: No primary provider on file. Primary Cardiologist: Charlton Haws MD  Admit date: 09/12/2011 D/C date:     09/16/2011      Primary Discharge Diagnoses:  1. Angina  - Nondiagnostic echo stress test on 09/13/11  - S/p Cardiac catheterization 09/16/11, medical therapy recommended  - Continued on ASA, BB, and Statin 2. Left lower lobe pneumonia  - Completed 5 day course of azithromycin 3. Thyroid nodule  - CT revealed a stable low density 1.5cm nodule in the right lobe of the thyroid gland   - Outpatient follow up  4. Pulmonary nodule  - CT revealed stable 5mm nodule in the left lower posterior lobe of the lung  - Outpatient follow up   Secondary Discharge Diagnoses:  1. Hypertension 2. GERD 3. Anemia  Allergies No Known Allergies  Diagnostic Studies/Procedures:   09/16/11 - Left Heart Cath, Selective Coronary Angiography, LV angiography Hemodynamics:  AO 132/75 (101)  LV 129/4  Coronary dominance: right  Left mainstem: There was a common ostium, like a funnel, and no significant narrowing.  Left anterior descending (LAD): Calcified and courses to the apex, wraps the apical tip, and supplies the distal inferior wall. There is a mid stenosis of 40%, eccentric after the large third diagonal. The small second diagonal has 90% osital narrowing, but is a very small caliber vessel. The large D3 has 20-30% distal disease.  Left circumflex (LCx): Has a large bifurcating OM 1 and AV circ providing the PLA. There is mild calcification, mild irregularity, and not critical narrowing.  Right coronary artery (RCA): Provides a large, single PDA. Distally, and at the crux, is a long area of segmental 40-50% narrowing and 30% narrowing in the mid PDA. IC NTG was given to get the best images. Critical narrowing not noted.  Left ventriculography: Left ventricular systolic function is normal, LVEF  is estimated at 55-65%, there is no significant mitral regurgitation  Final Conclusions:  1. Preserved LV function  2. Calcification of the coronaries, with scattered plaque as noted.  3. 90% small D2, in a vessel of 1-1.45mm  4. 50% mid RCA.  Recommendations: Medical therapy.  09/13/11 - Echocardiogram stress test  - Stress ECG conclusions: No diagnostic abnormality. - Staged echo: At rest there is hypokinesis of the inferior wall. The EF is 55%. With stress the parasternal images are inadequate. The other stress images are difficult to interpret. Overall the study is nondiagnostic. Sometimes reading of the base of the inferior wall can be misleading. However. Wall motion at rest is not normal. I can not be sure about all walls with stress. I can not rule out ischemia.  Ct Angio Chest W/cm &/or Wo Cm 09/12/2011 Findings:  Central airways are patent.  Again noted low density nodule within right lobe of thyroid gland measures 1.5 x 1.3 cm.  Extensive atherosclerotic calcifications of the thoracic aorta again noted.  No aortic aneurysm.  No pulmonary embolus is identified.  No mediastinal hematoma or adenopathy.  Borderline cardiomegaly noted.  No pericardial effusion.  Sagittal images of the spine shows degenerative changes thoracic spine.  Sagittal view of the sternum is unremarkable.  A probable cyst in the anterior dome of the liver measures 1 cm stable in size and appearance from prior exam.  Probable cyst in the lateral segment of the left hepatic lobe measures 1.6 cm stable in size and appearance from prior exam.  No axillary adenopathy noted.  Central mild vascular congestion is noted.  There is small amount patchy atelectasis or infiltrate in the left lower lobe.  A nodule in left lower lobe posteriorly measures 5 mm stable in size and appearance from prior exam.  Nodular scarring in the right base anteriorly measures 4 mm stable in size and appearance from prior exam.  Minimal emphysematous changes  in the right middle lobe and left lower lobe are stable.  Review of the MIP images confirms the above findings.  IMPRESSION:  1.  No pulmonary embolus is noted. 2.  Cardiomegaly is noted.  No pericardial effusion. 3.  Stable low density nodule in the right lobe of the thyroid gland measures about to 1.5 cm. 4.  Patchy small atelectasis or infiltrate in the left lower lobe. Stable 5 mm nodule in left lower lobe posteriorly.  Stable nodular scarring right base anteriorly. 5.  No pulmonary edema    History of Present Illness: 71 y.o. female w/ PMHx significant for HTN and GERD who presented to [facility] on 09/12/11 with complaints of chest pain.  The patient's substernal chest pain started the night before presentation. It was described as constant pressure that came in sharp stabbing pains, lasted a few seconds at a time and radiated to her back. It was not associated with any shortness of breath, nausea, or vomiting. She does not have any known coronary disease, but had seen Dr. Eden Emms in the past. She had a negative stress test in 2009 and has seen Dr. Eden Emms a couple different times for similar chest pain. Dr. Eden Emms did not think her pain was cardiac and had not planned any further cardiac workup. She stated compliance with her medications.   Hospital Course: In the ED her cardiac enzymes were negative, CXR without any acute cardiopulmonary findings, and EKG unchanged from previous EKG. Her Ddimer was slightly elevated and a CTA chest completed which was negative for PE, but did note left lower lobe infiltrate as well as a stable low density 1.5cm nodule in the right lobe of the thyroid gland and a stable 5mm nodule in the left lower posterior lobe of the lung. She was initiated on antibiotics for pneumonia and observed in the CDU where an echo stress test was performed, however, it was nondiagnostic and could not rule out ischemia. She was admitted for further evaluation and treatment.  Cardiac  catheterization was performed on 09/16/11 which revealed preserved LV function, calcification of the coronaries, with scattered plaque, 90% small D2, in a vessel of 1-1.78mm, and 50% mid RCA. No interventions were performed and medical therapy was recommended. ASA, BB and statin were continued. She tolerated the procedure well without complications.  She had no further chest pain and was in stable condition for discharge. She will follow up with Dr. Eden Emms as scheduled below. She completed a 5 day course of azithromycin for community acquired pneumonia. She will need to follow up with her PCP for the pulmonary and thyroid nodules found on CT.  Discharge Vitals: Blood pressure 189/80, pulse 116, temperature 98 F (36.7 C), temperature source Oral, resp. rate 20, height 5\' 1"  (1.549 m), weight 160 lb 15 oz (73 kg), SpO2 100.00%.  Labs:  [facility]  09/16/11 0955  09/14/11 0545   NA  139  140   K  3.9  3.5   CL  101  102   CO2  27  27   GLUCOSE  128*  116*   BUN  16  18   CREATININE  0.86  0.91   CALCIUM  9.7  9.1    Basename  09/16/11 0955  09/13/11 1719   WBC  5.9  5.6   HGB  13.8  13.0   HCT  41.0  39.1   MCV  87.0  86.5   PLT  292  269     09/13/2011 07:57   Troponin i, poc  0.00     09/12/2011 12:34  09/12/2011 18:08   CK, MB  1.6  1.5   CK Total  59  56   Troponin I  <0.30  <0.30     09/12/2011 12:33   D-Dimer, Quant  0.65 (H)     Discharge Medications   Current Discharge Medication List    CONTINUE these medications which have CHANGED   Details  aspirin EC 81 MG EC tablet Take 1 tablet (81 mg total) by mouth daily.      CONTINUE these medications which have NOT CHANGED   Details  amLODipine (NORVASC) 10 MG tablet Take 10 mg by mouth daily.      atorvastatin (LIPITOR) 10 MG tablet Take 10 mg by mouth daily.      carvedilol (COREG) 25 MG tablet Take 25 mg by mouth 2 (two) times daily with a meal.      cetirizine (ZYRTEC) 10 MG tablet Take 10 mg by mouth daily.        losartan-hydrochlorothiazide (HYZAAR) 100-25 MG per tablet Take 1 tablet by mouth daily.      meloxicam (MOBIC) 7.5 MG tablet Take 7.5 mg by mouth daily.      pantoprazole (PROTONIX) 40 MG tablet Take 40 mg by mouth daily.          Disposition    Diet - low sodium heart healthy      Increase activity slowly      Discharge instructions      Comments:   **PLEASE REMEMBER TO BRING ALL OF YOUR MEDICATIONS TO EACH OF YOUR FOLLOW-UP OFFICE VISITS.   * KEEP GROIN SITE CLEAN AND DRY. Call the office for any signs of bleedings, pus, swelling, increased pain, or any other concerns. * NO HEAVY LIFTING (>10lbs) OR SEXUAL ACTIVITY X 7 DAYS. * NO DRIVING X 2-3 DAYS. * NO SOAKING BATHS, HOT TUBS, POOLS, ETC., X 7 DAYS.   Follow-up Information    Follow up with Charlton Haws, MD. (11:15 )    Contact information:   [facility] [address], [address] [place] [address] [phone]      Follow up with Your Primary Care Provider. Make an appointment in 2 weeks.     Outstanding Labs/Studies:  Follow up with PCP for the pulmonary and thyroid nodules found on CT.  Duration of Discharge Encounter: Greater than 30 minutes including physician and PA time.  Signed, Karisha Marlin PA-C 09/16/2011, 3:37 PM

## 2011-09-16 NOTE — Progress Notes (Signed)
Cardiology Progress Note Patient Name: Wendy Petty Date of Encounter: 09/16/2011, 11:06 AM     Subjective  No overnight events. Interpreter at bedside. Patient has been up to bathroom without difficulty. Denies chest pain, palpitations, shortness of breath, or other complaints.    Objective   Telemetry: Normal Sinus Rhythm 80s-100  Medications: . amLODipine  10 mg Oral Daily  . aspirin  324 mg Oral Pre-Cath  . aspirin EC  81 mg Oral Daily  . azithromycin (ZITHROMAX) 500 MG IVPB  500 mg Intravenous Q24H  . diazepam  5 mg Oral On Call  . enoxaparin  40 mg Subcutaneous Q24H  . hydrochlorothiazide  25 mg Oral Daily  . losartan  100 mg Oral Daily  . pantoprazole  40 mg Oral Daily  . sodium chloride  3 mL Intravenous Q12H  . sodium chloride  3 mL Intravenous Q12H    Physical Exam: Temp:  [97.9 F (36.6 C)-99.1 F (37.3 C)] 98 F (36.7 C) (01/07 0500) Pulse Rate:  [81-103] 88  (01/07 0500) Resp:  [20] 20  (01/07 0500) BP: (143-160)/(82-85) 146/85 mmHg (01/07 0953) SpO2:  [96 %-99 %] 96 % (01/07 0500)  General: Well developed, well nourished, Hispanic female, in no acute distress. Head: Normocephalic, atraumatic, sclera non-icteric, nares are without discharge.  Neck: Supple. Negative for carotid bruits or JVD Lungs: Clear bilaterally to auscultation without wheezes, rales, or rhonchi. Breathing is unlabored. Heart: RRR S1 S2 without murmurs, rubs, or gallops.  Abdomen: Soft, non-tender, non-distended with normoactive bowel sounds. No rebound/guarding. No obvious abdominal masses. Msk:  Strength and tone appear normal for age. Extremities: No edema. No clubbing or cyanosis. Distal pedal pulses are 2+ and equal bilaterally. Neuro: Alert and oriented X 3. Moves all extremities spontaneously. Psych:  Responds to questions appropriately with a normal affect.  Intake/Output Summary (Last 24 hours) at 09/16/11 1106 Last data filed at 09/16/11 0500  Gross per 24 hour    Intake    843 ml  Output      0 ml  Net    843 ml   Labs:  [facility] 09/16/11 0955 09/14/11 0545  NA 139 140  K 3.9 3.5  CL 101 102  CO2 27 27  GLUCOSE 128* 116*  BUN 16 18  CREATININE 0.86 0.91  CALCIUM 9.7 9.1   Basename 09/16/11 0955 09/13/11 1719  WBC 5.9 5.6  HGB 13.8 13.0  HCT 41.0 39.1  MCV 87.0 86.5  PLT 292 269     09/13/2011 07:57  Troponin i, poc 0.00    09/12/2011 12:34 09/12/2011 18:08  CK, MB 1.6 1.5  CK Total 59 56  Troponin I <0.30 <0.30     09/12/2011 12:33  D-Dimer, Quant 0.65 (H)    Radiology/Studies:   09/13/11 - Echocardiogram stress test - Stress ECG conclusions: No diagnostic abnormality. - Staged echo: At rest there is hypokinesis of the inferior wall. The EF is 55%. With stress the parasternal images are inadequate. The other stress images are difficult to interpret. Overall the study is nondiagnostic. Sometimes reading of the base of the inferior wall can be misleading. However. Wall motion at rest is not normal. I can not be sure about all walls with stress. I can not rule out ischemia.  Dg Chest 2 View 09/15/2011  Findings: Mild left basilar opacity, likely atelectasis.  Increased interstitial markings without frank interstitial edema.  Cardiomegaly.  Mild degenerative changes of the visualized thoracolumbar spine.  IMPRESSION:  Mild left basilar opacity, likely atelectasis.  Cardiomegaly.  No frank interstitial edema.   Ct Angio Chest W/cm &/or Wo Cm 09/12/2011  Findings:  Central airways are patent.  Again noted low density nodule within right lobe of thyroid gland measures 1.5 x 1.3 cm.  Extensive atherosclerotic calcifications of the thoracic aorta again noted.  No aortic aneurysm.  No pulmonary embolus is identified.  No mediastinal hematoma or adenopathy.  Borderline cardiomegaly noted.  No pericardial effusion.  Sagittal images of the spine shows degenerative changes thoracic spine.  Sagittal view of the sternum is unremarkable.  A probable cyst in  the anterior dome of the liver measures 1 cm stable in size and appearance from prior exam.  Probable cyst in the lateral segment of the left hepatic lobe measures 1.6 cm stable in size and appearance from prior exam.  No axillary adenopathy noted.  Central mild vascular congestion is noted.  There is small amount patchy atelectasis or infiltrate in the left lower lobe.  A nodule in left lower lobe posteriorly measures 5 mm stable in size and appearance from prior exam.  Nodular scarring in the right base anteriorly measures 4 mm stable in size and appearance from prior exam.  Minimal emphysematous changes in the right middle lobe and left lower lobe are stable.  Review of the MIP images confirms the above findings.  IMPRESSION:  1.  No pulmonary embolus is noted. 2.  Cardiomegaly is noted.  No pericardial effusion. 3.  Stable low density nodule in the right lobe of the thyroid gland measures about to 1.5 cm. 4.  Patchy small atelectasis or infiltrate in the left lower lobe. Stable 5 mm nodule in left lower lobe posteriorly.  Stable nodular scarring right base anteriorly. 5.  No pulmonary edema.      Assessment and Plan  71 y.o. female w/ HTN, but no history of CAD who presented to [facility] on 09/13/11 with complaints of chest pain that was concerning for angina.   1. Chest pain: Cardiac enzymes were negative, but the echo stress test was nondiagnostic and could not rule out ischemia so she is planned for cardiac cath this morning. She denies chest pain since admission. Cont ASA. Consider adding beta blocker if CAD found on cath. Check lipid panel and consider adding statin.  2. Hypertension: SBPs 140s-160s. On Norvasc10mg , HCTZ 25mg , and Losartan 100mg . Consider up titration of meds vs addition of BB after cath.  3. PULMONARY NODULE: There is mention on the CT scan of a very small pulmonary nodule that is said to be stable.  4. THYROID NODULE: There is mention on the chest CT of a thyroid  nodule that is said to be stable. TSH pending   Signed, HOPE, JESSICA PA-C  Patient seen with PA, agree with note.  90% stenosis in small D1.  Plan medical management.  She can go home today.  Would add statin, simvastatin 20 mg daily.   Marca Ancona 09/16/2011 1:40 PM

## 2011-09-16 NOTE — Op Note (Signed)
Cardiac Catheterization Procedure Note  Name: Wendy Petty MRN: [id number] DOB: Feb 09, 1941  Procedure: Left Heart Cath, Selective Coronary Angiography, LV angiography  Indication: Chest pain at rest   Procedural details: The right groin was prepped, draped, and anesthetized with 1% lidocaine. Using a smart needle with doppler, a 4 French sheath was introduced into the right femoral artery. Standard Judkins catheters were used for coronary angiography and left ventriculography. Catheter exchanges were performed over a guidewire. There were no immediate procedural complications. The patient was transferred to the post catheterization recovery area for further monitoring.  Procedural Findings: Hemodynamics:  AO 132/75 (101) LV 129/4   Coronary angiography: Coronary dominance: right  Left mainstem: There was a common ostium, like a funnel, and no significant narrowing.   Left anterior descending (LAD): Calcified and courses to the apex, wraps the apical tip, and supplies the distal inferior wall.  There is a mid stenosis of 40%, eccentric after the large third diagonal.  The small second diagonal has 90% osital narrowing, but is a very small caliber vessel.  The large D3 has 20-30% distal disease.   Left circumflex (LCx): Has a large bifurcating OM 1 and AV circ providing the PLA.  There is mild calcification, mild irregularity, and not critical narrowing.   Right coronary artery (RCA): Provides a large, single PDA.  Distally, and at the crux, is a long area of segmental 40-50% narrowing and 30% narrowing in the mid PDA.  IC NTG was given to get the best images.  Critical narrowing not noted.    Left ventriculography: Left ventricular systolic function is normal, LVEF is estimated at 55-65%, there is no significant mitral regurgitation   Final Conclusions:   1.  Preserved LV function 2.  Calcification of the coronaries, with scattered plaque as noted. 3.  90% small D2, in a vessel of  1-1.52mm 4.  50% mid RCA.  Recommendations:  1.  Medical therapy.  [name] 09/16/2011, 1:08 PM

## 2011-09-16 NOTE — Progress Notes (Signed)
09/16/11: Pt discharged to home per MD order. Pt VSS, no complaints of chest pain or shortness of breath.  Pt R groin site level zero. Pt received all discharge instructions including medications in spanish via RN interpreter.  Pt will call [facility] in am for follow up appointment date.  Efraim Kaufmann

## 2011-09-16 NOTE — H&P (View-Only) (Signed)
Patient ID: Wendy Petty, female   DOB: [birth year], 71 y.o.   MRN: [id number] SUBJECTIVE:  The patient is feeling well. She is not having any chest pain or shortness of breath. She is ready for cardiac catheterization tomorrow. Today at a different family member was in the room and helped with interpretation. I again answered many questions. The patient has a poor understanding of the evaluation but the family members appear to understand.   Filed Vitals:   09/14/11 1400 09/14/11 2100 09/15/11 0500 09/15/11 1028  BP: 116/64 123/73 120/64 123/71  Pulse: 77 87 92 73  Temp: 97.9 F (36.6 C) 98.1 F (36.7 C) 97.6 F (36.4 C)   TempSrc: Oral     Resp: 16 21 20    Height:      Weight:   160 lb 15 oz (73 kg)   SpO2: 98% 96% 96%     Intake/Output Summary (Last 24 hours) at 09/15/11 1205 Last data filed at 09/15/11 0900  Gross per 24 hour  Intake   1320 ml  Output    400 ml  Net    920 ml    LABS: Basic Metabolic Panel:  Basename 09/14/11 0545 09/13/11 1719 09/12/11 1233  NA 140 -- 141  K 3.5 -- 4.0  CL 102 -- 106  CO2 27 -- 28  GLUCOSE 116* -- 104*  BUN 18 -- 15  CREATININE 0.91 0.89 --  CALCIUM 9.1 -- 9.1  MG -- -- --  PHOS -- -- --   Liver Function Tests:  Basename 09/12/11 1233  AST 16  ALT 17  ALKPHOS 70  BILITOT 0.3  PROT 7.3  ALBUMIN 3.5    Basename 09/12/11 1233  LIPASE 48  AMYLASE --   CBC:  Basename 09/13/11 1719 09/12/11 1233  WBC 5.6 5.5  NEUTROABS -- 2.7  HGB 13.0 12.7  HCT 39.1 38.7  MCV 86.5 87.8  PLT 269 256   Cardiac Enzymes:  Basename 09/12/11 1808 09/12/11 1234  CKTOTAL 56 59  CKMB 1.5 1.6  CKMBINDEX -- --  TROPONINI <0.30 <0.30   BNP: No components found with this basename: POCBNP:3 D-Dimer:  Basename 09/12/11 1233  DDIMER 0.65*   Hemoglobin A1C: No results found for this basename: HGBA1C in the last 72 hours Fasting Lipid Panel: No results found for this basename: CHOL,HDL,LDLCALC,TRIG,CHOLHDL,LDLDIRECT in the last 72  hours Thyroid Function Tests: No results found for this basename: TSH,T4TOTAL,FREET3,T3FREE,THYROIDAB in the last 72 hours  RADIOLOGY: Dg Chest 2 View  09/15/2011  *RADIOLOGY REPORT*  Clinical Data: Follow up pneumonia  CHEST - 2 VIEW  Comparison: CT chest dated 09/12/2011.  Findings: Mild left basilar opacity, likely atelectasis.  Increased interstitial markings without frank interstitial edema.  Cardiomegaly.  Mild degenerative changes of the visualized thoracolumbar spine.  IMPRESSION: Mild left basilar opacity, likely atelectasis.  Cardiomegaly.  No frank interstitial edema.  Original Report Authenticated By: Charline Bills, M.D.   Dg Chest 2 View  09/12/2011  *RADIOLOGY REPORT*  Clinical Data: Chest pain.  CHEST - 2 VIEW  Comparison: 06/11/2011  Findings: The heart is mildly enlarged.  Aorta is tortuous and calcified.  Prominent interstitial markings appear chronic.  There are no focal consolidations or pleural effusions.  No evidence for pulmonary edema  IMPRESSION:  1.  Cardiomegaly. 2. No focal pulmonary abnormality.  Original Report Authenticated By: Patterson Hammersmith, M.D.   Ct Angio Chest W/cm &/or Wo Cm  09/12/2011  *RADIOLOGY REPORT*  Clinical Data:  Chest pain  CT  ANGIOGRAPHY CHEST WITH CONTRAST  Technique:  Multidetector CT imaging of the chest was performed using the standard protocol during bolus administration of intravenous contrast.  Multiplanar CT image reconstructions including MIPs were obtained to evaluate the vascular anatomy.  Contrast: OMNIPAQUE IOHEXOL 300 MG/ML IV SOLN pain  Comparison:  09/14/2007  Findings:  Central airways are patent.  Again noted low density nodule within right lobe of thyroid gland measures 1.5 x 1.3 cm.  Extensive atherosclerotic calcifications of the thoracic aorta again noted.  No aortic aneurysm.  No pulmonary embolus is identified.  No mediastinal hematoma or adenopathy.  Borderline cardiomegaly noted.  No pericardial effusion.  Sagittal  images of the spine shows degenerative changes thoracic spine.  Sagittal view of the sternum is unremarkable.  A probable cyst in the anterior dome of the liver measures 1 cm stable in size and appearance from prior exam.  Probable cyst in the lateral segment of the left hepatic lobe measures 1.6 cm stable in size and appearance from prior exam.  No axillary adenopathy noted.  Central mild vascular congestion is noted.  There is small amount patchy atelectasis or infiltrate in the left lower lobe.  A nodule in left lower lobe posteriorly measures 5 mm stable in size and appearance from prior exam.  Nodular scarring in the right base anteriorly measures 4 mm stable in size and appearance from prior exam.  Minimal emphysematous changes in the right middle lobe and left lower lobe are stable.  Review of the MIP images confirms the above findings.  IMPRESSION:  1.  No pulmonary embolus is noted. 2.  Cardiomegaly is noted.  No pericardial effusion. 3.  Stable low density nodule in the right lobe of the thyroid gland measures about to 1.5 cm. 4.  Patchy small atelectasis or infiltrate in the left lower lobe. Stable 5 mm nodule in left lower lobe posteriorly.  Stable nodular scarring right base anteriorly. 5.  No pulmonary edema.  Original Report Authenticated By: Natasha Mead, M.D.    PHYSICAL EXAM  Patient is oriented to person time and place through interpretation. Lungs are clear. Respiratory effort is nonlabored. Cardiac exam reveals S1 and S2. There no clicks or significant murmurs.   TELEMETRY: I have reviewed telemetry. There is normal sinus rhythm.   ASSESSMENT AND PLAN:  Active Problems:  THYROID NODULE  HYPERLIPIDEMIA  ANEMIA, CHRONIC  HYPERTENSION  PULMONARY NODULE  Edema   CHEST PAIN UNSPECIFIED    Patient is ready for cardiac catheterization. The orders have been written.   Willa Rough 09/15/2011 12:05 PM

## 2011-09-16 NOTE — Interval H&P Note (Signed)
History and Physical Interval Note:  09/16/2011 12:03 PM  Wendy Petty  has presented today for surgery, with the diagnosis of c/p .  I met with a large family on Friday, and we covered the issues and discussed in detail with an interpreter.  We reviewed this, and she was willing to proceed.  Risks were discussed and they were aware.  The many family members consented, more importantly, the patient was in agreement to proceed after it was explained by the interpreter.  Findings are as noted.    [name] 12:05 PM 09/16/2011  Anesthesia Post Note

## 2011-09-17 LAB — HEMOGLOBIN A1C: Hgb A1c MFr Bld: 6.6 % — ABNORMAL HIGH (ref <5.7)

## 2011-09-26 ENCOUNTER — Encounter: Payer: Self-pay | Admitting: *Deleted

## 2011-09-26 ENCOUNTER — Encounter: Payer: Self-pay | Admitting: Physician Assistant

## 2011-09-27 ENCOUNTER — Encounter: Payer: 59 | Admitting: Physician Assistant

## 2011-10-03 ENCOUNTER — Encounter: Payer: Self-pay | Admitting: Cardiovascular Disease

## 2011-10-03 ENCOUNTER — Ambulatory Visit (INDEPENDENT_AMBULATORY_CARE_PROVIDER_SITE_OTHER): Payer: Medicaid Other | Admitting: Cardiovascular Disease

## 2011-10-03 DIAGNOSIS — I10 Essential (primary) hypertension: Secondary | ICD-10-CM

## 2011-10-03 DIAGNOSIS — E785 Hyperlipidemia, unspecified: Secondary | ICD-10-CM

## 2011-10-03 DIAGNOSIS — R079 Chest pain, unspecified: Secondary | ICD-10-CM

## 2011-10-03 NOTE — Assessment & Plan Note (Signed)
Cholesterol is at goal.  Continue current dose of statin and diet Rx.  No myalgias or side effects.  F/U  LFT's in 6 months. Lab Results  Component Value Date   LDLCALC 73 09/16/2011

## 2011-10-03 NOTE — Assessment & Plan Note (Signed)
Atypical noncardiac See cath note above.  ASA

## 2011-10-03 NOTE — Progress Notes (Signed)
Due to language barrier, an interpreter was present during the history-taking and subsequent discussion (and for part of the physical exam) with this patient. Interpreter Talor Cheema Namihira 11.00Dr Eden Emms 10/03/2011

## 2011-10-03 NOTE — Assessment & Plan Note (Signed)
Well controlled.  Continue current medications and low sodium Dash type diet.    

## 2011-10-03 NOTE — Progress Notes (Signed)
71 yo recently hospitalized with pnemonia and SSCP  See cath results below.  Previously seen for atypical chest pain HTN and elevated lipids.  CT negative for PE in hospital.  Has stable thyroid nodule.  Pneumonia Rx with antibiotics.  Interview with intepreter.  Numerous somatic complaints none of which have to do with the heart.  Muscular pain in back.  Neuropathic pain in legs.    Cath Stuckey 09/16/11  Medical RX only small diagonal disease  Final Conclusions:  1. Preserved LV function  2. Calcification of the coronaries, with scattered plaque as noted.  3. 90% small D2, in a vessel of 1-1.47mm  4. 50% mid RCA.  Recommendations:  1. Medical therapy.   ROS: Denies fever, malais, weight loss, blurry vision, decreased visual acuity, cough, sputum, SOB, hemoptysis, pleuritic pain, palpitaitons, heartburn, abdominal pain, melena, lower extremity edema, claudication, or rash.  All other systems reviewed and negative  General: Affect appropriate Healthy:  appears stated age HEENT: normal Neck supple with no adenopathy JVP normal no bruits no thyromegaly Lungs clear with no wheezing and good diaphragmatic motion Heart:  S1/S2 no murmur, no rub, gallop or click PMI normal Abdomen: benighn, BS positve, no tenderness, no AAA no bruit.  No HSM or HJR Distal pulses intact with no bruits No edema Neuro non-focal Skin warm and dry Cath site healed well No muscular weakness   Current Outpatient Prescriptions  Medication Sig Dispense Refill  . amLODipine (NORVASC) 10 MG tablet Take 10 mg by mouth daily.        Marland Kitchen aspirin EC 81 MG EC tablet Take 1 tablet (81 mg total) by mouth daily.      Marland Kitchen atorvastatin (LIPITOR) 10 MG tablet Take 10 mg by mouth daily.        . carvedilol (COREG) 25 MG tablet Take 25 mg by mouth 2 (two) times daily with a meal.        . cetirizine (ZYRTEC) 10 MG tablet Take 10 mg by mouth daily.        Marland Kitchen losartan-hydrochlorothiazide (HYZAAR) 100-25 MG per tablet Take 1 tablet  by mouth daily.        . meloxicam (MOBIC) 7.5 MG tablet Take 7.5 mg by mouth daily.        . pantoprazole (PROTONIX) 40 MG tablet Take 40 mg by mouth daily.          Allergies  Review of patient's allergies indicates no known allergies.  Electrocardiogram:  Assessment and Plan

## 2011-10-03 NOTE — Patient Instructions (Signed)
Your physician wants you to follow-up in: 1 year. You will receive a reminder letter in the mail two months in advance. If you don't receive a letter, please call our office to schedule the follow-up appointment.  

## 2011-10-21 ENCOUNTER — Other Ambulatory Visit (HOSPITAL_COMMUNITY): Payer: Self-pay | Admitting: Family Medicine

## 2011-10-21 DIAGNOSIS — Z1231 Encounter for screening mammogram for malignant neoplasm of breast: Secondary | ICD-10-CM

## 2011-10-21 DIAGNOSIS — Z780 Asymptomatic menopausal state: Secondary | ICD-10-CM

## 2011-11-18 ENCOUNTER — Ambulatory Visit (HOSPITAL_COMMUNITY)
Admission: RE | Admit: 2011-11-18 | Discharge: 2011-11-18 | Disposition: A | Discharge status: Home / Self Care | Payer: Self-pay | Source: Ambulatory Visit | Attending: Family Medicine | Admitting: Family Medicine

## 2011-11-18 DIAGNOSIS — Z780 Asymptomatic menopausal state: Secondary | ICD-10-CM | POA: Insufficient documentation

## 2011-11-18 DIAGNOSIS — Z1231 Encounter for screening mammogram for malignant neoplasm of breast: Secondary | ICD-10-CM

## 2011-11-20 ENCOUNTER — Other Ambulatory Visit: Payer: Self-pay | Admitting: Family Medicine

## 2011-11-20 DIAGNOSIS — R928 Other abnormal and inconclusive findings on diagnostic imaging of breast: Secondary | ICD-10-CM

## 2011-11-26 ENCOUNTER — Ambulatory Visit
Admission: RE | Admit: 2011-11-26 | Discharge: 2011-11-26 | Disposition: A | Discharge status: Home / Self Care | Payer: Self-pay | Source: Ambulatory Visit | Attending: Family Medicine | Admitting: Family Medicine

## 2011-11-26 DIAGNOSIS — R928 Other abnormal and inconclusive findings on diagnostic imaging of breast: Secondary | ICD-10-CM

## 2012-06-25 ENCOUNTER — Telehealth: Payer: Self-pay | Admitting: Cardiovascular Disease

## 2012-06-25 NOTE — Telephone Encounter (Signed)
New Problem:     I sent a reminder and the letter was sent back stating that it was unable to be delivered to the patient's address.  I called the patient and was unable to reach them. I left a message on their voicemail with my name, the reason I called, the name of his physician, and a number to call back to schedule their appointment.

## 2012-12-31 ENCOUNTER — Emergency Department (HOSPITAL_COMMUNITY)
Admission: EM | Admit: 2012-12-31 | Discharge: 2012-12-31 | Disposition: A | Discharge status: Home / Self Care | Payer: No Typology Code available for payment source | Source: Home / Self Care | Attending: Family Medicine | Admitting: Family Medicine

## 2012-12-31 ENCOUNTER — Encounter (HOSPITAL_COMMUNITY): Payer: Self-pay | Admitting: *Deleted

## 2012-12-31 DIAGNOSIS — I509 Heart failure, unspecified: Secondary | ICD-10-CM

## 2012-12-31 DIAGNOSIS — F329 Major depressive disorder, single episode, unspecified: Secondary | ICD-10-CM | POA: Diagnosis present

## 2012-12-31 DIAGNOSIS — F32A Depression, unspecified: Secondary | ICD-10-CM

## 2012-12-31 DIAGNOSIS — I10 Essential (primary) hypertension: Secondary | ICD-10-CM

## 2012-12-31 DIAGNOSIS — D539 Nutritional anemia, unspecified: Secondary | ICD-10-CM

## 2012-12-31 DIAGNOSIS — E785 Hyperlipidemia, unspecified: Secondary | ICD-10-CM

## 2012-12-31 HISTORY — DX: Depression, unspecified: F32.A

## 2012-12-31 LAB — COMPREHENSIVE METABOLIC PANEL
AST: 16 U/L (ref 0–37)
BUN: 28 mg/dL — ABNORMAL HIGH (ref 6–23)
CO2: 25 mEq/L (ref 19–32)
Chloride: 104 mEq/L (ref 96–112)
Creatinine, Ser: 1.04 mg/dL (ref 0.50–1.10)
GFR calc non Af Amer: 53 mL/min — ABNORMAL LOW (ref >90)
Glucose, Bld: 106 mg/dL — ABNORMAL HIGH (ref 70–99)
Total Bilirubin: 0.2 mg/dL — ABNORMAL LOW (ref 0.3–1.2)

## 2012-12-31 LAB — LIPID PANEL
LDL Cholesterol: UNDETERMINED mg/dL (ref 0–99)
Triglycerides: 474 mg/dL — ABNORMAL HIGH (ref <150)

## 2012-12-31 LAB — CBC
HCT: 38.6 % (ref 36.0–46.0)
Hemoglobin: 13.3 g/dL (ref 12.0–15.0)
MCV: 87.5 fL (ref 78.0–100.0)
Platelets: 263 10*3/uL (ref 150–400)
RBC: 4.41 MIL/uL (ref 3.87–5.11)
WBC: 5.6 10*3/uL (ref 4.0–10.5)

## 2012-12-31 MED ORDER — FLUOXETINE HCL 20 MG PO CAPS: 20.0000 mg | ORAL_CAPSULE | Freq: Every day | ORAL | Status: DC

## 2012-12-31 MED ORDER — SPIRONOLACTONE 25 MG PO TABS: 25.0000 mg | ORAL_TABLET | Freq: Every day | ORAL | Status: DC

## 2012-12-31 MED ORDER — ATORVASTATIN CALCIUM 10 MG PO TABS: 10.0000 mg | ORAL_TABLET | Freq: Every day | ORAL | Status: DC

## 2012-12-31 MED ORDER — AMLODIPINE BESYLATE 5 MG PO TABS: 5.0000 mg | ORAL_TABLET | Freq: Every day | ORAL | Status: DC

## 2012-12-31 MED ORDER — CLOPIDOGREL BISULFATE 75 MG PO TABS: 75.0000 mg | ORAL_TABLET | Freq: Every day | ORAL | Status: DC

## 2012-12-31 MED ORDER — CARVEDILOL 12.5 MG PO TABS: 12.5000 mg | ORAL_TABLET | Freq: Two times a day (BID) | ORAL | Status: DC

## 2012-12-31 MED ORDER — VALSARTAN-HYDROCHLOROTHIAZIDE 320-25 MG PO TABS: 1.0000 | ORAL_TABLET | Freq: Every day | ORAL | Status: DC

## 2012-12-31 NOTE — ED Provider Notes (Addendum)
History     CSN: [id number]  Arrival date & time 12/31/12  1500   First MD Initiated Contact with Patient 12/31/12 1516     CC:  Establish and get meds refilled   HPI Pt is here to establish, She has been in the [place] for nearly 13 months.  She says that she is back here now.  She says that she needs her meds refilled.  She is not having any acute symptoms. She says that she did have a stroke last year in [place] and was started on Plavix.  She is taking her medications regularly as prescribed.  Pt says that she is crying all the time spontaneously and for the past 6 months has been depressed, no desire to harm herself or others.    Past Medical History  Diagnosis Date  . Hypertension   . GERD (gastroesophageal reflux disease)   . HYPERLIPIDEMIA   . THYROID NODULE   . PULMONARY NODULE   . Edema   . CHEST PAIN UNSPECIFIED   . ANEMIA, CHRONIC     Past Surgical History  Procedure Laterality Date  . Vaginal hysterectomy  1989  . Tubal ligation      History reviewed. No pertinent family history.  History  Substance Use Topics  . Smoking status: Never Smoker   . Smokeless tobacco: Never Used  . Alcohol Use: No    OB History   Grav Para Term Preterm Abortions TAB SAB Ect Mult Living                  Review of Systems Constitutional: Negative.  HENT: Negative.  Respiratory: Negative.  Cardiovascular: Negative.  Gastrointestinal: Negative.  Endocrine: Negative.  Genitourinary: Negative.  Musculoskeletal: Negative.  Skin: Negative.  Allergic/Immunologic: Negative.  Neurological: Negative.  Hematological: Negative.  Psychiatric/Behavioral: Negative.  All other systems reviewed and are negative   Allergies  Review of patient's allergies indicates no known allergies.  Home Medications   Current Outpatient Rx  Name  Route  Sig  Dispense  Refill  . amLODipine (NORVASC) 10 MG tablet   Oral   Take 10 mg by mouth daily.           Marland Kitchen atorvastatin (LIPITOR)  10 MG tablet   Oral   Take 10 mg by mouth daily.           . carvedilol (COREG) 25 MG tablet   Oral   Take 25 mg by mouth 2 (two) times daily with a meal.           . cetirizine (ZYRTEC) 10 MG tablet   Oral   Take 10 mg by mouth daily.           Marland Kitchen losartan-hydrochlorothiazide (HYZAAR) 100-25 MG per tablet   Oral   Take 1 tablet by mouth daily.           . meloxicam (MOBIC) 7.5 MG tablet   Oral   Take 7.5 mg by mouth daily.           . pantoprazole (PROTONIX) 40 MG tablet   Oral   Take 40 mg by mouth daily.             BP 137/104  Pulse 79  Temp(Src) 97.7 F (36.5 C) (Oral)  Resp 20  SpO2 100%  Physical Exam Nursing note and vitals reviewed.  Constitutional: Fatigue.  She is oriented to person, place, and time. She appears well-developed and well-nourished. No distress.  HENT:  Head: Normocephalic  and atraumatic.  Eyes: Conjunctivae and EOM are normal. Pupils are equal, round, and reactive to light.  Neck: Normal range of motion. Neck supple. No JVD present. No tracheal deviation present. No thyromegaly present.  Cardiovascular: Normal rate, regular rhythm and normal heart sounds.  Pulmonary/Chest: Effort normal and breath sounds normal. No respiratory distress. She has no wheezes.  Abdominal: Soft. Bowel sounds are normal.  Musculoskeletal: Normal range of motion. She exhibits no edema and no tenderness.  Lymphadenopathy:  She has no cervical adenopathy.  Neurological: She is alert and oriented to person, place, and time. She has normal reflexes.  Skin: Skin is warm and dry.  Psychiatric: She has a normal mood and affect. Her behavior is normal. Judgment and thought content normal.   ED Course  Procedures (including critical care time)  Labs Reviewed - No data to display No results found.  No diagnosis found.  MDM  IMPRESSION  Hypertension  GERD  Dyslipidemia  CHF  Cerebrovascular Disease s/p CVA  Depression  Major   RECOMMENDATIONS  / PLAN Refilled her medications today. She is only taking carvedilol 12.5 mg once per day, but says that this is what she was told by her doctor in [place].  Refer her back to See Dr. Eden Emms to follow up on her CHF Check labs today  Daily weights.  Low sodium diet recommended.  Trial of prozac 20 mg po daily Refer to behavioral health for counseling services  FOLLOW UP 1 month   The patient was given clear instructions to go to ER or return to medical center if symptoms don't improve, worsen or new problems develop.  The patient verbalized understanding.  The patient was told to call to get lab results if they haven't heard anything in the next week.            Cleora Fleet, MD 12/31/12 1542  [place] Cyndie Mull, MD 12/31/12 [phone]

## 2013-01-01 LAB — VITAMIN D 25 HYDROXY (VIT D DEFICIENCY, FRACTURES): Vit D, 25-Hydroxy: 27 ng/mL — ABNORMAL LOW (ref 30–89)

## 2013-01-01 MED ORDER — AMLODIPINE BESYLATE 5 MG PO TABS: 5.0000 mg | ORAL_TABLET | Freq: Every day | ORAL | Status: DC

## 2013-01-01 MED ORDER — CLOPIDOGREL BISULFATE 75 MG PO TABS: 75.0000 mg | ORAL_TABLET | Freq: Every day | ORAL | Status: DC

## 2013-01-01 MED ORDER — SPIRONOLACTONE 25 MG PO TABS: 25.0000 mg | ORAL_TABLET | Freq: Every day | ORAL | Status: DC

## 2013-01-01 MED ORDER — VALSARTAN-HYDROCHLOROTHIAZIDE 320-25 MG PO TABS: 1.0000 | ORAL_TABLET | Freq: Every day | ORAL | Status: DC

## 2013-01-01 MED ORDER — CARVEDILOL 12.5 MG PO TABS: 12.5000 mg | ORAL_TABLET | Freq: Two times a day (BID) | ORAL | Status: DC

## 2013-01-01 MED ORDER — ATORVASTATIN CALCIUM 10 MG PO TABS: 10.0000 mg | ORAL_TABLET | Freq: Every day | ORAL | Status: DC

## 2013-01-01 MED ORDER — FLUOXETINE HCL 20 MG PO CAPS: 20.0000 mg | ORAL_CAPSULE | Freq: Every day | ORAL | Status: DC

## 2013-01-01 NOTE — Progress Notes (Signed)
Quick Note:  Please inform patient that her blood sugar was elevated and her A1c was elevated suggesting that she has prediabetes. Her cholesterol levels were elevated. She needs to start taking the lipitor again (prescription was given in office yesterday). Please follow up with cardiology as we have recommended. Please mail patient information on prediabetes diet and physical activity recommendations. Recommend rechecking labs in 3 months.   Rodney Langton, MD, CDE, FAAFP [facility] [place], [place]   ______

## 2013-01-01 NOTE — ED Notes (Signed)
01/01/13 Patient returned to clinic today stated she had lost one page of her prescriptions Dr. Laural Benes made aware and duplication was printed and given to patient and grand daughter.

## 2013-01-04 ENCOUNTER — Telehealth (HOSPITAL_COMMUNITY): Payer: Self-pay | Admitting: *Deleted

## 2013-01-05 NOTE — ED Notes (Signed)
Patient has appt 01/11/13 [facility]

## 2013-01-11 ENCOUNTER — Ambulatory Visit (INDEPENDENT_AMBULATORY_CARE_PROVIDER_SITE_OTHER): Payer: No Typology Code available for payment source | Admitting: Physician Assistant

## 2013-01-11 ENCOUNTER — Encounter: Payer: Self-pay | Admitting: Physician Assistant

## 2013-01-11 VITALS — BP 147/94 | HR 77 | Wt 147.8 lb

## 2013-01-11 DIAGNOSIS — I509 Heart failure, unspecified: Secondary | ICD-10-CM

## 2013-01-11 DIAGNOSIS — R079 Chest pain, unspecified: Secondary | ICD-10-CM

## 2013-01-11 DIAGNOSIS — I10 Essential (primary) hypertension: Secondary | ICD-10-CM

## 2013-01-11 NOTE — Assessment & Plan Note (Signed)
No recent chest pain. Cardiac catheterization last year revealed nonobstructive coronary disease see catheter below Cath Stuckey 09/16/11  Medical RX only small diagonal disease  Final Conclusions:   1. Preserved LV function   2. Calcification of the coronaries, with scattered plaque as noted.   3. 90% small D2, in a vessel of 1-1.39mm   4. 50% mid RCA.   Recommendations:   1. Medical therapy.

## 2013-01-11 NOTE — Assessment & Plan Note (Signed)
Patient's blood pressure is elevated today but she has not taken any medications. She is having some dizziness when she takes all her medications at once. I've asked her to obtain a blood pressure cuff and keep daily track of her blood pressures. Will see her back and reassess her medications and blood pressures. 2 g sodium diet.

## 2013-01-11 NOTE — Patient Instructions (Addendum)
**Note De-Identified  Obfuscation** Your provider recommends that you start a 2 gram sodium diet. Please see handout given to you at todays office visit.  Your physician has requested that you regularly monitor and record your blood pressure readings at home. Please use the same machine at the same time of day to check your readings and record them to bring to your follow-up visit. Please check blood pressure twice daily (once in the morning and once in the evening) and bring readings with you to your next office visit with [place].  Your physician recommends that you schedule a follow-up appointment in: 1 month

## 2013-01-11 NOTE — Progress Notes (Signed)
HPI:   This is a 72 year old woman who recently moved back from [place] and was referred to [place] from urgent care for cardiac followup of elevated BP. She saw Dr. Eden Emms in January 2013 and was doing well.Cardiac catheterization earlier that month by Dr. Riley Kill revealed 90% small diagonal 2 and a vessel of 1-1.5 mm, 50% mid RCA other coronaries were scattered plaque normal LV function. Medical therapy was recommended. She also has history of hypertension and hyperlipidemia.She has history of prior CVA, GERD, and depression.  The patient is here today with an interpreter as well as her granddaughter. She states that if she takes all her medications at once she gets dizzy but if she staggers them, she does not get dizzy and she does tolerates them. She did not take any of her medications this morning and her blood pressure is up a little. She says when she goes to the pharmacy to get her blood pressure checked it is usually okay. She has a blood pressure cuff in [place] but not here. She just came back from [place] recently.She states she watches her diet closely and does not eat any high salt foods or packaged foods. Her granddaughter verifies this and says she cooks only fresh and does not add salt.  Patient denies any chest pain, palpitations, dyspnea, dyspnea on exertion, or presyncope.    No Known Allergies  Current Outpatient Prescriptions on File Prior to Visit: amLODipine (NORVASC) 5 MG tablet, Take 1 tablet (5 mg total) by mouth daily., Disp: 30 tablet, Rfl: 4 atorvastatin (LIPITOR) 10 MG tablet, Take 1 tablet (10 mg total) by mouth daily., Disp: 30 tablet, Rfl: 4 carvedilol (COREG) 12.5 MG tablet, Take 1 tablet (12.5 mg total) by mouth 2 (two) times daily with a meal., Disp: 60 tablet, Rfl: 4 clopidogrel (PLAVIX) 75 MG tablet, Take 1 tablet (75 mg total) by mouth daily., Disp: 30 tablet, Rfl: 4 FLUoxetine (PROZAC) 20 MG capsule, Take 1 capsule (20 mg total) by mouth daily., Disp: 30  capsule, Rfl: 3 spironolactone (ALDACTONE) 25 MG tablet, Take 1 tablet (25 mg total) by mouth daily., Disp: 30 tablet, Rfl: 4 valsartan-hydrochlorothiazide (DIOVAN HCT) 320-25 MG per tablet, Take 1 tablet by mouth daily., Disp: 30 tablet, Rfl: 4 [DISCONTINUED] cetirizine (ZYRTEC) 10 MG tablet, Take 10 mg by mouth daily.  , Disp: , Rfl:  [DISCONTINUED] losartan-hydrochlorothiazide (HYZAAR) 100-25 MG per tablet, Take 1 tablet by mouth daily.  , Disp: , Rfl:  [DISCONTINUED] pantoprazole (PROTONIX) 40 MG tablet, Take 40 mg by mouth daily.  , Disp: , Rfl:   No current facility-administered medications on file prior to visit.   Past Medical History:   Hypertension                                                 GERD (gastroesophageal reflux disease)                       HYPERLIPIDEMIA                                               THYROID NODULE  PULMONARY NODULE                                             Edema                                                        CHEST PAIN UNSPECIFIED                                       ANEMIA, CHRONIC                                             Past Surgical History:   VAGINAL HYSTERECTOMY                             1989         TUBAL LIGATION                                               No family history on file.   Social History   Marital Status: Married             Spouse Name:                      Years of Education:                 Number of children:             Occupational History   None on file  Social History Main Topics   Smoking Status: Never Smoker                     Smokeless Status: Never Used                       Alcohol Use: No             Drug Use: No             Sexual Activity: No                 Other Topics            Concern   None on file  Social History Narrative   None on file    ROS:see history of present illness otherwise negative   PHYSICAL EXAM:  Well-nournished, in no acute distress. Neck: No JVD, HJR, Bruit, or thyroid enlargement  Lungs: No tachypnea, clear without wheezing, rales, or rhonchi  Cardiovascular: RRR, PMI not displaced, heart sounds normal, no murmurs, gallops, bruit, thrill, or heave.  Abdomen: BS normal. Soft without organomegaly, masses, lesions or tenderness.  Extremities: without cyanosis, clubbing or edema. Good distal pulses bilateral  SKin: Warm, no lesions or rashes   Musculoskeletal: No deformities  Neuro: no focal signs  BP  147/94  Pulse 77  Wt 147 lb 12.8 oz (67.042 kg)  BMI 27.94 kg/m2    ZOX:WRUEAV sinus rhythm with nonspecific T-wave changes, no acute change   Cath Stuckey 09/16/11  Medical RX only small diagonal disease  Final Conclusions:   1. Preserved LV function   2. Calcification of the coronaries, with scattered plaque as noted.   3. 90% small D2, in a vessel of 1-1.12mm   4. 50% mid RCA.   Recommendations:   1. Medical therapy.

## 2013-01-11 NOTE — Assessment & Plan Note (Signed)
No evidence of heart failure and patient has normal EF.

## 2013-02-03 ENCOUNTER — Ambulatory Visit: Payer: No Typology Code available for payment source | Admitting: Physician Assistant

## 2013-04-09 ENCOUNTER — Ambulatory Visit: Payer: No Typology Code available for payment source

## 2013-04-22 ENCOUNTER — Encounter (HOSPITAL_COMMUNITY): Payer: Self-pay | Admitting: Emergency Medicine

## 2013-04-22 ENCOUNTER — Emergency Department (HOSPITAL_COMMUNITY)
Admission: EM | Admit: 2013-04-22 | Discharge: 2013-04-22 | Disposition: A | Discharge status: Home / Self Care | Payer: PRIVATE HEALTH INSURANCE | Source: Home / Self Care

## 2013-04-22 DIAGNOSIS — I10 Essential (primary) hypertension: Secondary | ICD-10-CM

## 2013-04-22 DIAGNOSIS — Z8673 Personal history of transient ischemic attack (TIA), and cerebral infarction without residual deficits: Secondary | ICD-10-CM

## 2013-04-22 DIAGNOSIS — R079 Chest pain, unspecified: Secondary | ICD-10-CM

## 2013-04-22 DIAGNOSIS — E039 Hypothyroidism, unspecified: Secondary | ICD-10-CM

## 2013-04-22 MED ORDER — LEVOTHYROXINE SODIUM 25 MCG PO TABS: 25.0000 ug | ORAL_TABLET | Freq: Every day | ORAL | Status: DC

## 2013-04-22 MED ORDER — CLOPIDOGREL BISULFATE 75 MG PO TABS: 75.0000 mg | ORAL_TABLET | Freq: Every day | ORAL | Status: DC

## 2013-04-22 MED ORDER — VALSARTAN-HYDROCHLOROTHIAZIDE 320-25 MG PO TABS: 1.0000 | ORAL_TABLET | Freq: Every day | ORAL | Status: DC

## 2013-04-22 MED ORDER — SPIRONOLACTONE 25 MG PO TABS: 25.0000 mg | ORAL_TABLET | Freq: Every day | ORAL | Status: DC

## 2013-04-22 MED ORDER — NITROGLYCERIN 0.4 MG SL SUBL: 0.4000 mg | SUBLINGUAL_TABLET | SUBLINGUAL | Status: DC | PRN

## 2013-04-22 NOTE — ED Provider Notes (Signed)
CSN: [id number]     Arrival date & time 04/22/13  1019 History     First MD Initiated Contact with Patient 04/22/13 1053     Chief Complaint  Patient presents with  . Medication Refill   (Consider location/radiation/quality/duration/timing/severity/associated sxs/prior Treatment) HPI Comments: 72 year old female is accompanied by her daughter to the urgent care for medication refills. She also  mentions that 2 days ago she had an episode of midsternal pain and left arm numbness that lasted for one to 2 minutes. Since that time she has been asymptomatic. She has a history of hypertension, dyslipidemia, CVA, hypothyroidism, edema, chest pain, anemia and GERD. She [facility] and is uncertain as to why she came here. She has been to [place] and back once or twice in the past few months. Her last medications were written by physician while in [place]. She is requesting refills on all of her chronic medications.   Past Medical History  Diagnosis Date  . Hypertension   . GERD (gastroesophageal reflux disease)   . HYPERLIPIDEMIA   . THYROID NODULE   . PULMONARY NODULE   . Edema   . CHEST PAIN UNSPECIFIED   . ANEMIA, CHRONIC    Past Surgical History  Procedure Laterality Date  . Vaginal hysterectomy  1989  . Tubal ligation     No family history on file. History  Substance Use Topics  . Smoking status: Never Smoker   . Smokeless tobacco: Never Used  . Alcohol Use: No   OB History   Grav Para Term Preterm Abortions TAB SAB Ect Mult Living                 Review of Systems  Constitutional: Negative.   HENT: Negative.   Respiratory: Positive for shortness of breath.   Cardiovascular: Positive for chest pain. Negative for palpitations and leg swelling.  Gastrointestinal: Negative.   Genitourinary: Negative.   Skin: Negative for rash.  Neurological: Positive for numbness.    Allergies  Review of patient's allergies indicates  no known allergies.  Home Medications   Current Outpatient Rx  Name  Route  Sig  Dispense  Refill  . amLODipine (NORVASC) 5 MG tablet   Oral   Take 1 tablet (5 mg total) by mouth daily.   30 tablet   4   . atorvastatin (LIPITOR) 10 MG tablet   Oral   Take 1 tablet (10 mg total) by mouth daily.   30 tablet   4   . carvedilol (COREG) 12.5 MG tablet   Oral   Take 1 tablet (12.5 mg total) by mouth 2 (two) times daily with a meal.   60 tablet   4   . clopidogrel (PLAVIX) 75 MG tablet   Oral   Take 1 tablet (75 mg total) by mouth daily.   30 tablet   4   . clopidogrel (PLAVIX) 75 MG tablet   Oral   Take 1 tablet (75 mg total) by mouth daily.   30 tablet   0   . FLUoxetine (PROZAC) 20 MG capsule   Oral   Take 1 capsule (20 mg total) by mouth daily.   30 capsule   3   . levothyroxine (SYNTHROID, LEVOTHROID) 25 MCG tablet   Oral   Take 1 tablet (25 mcg total) by mouth daily before breakfast.   30 tablet   0   . nitroGLYCERIN (NITROSTAT) 0.4 MG SL tablet   Sublingual  Place 1 tablet (0.4 mg total) under the tongue every 5 (five) minutes as needed for chest pain.   15 tablet   0   . spironolactone (ALDACTONE) 25 MG tablet   Oral   Take 1 tablet (25 mg total) by mouth daily.   30 tablet   4   . spironolactone (ALDACTONE) 25 MG tablet   Oral   Take 1 tablet (25 mg total) by mouth daily.   30 tablet   0   . valsartan-hydrochlorothiazide (DIOVAN-HCT) 320-25 MG per tablet   Oral   Take 1 tablet by mouth daily.   30 tablet   0    BP 117/69  Pulse 80  Temp(Src) 97.7 F (36.5 C) (Oral)  Resp 18  SpO2 99% Physical Exam  Nursing note and vitals reviewed. Constitutional: She is oriented to person, place, and time. She appears well-developed and well-nourished. No distress.  Appears to be generally well, no acute distress, relaxed posturing, asymptomatic.  HENT:  Mouth/Throat: Oropharynx is clear and moist. No oropharyngeal exudate.  Eyes: Conjunctivae  and EOM are normal.  Neck: Neck supple. No tracheal deviation present.  Cardiovascular: Normal rate, regular rhythm and normal heart sounds.   No murmur heard. Pulmonary/Chest: Effort normal. No respiratory distress. She has no wheezes. She has no rales.  Abdominal: Soft. There is no tenderness.  Musculoskeletal: She exhibits no edema and no tenderness.  Lymphadenopathy:    She has no cervical adenopathy.  Neurological: She is alert and oriented to person, place, and time. She exhibits normal muscle tone.  Skin: Skin is warm and dry. No rash noted. She is not diaphoretic. No erythema.  Psychiatric: She has a normal mood and affect.    ED Course   Procedures (including critical care time) EKG normal sinus rhythm. No ectopy. Inverted T waves in V2. Early repolarization of S T.-segment in lead 2 , aVF and V6.  Labs Reviewed - No data to display No results found. 1. Chest pain   2. Hypertension   3. Hypothyroidism   4. History of CVA (cerebrovascular accident)     MDM  Patient is asymptomatic at this time and in no acute distress. She is stable and is discharged home. I refilled her medications that were given to me by her daughter. These include Diovan HCT, Aldactone 25 mg, Synthroid 25 mcg, Plavix 75 mg. Also she was given nitroglycerin 0.4 mg sublingual every 5 minutes up to 3 doses for chest pain. She is instructed to go to the emergency department if she continues to have chest pain with these medications. She is also instructed that this medicine can calls a decrease in blood pressure and headache. She is to remain seated for at least 10 minutes after taking the nitroglycerin. She is given instructions and a phone number to [facility] to have her medicines evaluated, lab work, H&P, etc. the patient's daughter has been made aware that excepting medications written here without having laboratory drawn first and a full H&P with medical history research she accepts  the risk associated with this plan of refills and less than optimal situation.  Hayden Rasmussen, NP 04/22/13 1154  Hayden Rasmussen, NP 04/22/13 1155  Hayden Rasmussen, NP 04/22/13 1156  Hayden Rasmussen, NP 04/22/13 [phone]

## 2013-04-22 NOTE — ED Notes (Signed)
Patient requesting medication refill.  [facility]12-patient is not established with a pcp, has a cardilologist

## 2013-04-22 NOTE — ED Notes (Signed)
Patient has medication samples : eutirex 25 mg cinarizina 75 mg nor-gerom 75 forte ginko biloba 80mg  asactone 25 mg plavix 75mg  Valsartan/hctz 320/25 Medications listed in med section of epic written for by dr Laural Benes at 4/24 /2014 visit Patient medications have been researched on line, some not able to locate specifically

## 2013-04-22 NOTE — ED Provider Notes (Signed)
Medical screening examination/treatment/procedure(s) were performed by non-physician practitioner and as supervising physician I was immediately available for consultation/collaboration.  Leslee Home, M.D.  Reuben Likes, MD 04/22/13 [phone]

## 2013-06-10 ENCOUNTER — Ambulatory Visit: Payer: PRIVATE HEALTH INSURANCE | Attending: Internal Medicine | Admitting: Internal Medicine

## 2013-06-10 ENCOUNTER — Encounter: Payer: Self-pay | Admitting: Internal Medicine

## 2013-06-10 VITALS — BP 115/66 | HR 90 | Temp 98.1°F | Resp 16 | Ht 61.0 in | Wt 152.0 lb

## 2013-06-10 DIAGNOSIS — I10 Essential (primary) hypertension: Secondary | ICD-10-CM

## 2013-06-10 MED ORDER — AMLODIPINE BESYLATE 5 MG PO TABS: 5.0000 mg | ORAL_TABLET | Freq: Every day | ORAL | Status: DC

## 2013-06-10 MED ORDER — VALSARTAN-HYDROCHLOROTHIAZIDE 320-25 MG PO TABS: 1.0000 | ORAL_TABLET | Freq: Every day | ORAL | Status: DC

## 2013-06-10 MED ORDER — FLUTICASONE PROPIONATE 50 MCG/ACT NA SUSP: 2.0000 | Freq: Every day | NASAL | Status: DC

## 2013-06-10 MED ORDER — NITROGLYCERIN 0.4 MG SL SUBL: 0.4000 mg | SUBLINGUAL_TABLET | SUBLINGUAL | Status: DC | PRN

## 2013-06-10 MED ORDER — FLUOXETINE HCL 20 MG PO CAPS: 20.0000 mg | ORAL_CAPSULE | Freq: Every day | ORAL | Status: DC

## 2013-06-10 MED ORDER — SPIRONOLACTONE 25 MG PO TABS: 25.0000 mg | ORAL_TABLET | Freq: Every day | ORAL | Status: DC

## 2013-06-10 MED ORDER — CARVEDILOL 12.5 MG PO TABS: 12.5000 mg | ORAL_TABLET | Freq: Two times a day (BID) | ORAL | Status: DC

## 2013-06-10 MED ORDER — ATORVASTATIN CALCIUM 10 MG PO TABS: 10.0000 mg | ORAL_TABLET | Freq: Every day | ORAL | Status: DC

## 2013-06-10 MED ORDER — LEVOTHYROXINE SODIUM 25 MCG PO TABS: 25.0000 ug | ORAL_TABLET | Freq: Every day | ORAL | Status: DC

## 2013-06-10 MED ORDER — CLOPIDOGREL BISULFATE 75 MG PO TABS: 75.0000 mg | ORAL_TABLET | Freq: Every day | ORAL | Status: DC

## 2013-06-10 NOTE — Progress Notes (Unsigned)
Patient ID: Wendy Petty, female   DOB: 09/14/1940, 72 y.o.   MRN: [id number]  Patient Demographics  Wendy Petty, is a 72 y.o. female  CSN: [id number]  MRN: [id number]  DOB - 1941/07/06  Outpatient Primary MD for the patient is Jeanann Lewandowsky, MD   With History of -  Past Medical History  Diagnosis Date  . Hypertension   . GERD (gastroesophageal reflux disease)   . HYPERLIPIDEMIA   . THYROID NODULE   . PULMONARY NODULE   . Edema   . CHEST PAIN UNSPECIFIED   . ANEMIA, CHRONIC       Past Surgical History  Procedure Laterality Date  . Vaginal hysterectomy  1989  . Tubal ligation      in for   Chief Complaint  Patient presents with  . Follow-up  . Hypertension  . Medication Refill     HPI  Wendy Petty  is a 72 y.o. female,  with history of dyslipidemia, hypertension, TIA, possible CAD, hypothyroidism, depression, who moved from [place], comes here to establish care she is symptom free and has no subjective complaints whatsoever. She requires refills of her home medications.    Review of Systems    In addition to the HPI above  No Fever-chills, No Headache, No changes with Vision or hearing, No problems swallowing food or Liquids, No Chest pain, Cough or Shortness of Breath, No Abdominal pain, No Nausea or Vommitting, Bowel movements are regular, No Blood in stool or Urine, No dysuria, No new skin rashes or bruises, No new joints pains-aches,  No new weakness, tingling, numbness in any extremity, No recent weight gain or loss, No polyuria, polydypsia or polyphagia, No significant Mental Stressors.  A full 10 point Review of Systems was done, except as stated above, all other Review of Systems were negative.   Social History History  Substance Use Topics  . Smoking status: Never Smoker   . Smokeless tobacco: Never Used  . Alcohol Use: No      Family History No CAD at young age  Prior to Admission medications   Medication Sig  Start Date End Date Taking? Authorizing Provider  amLODipine (NORVASC) 5 MG tablet Take 1 tablet (5 mg total) by mouth daily. 06/10/13  Yes Leroy Sea, MD  atorvastatin (LIPITOR) 10 MG tablet Take 1 tablet (10 mg total) by mouth daily. 06/10/13  Yes Leroy Sea, MD  carvedilol (COREG) 12.5 MG tablet Take 1 tablet (12.5 mg total) by mouth 2 (two) times daily with a meal. 06/10/13  Yes Leroy Sea, MD  clopidogrel (PLAVIX) 75 MG tablet Take 1 tablet (75 mg total) by mouth daily. 06/10/13  Yes Leroy Sea, MD  FLUoxetine (PROZAC) 20 MG capsule Take 1 capsule (20 mg total) by mouth daily. 06/10/13  Yes Leroy Sea, MD  levothyroxine (SYNTHROID, LEVOTHROID) 25 MCG tablet Take 1 tablet (25 mcg total) by mouth daily before breakfast. 06/10/13  Yes Leroy Sea, MD  spironolactone (ALDACTONE) 25 MG tablet Take 1 tablet (25 mg total) by mouth daily. 06/10/13  Yes Leroy Sea, MD  valsartan-hydrochlorothiazide (DIOVAN-HCT) 320-25 MG per tablet Take 1 tablet by mouth daily. 06/10/13  Yes Leroy Sea, MD  fluticasone (FLONASE) 50 MCG/ACT nasal spray Place 2 sprays into the nose daily. 06/10/13   Leroy Sea, MD  nitroGLYCERIN (NITROSTAT) 0.4 MG SL tablet Place 1 tablet (0.4 mg total) under the tongue every 5 (five) minutes as needed for chest pain. 06/10/13  Leroy Sea, MD    No Known Allergies  Physical Exam  Vitals  Blood pressure 115/66, pulse 90, temperature 98.1 F (36.7 C), temperature source Oral, resp. rate 16, height 5\' 1"  (1.549 m), weight 152 lb (68.947 kg), SpO2 98.00%.   1. General elderly Hispanic female sitting on clinic examination table in no apparent distress,    2. Normal affect and insight, Not Suicidal or Homicidal, Awake Alert, Oriented X 3.  3. No F.N deficits, ALL C.Nerves Intact, Strength 5/5 all 4 extremities, Sensation intact all 4 extremities, Plantars down going.  4. Ears and Eyes appear Normal, Conjunctivae clear, PERRLA. Moist  Oral Mucosa.  5. Supple Neck, No JVD, No cervical lymphadenopathy appriciated, No Carotid Bruits.  6. Symmetrical Chest wall movement, Good air movement bilaterally, CTAB.  7. RRR, No Gallops, Rubs or Murmurs, No Parasternal Heave.  8. Positive Bowel Sounds, Abdomen Soft, Non tender, No organomegaly appriciated,No rebound -guarding or rigidity.  9.  No Cyanosis, Normal Skin Turgor, No Skin Rash or Bruise.  10. Good muscle tone,  joints appear normal , no effusions, Normal ROM.  11. No Palpable Lymph Nodes in Neck or Axillae     Data Review  CBC No results found for this basename: WBC, HGB, HCT, PLT, MCV, MCH, MCHC, RDW, NEUTRABS, LYMPHSABS, MONOABS, EOSABS, BASOSABS, BANDABS, BANDSABD,  in the last 168 hours ------------------------------------------------------------------------------------------------------------------  Chemistries  No results found for this basename: NA, K, CL, CO2, GLUCOSE, BUN, CREATININE, GFRCGP, CALCIUM, MG, AST, ALT, ALKPHOS, BILITOT,  in the last 168 hours ------------------------------------------------------------------------------------------------------------------ estimated creatinine clearance is 44 ml/min (by C-G formula based on Cr of 1.04). ------------------------------------------------------------------------------------------------------------------ No results found for this basename: TSH, T4TOTAL, FREET3, T3FREE, THYROIDAB,  in the last 72 hours   Coagulation profile No results found for this basename: INR, PROTIME,  in the last 168 hours ------------------------------------------------------------------------------------------------------------------- No results found for this basename: DDIMER,  in the last 72 hours -------------------------------------------------------------------------------------------------------------------  Cardiac Enzymes No results found for this basename: CK, CKMB, TROPONINI, MYOGLOBIN,  in the last 168  hours ------------------------------------------------------------------------------------------------------------------ No components found with this basename: POCBNP,    ---------------------------------------------------------------------------------------------------------------  Urinalysis No results found for this basename: colorurine, appearanceur, labspec, phurine, glucoseu, hgbur, bilirubinur, ketonesur, proteinur, urobilinogen, nitrite, leukocytesur       Assessment and plan  Hypertension. Stable continue home medications which include Coreg, ACE inhibitor and diuretics along with Norvasc.   CAD/TIA - this happened in [place], and details are unclear, she's currently symptom-free, continue on Coreg, statin, Plavix regimen. Continue risk factor modulation.   Hypo-Thyroidism on Synthroid which will be continued.   Patient will come back in 2 weeks she needs a full annual history and physical along with screening labs which should include CBC, BMP, TSH, A1c, lipid panel   Routine health maintenance.  Ordered flu shot, tetanus shot along with Pneumovax, GI referral for colonoscopy, OB referral for Pap smear and mammogram made        Leroy Sea M.D on 06/10/2013 at 6:03 PM

## 2013-06-10 NOTE — Progress Notes (Unsigned)
Pt here for medication refill Speaks Spanish-interpretor present TSH LEVEL

## 2013-06-15 ENCOUNTER — Encounter: Payer: Self-pay | Admitting: Obstetrics & Gynecology

## 2013-06-24 ENCOUNTER — Encounter: Payer: Self-pay | Admitting: Physician Assistant

## 2013-06-24 ENCOUNTER — Ambulatory Visit (INDEPENDENT_AMBULATORY_CARE_PROVIDER_SITE_OTHER): Payer: No Typology Code available for payment source | Admitting: Physician Assistant

## 2013-06-24 ENCOUNTER — Telehealth: Payer: Self-pay | Admitting: *Deleted

## 2013-06-24 VITALS — BP 140/74 | HR 83 | Ht 61.0 in | Wt 153.8 lb

## 2013-06-24 DIAGNOSIS — R079 Chest pain, unspecified: Secondary | ICD-10-CM

## 2013-06-24 DIAGNOSIS — I2510 Atherosclerotic heart disease of native coronary artery without angina pectoris: Secondary | ICD-10-CM

## 2013-06-24 DIAGNOSIS — I10 Essential (primary) hypertension: Secondary | ICD-10-CM

## 2013-06-24 DIAGNOSIS — K219 Gastro-esophageal reflux disease without esophagitis: Secondary | ICD-10-CM

## 2013-06-24 DIAGNOSIS — E785 Hyperlipidemia, unspecified: Secondary | ICD-10-CM

## 2013-06-24 DIAGNOSIS — R0602 Shortness of breath: Secondary | ICD-10-CM

## 2013-06-24 DIAGNOSIS — Z8673 Personal history of transient ischemic attack (TIA), and cerebral infarction without residual deficits: Secondary | ICD-10-CM

## 2013-06-24 HISTORY — DX: Atherosclerotic heart disease of native coronary artery without angina pectoris: I25.10

## 2013-06-24 HISTORY — DX: Personal history of transient ischemic attack (TIA), and cerebral infarction without residual deficits: Z86.73

## 2013-06-24 LAB — CBC WITH DIFFERENTIAL/PLATELET
Basophils Relative: 0.4 % (ref 0.0–3.0)
Hemoglobin: 12 g/dL (ref 12.0–15.0)
Lymphocytes Relative: 28.9 % (ref 12.0–46.0)
MCHC: 33.7 g/dL (ref 30.0–36.0)
Monocytes Relative: 7.1 % (ref 3.0–12.0)
Neutro Abs: 3.3 10*3/uL (ref 1.4–7.7)
RBC: 4 Mil/uL (ref 3.87–5.11)

## 2013-06-24 LAB — BASIC METABOLIC PANEL
CO2: 22 mEq/L (ref 19–32)
Calcium: 9.4 mg/dL (ref 8.4–10.5)
GFR: 43.27 mL/min — ABNORMAL LOW (ref >60.00)
Sodium: 139 mEq/L (ref 135–145)

## 2013-06-24 MED ORDER — OMEPRAZOLE 20 MG PO CPDR: 20.0000 mg | DELAYED_RELEASE_CAPSULE | Freq: Every day | ORAL | Status: DC

## 2013-06-24 MED ORDER — ISOSORBIDE MONONITRATE ER 30 MG PO TB24: 30.0000 mg | ORAL_TABLET | Freq: Every day | ORAL | Status: DC

## 2013-06-24 NOTE — Telephone Encounter (Signed)
lmptcb on son's phone # due to language barrier; son speakes [place]. I asked for him tcb tomorrow to go over lab results and med changes.

## 2013-06-24 NOTE — Patient Instructions (Signed)
START IMDUR 30 MG DAILY START PRILOSEC 20 MG DAILY  LABS TODAY; BMET, CBC W/DIFF, BNP  PLEASE SCHEDULE TO HAVE AN EXERCISE MYOVIEW  PLEASE FOLLOW UP WITH DR. Eden Emms IN ABOUT 1 MONTH

## 2013-06-24 NOTE — Progress Notes (Signed)
[address] [place], [place]  [address] Phone: [phone] Fax:  [phone]  Date:  06/24/2013   ID:  Neville, Pauls 03-Aug-1941, MRN [id number]  PCP:  Jeanann Lewandowsky, MD  Cardiologist:  Dr. Charlton Haws     History of Present Illness: Wendy Petty is a 72 y.o. [place] female who returns for evaluation of CP.  She is a history of CAD treated medically, HTN, HL, hypothyroidism, prior TIA, depression.  Echo (09/16/07): EF 55%, mild LVH.  Myoview (8/10): Normal study, EF 66%.  ETT-echo (1/13):  EF 55%, inferior HK with stress (ischemia could not be ruled out).  LHC (09/12/11):  Mid LAD 40, ostial D2 90 (small caliber), distal D3 20-30, distal RCA 40-50, mid PDA 30, EF 55-60%.  Medical therapy recommended.  Last seen in this office 01/2013.  She is here with the interpreter.  She notes exertional CP for the last 6 mos.  She was in [place] in 09/2012 and describes an episode of transient L arm weakness (? TIA).  She states her BP was high at the time.  Her symptoms resolved.  She notes CP with mod exertion.  She describes CCS Class III symptoms.  No radiation to her jaw or arm or assoc diaphoresis.  She does note assoc dyspnea and nausea.  She feels tired.  She takes NTG with relief. She also notes pain at rest and with lying flat.  She has some relation to meals.  She notes belching and dysphagia.  She denies syncope.   She sleeps on several pillows.  This is chronic.  No PND.  She has some mild edema (L>R).  Overall, she states her that her CP is improved over the last 6 mos.      Labs (4/14):   K 4.9, creatinine 1.04, ALT 14, TC 211, triglycerides 474, HDL 40, LDL n/c, Hgb 13.3   Wt Readings from Last 3 Encounters:  06/10/13 152 lb (68.947 kg)  01/11/13 147 lb 12.8 oz (67.042 kg)  10/03/11 155 lb (70.308 kg)     Past Medical History  Diagnosis Date  . Hypertension   . GERD (gastroesophageal reflux disease)   . HYPERLIPIDEMIA   . THYROID NODULE   . PULMONARY  NODULE   . Edema   . CHEST PAIN UNSPECIFIED   . ANEMIA, CHRONIC     Current Outpatient Prescriptions  Medication Sig Dispense Refill  . amLODipine (NORVASC) 5 MG tablet Take 1 tablet (5 mg total) by mouth daily.  30 tablet  3  . atorvastatin (LIPITOR) 10 MG tablet Take 1 tablet (10 mg total) by mouth daily.  30 tablet  3  . carvedilol (COREG) 12.5 MG tablet Take 1 tablet (12.5 mg total) by mouth 2 (two) times daily with a meal.  60 tablet  3  . clopidogrel (PLAVIX) 75 MG tablet Take 1 tablet (75 mg total) by mouth daily.  30 tablet  3  . FLUoxetine (PROZAC) 20 MG capsule Take 1 capsule (20 mg total) by mouth daily.  30 capsule  3  . fluticasone (FLONASE) 50 MCG/ACT nasal spray Place 2 sprays into the nose daily.  16 g  6  . levothyroxine (SYNTHROID, LEVOTHROID) 25 MCG tablet Take 1 tablet (25 mcg total) by mouth daily before breakfast.  30 tablet  3  . nitroGLYCERIN (NITROSTAT) 0.4 MG SL tablet Place 1 tablet (0.4 mg total) under the tongue every 5 (five) minutes as needed for chest pain.  15 tablet  2  . spironolactone (ALDACTONE) 25 MG tablet Take 1 tablet (25 mg total) by mouth daily.  30 tablet  0  . valsartan-hydrochlorothiazide (DIOVAN-HCT) 320-25 MG per tablet Take by mouth daily. 1/2 tablet daily      . [DISCONTINUED] cetirizine (ZYRTEC) 10 MG tablet Take 10 mg by mouth daily.        . [DISCONTINUED] losartan-hydrochlorothiazide (HYZAAR) 100-25 MG per tablet Take 1 tablet by mouth daily.        . [DISCONTINUED] pantoprazole (PROTONIX) 40 MG tablet Take 40 mg by mouth daily.         No current facility-administered medications for this visit.    Allergies:   No Known Allergies  Social History:  The patient  reports that she has never smoked. She has never used smokeless tobacco. She reports that she does not drink alcohol or use illicit drugs.   Family History:  The patient's family history is not on file.   ROS:  Please see the history of present illness.   She has a  non-productive cough.   All other systems reviewed and negative.   PHYSICAL EXAM: VS:  BP 140/74  Pulse 83  Ht 5\' 1"  (1.549 m)  Wt 153 lb 12.8 oz (69.763 kg)  BMI 29.08 kg/m2 Well nourished, well developed, in no acute distress HEENT: normal Neck: no JVD Vascular: no carotid bruits Cardiac:  normal S1, S2; RRR; no murmur Lungs:  clear to auscultation bilaterally, no wheezing, rhonchi or rales Abd: soft, nontender, no hepatomegaly Ext: trace bilateral LE edema (L>R) Skin: warm and dry Neuro:  CNs 2-12 intact, no focal abnormalities noted  EKG:  NSR, HR 83, normal axis, no acute changes     ASSESSMENT AND PLAN:  1. Chest Pain:  She has exertional CP that sounds c/w with CCS Class III angina.  However, her symptoms are also somewhat atypical.  There is a question of possible symptoms related to GERD.  She is somewhat of a difficult historian.  She has a hx of high grade disease in a small diagonal which could possibly contribute to symptoms.  I will add Imdur 30 QD.  I will also start Prilosec 20 QD.  I will arrange an ETT-Myoview to assess for significant ischemia and progression of disease.  I will check a BMET, BNP, CBC.   2. CAD:  Proceed with Myoview as noted.  Add Isosorbide.  Continue ASA, Plavix, beta blocker, amlodipine, statin. 3. Hypertension:  Controlled.  4. Hyperlipidemia:  Continue statin. 5. Prior TIA:  Continue ASA and Plavix.  6. Disposition:  F/u with Dr. Charlton Haws in 1 month.   Signed, Tereso Newcomer, PA-C  06/24/2013 8:37 AM

## 2013-06-28 ENCOUNTER — Telehealth: Payer: Self-pay | Admitting: Emergency Medicine

## 2013-06-28 ENCOUNTER — Encounter (HOSPITAL_COMMUNITY): Payer: Self-pay | Admitting: Emergency Medicine

## 2013-06-28 ENCOUNTER — Inpatient Hospital Stay (HOSPITAL_COMMUNITY)
Admission: EM | Admit: 2013-06-28 | Discharge: 2013-06-30 | DRG: 069 | Disposition: A | Discharge status: Home / Self Care | Payer: No Typology Code available for payment source | Attending: Family Medicine | Admitting: Family Medicine

## 2013-06-28 ENCOUNTER — Emergency Department (HOSPITAL_COMMUNITY): Payer: No Typology Code available for payment source

## 2013-06-28 DIAGNOSIS — Z8673 Personal history of transient ischemic attack (TIA), and cerebral infarction without residual deficits: Secondary | ICD-10-CM

## 2013-06-28 DIAGNOSIS — I2510 Atherosclerotic heart disease of native coronary artery without angina pectoris: Secondary | ICD-10-CM | POA: Diagnosis present

## 2013-06-28 DIAGNOSIS — F411 Generalized anxiety disorder: Secondary | ICD-10-CM | POA: Diagnosis present

## 2013-06-28 DIAGNOSIS — E785 Hyperlipidemia, unspecified: Secondary | ICD-10-CM | POA: Diagnosis present

## 2013-06-28 DIAGNOSIS — F3289 Other specified depressive episodes: Secondary | ICD-10-CM | POA: Diagnosis present

## 2013-06-28 DIAGNOSIS — N179 Acute kidney failure, unspecified: Secondary | ICD-10-CM | POA: Diagnosis present

## 2013-06-28 DIAGNOSIS — R079 Chest pain, unspecified: Secondary | ICD-10-CM

## 2013-06-28 DIAGNOSIS — K219 Gastro-esophageal reflux disease without esophagitis: Secondary | ICD-10-CM | POA: Diagnosis present

## 2013-06-28 DIAGNOSIS — G8190 Hemiplegia, unspecified affecting unspecified side: Secondary | ICD-10-CM | POA: Diagnosis present

## 2013-06-28 DIAGNOSIS — E860 Dehydration: Secondary | ICD-10-CM | POA: Diagnosis present

## 2013-06-28 DIAGNOSIS — A498 Other bacterial infections of unspecified site: Secondary | ICD-10-CM | POA: Diagnosis present

## 2013-06-28 DIAGNOSIS — I10 Essential (primary) hypertension: Secondary | ICD-10-CM | POA: Diagnosis present

## 2013-06-28 DIAGNOSIS — Z79899 Other long term (current) drug therapy: Secondary | ICD-10-CM

## 2013-06-28 DIAGNOSIS — F329 Major depressive disorder, single episode, unspecified: Secondary | ICD-10-CM | POA: Diagnosis present

## 2013-06-28 DIAGNOSIS — D649 Anemia, unspecified: Secondary | ICD-10-CM | POA: Diagnosis present

## 2013-06-28 DIAGNOSIS — Z7902 Long term (current) use of antithrombotics/antiplatelets: Secondary | ICD-10-CM

## 2013-06-28 DIAGNOSIS — G459 Transient cerebral ischemic attack, unspecified: Principal | ICD-10-CM | POA: Diagnosis present

## 2013-06-28 DIAGNOSIS — Z23 Encounter for immunization: Secondary | ICD-10-CM

## 2013-06-28 DIAGNOSIS — E039 Hypothyroidism, unspecified: Secondary | ICD-10-CM | POA: Diagnosis present

## 2013-06-28 LAB — RAPID URINE DRUG SCREEN, HOSP PERFORMED
Barbiturates: NOT DETECTED
Benzodiazepines: NOT DETECTED
Cocaine: NOT DETECTED
Opiates: NOT DETECTED

## 2013-06-28 LAB — URINE MICROSCOPIC-ADD ON

## 2013-06-28 LAB — URINALYSIS, ROUTINE W REFLEX MICROSCOPIC
Bilirubin Urine: NEGATIVE
Glucose, UA: NEGATIVE mg/dL
Hgb urine dipstick: NEGATIVE
Ketones, ur: NEGATIVE mg/dL
Specific Gravity, Urine: 1.013 (ref 1.005–1.030)
pH: 5.5 (ref 5.0–8.0)

## 2013-06-28 LAB — POCT I-STAT, CHEM 8
BUN: 49 mg/dL — ABNORMAL HIGH (ref 6–23)
Chloride: 111 mEq/L (ref 96–112)
Creatinine, Ser: 1.7 mg/dL — ABNORMAL HIGH (ref 0.50–1.10)
Potassium: 5.3 mEq/L — ABNORMAL HIGH (ref 3.5–5.1)
Sodium: 141 mEq/L (ref 135–145)

## 2013-06-28 LAB — APTT: aPTT: 30 seconds (ref 24–37)

## 2013-06-28 LAB — GLUCOSE, CAPILLARY: Glucose-Capillary: 88 mg/dL (ref 70–99)

## 2013-06-28 LAB — DIFFERENTIAL
Basophils Relative: 0 % (ref 0–1)
Eosinophils Absolute: 0.2 10*3/uL (ref 0.0–0.7)
Eosinophils Relative: 5 % (ref 0–5)
Lymphs Abs: 2 10*3/uL (ref 0.7–4.0)
Neutrophils Relative %: 49 % (ref 43–77)

## 2013-06-28 LAB — CBC
MCH: 29.8 pg (ref 26.0–34.0)
MCV: 89.6 fL (ref 78.0–100.0)
Platelets: 275 10*3/uL (ref 150–400)
RDW: 12.9 % (ref 11.5–15.5)

## 2013-06-28 LAB — COMPREHENSIVE METABOLIC PANEL
ALT: 14 U/L (ref 0–35)
Albumin: 3.9 g/dL (ref 3.5–5.2)
Alkaline Phosphatase: 75 U/L (ref 39–117)
Calcium: 9.1 mg/dL (ref 8.4–10.5)
Creatinine, Ser: 1.44 mg/dL — ABNORMAL HIGH (ref 0.50–1.10)
GFR calc Af Amer: 41 mL/min — ABNORMAL LOW (ref >90)
Glucose, Bld: 101 mg/dL — ABNORMAL HIGH (ref 70–99)
Potassium: 5 mEq/L (ref 3.5–5.1)
Sodium: 137 mEq/L (ref 135–145)
Total Protein: 8.1 g/dL (ref 6.0–8.3)

## 2013-06-28 LAB — PROTIME-INR
INR: 1.06 (ref 0.00–1.49)
Prothrombin Time: 13.6 seconds (ref 11.6–15.2)

## 2013-06-28 LAB — POCT I-STAT TROPONIN I: Troponin i, poc: 0.01 ng/mL (ref 0.00–0.08)

## 2013-06-28 LAB — ETHANOL: Alcohol, Ethyl (B): <11 mg/dL (ref 0–11)

## 2013-06-28 LAB — TROPONIN I: Troponin I: <0.3 ng/mL (ref <0.30)

## 2013-06-28 MED ORDER — CARVEDILOL 12.5 MG PO TABS
12.5000 mg | ORAL_TABLET | Freq: Two times a day (BID) | ORAL | Status: DC
  Administered 2013-06-29 – 2013-06-30 (×3): 12.5 mg via ORAL
  Filled 2013-06-28 (×5): qty 1

## 2013-06-28 MED ORDER — AMLODIPINE BESYLATE 5 MG PO TABS
5.0000 mg | ORAL_TABLET | Freq: Every day | ORAL | Status: DC
  Administered 2013-06-29 – 2013-06-30 (×2): 5 mg via ORAL
  Filled 2013-06-28 (×2): qty 1

## 2013-06-28 MED ORDER — LEVOTHYROXINE SODIUM 25 MCG PO TABS
25.0000 ug | ORAL_TABLET | Freq: Every day | ORAL | Status: DC
  Administered 2013-06-29 – 2013-06-30 (×2): 25 ug via ORAL
  Filled 2013-06-28 (×3): qty 1

## 2013-06-28 MED ORDER — CLOPIDOGREL BISULFATE 75 MG PO TABS
75.0000 mg | ORAL_TABLET | Freq: Every day | ORAL | Status: DC
  Administered 2013-06-29 – 2013-06-30 (×2): 75 mg via ORAL
  Filled 2013-06-28 (×3): qty 1

## 2013-06-28 MED ORDER — SODIUM CHLORIDE 0.9 % IV SOLN: INTRAVENOUS | Status: DC

## 2013-06-28 MED ORDER — FLUOXETINE HCL 20 MG PO CAPS
20.0000 mg | ORAL_CAPSULE | Freq: Every day | ORAL | Status: DC
  Administered 2013-06-29 – 2013-06-30 (×2): 20 mg via ORAL
  Filled 2013-06-28 (×2): qty 1

## 2013-06-28 MED ORDER — INFLUENZA VAC SPLIT QUAD 0.5 ML IM SUSP
0.5000 mL | INTRAMUSCULAR | Status: AC
  Filled 2013-06-28: qty 0.5

## 2013-06-28 MED ORDER — ATORVASTATIN CALCIUM 10 MG PO TABS
10.0000 mg | ORAL_TABLET | Freq: Every day | ORAL | Status: DC
  Filled 2013-06-28: qty 1

## 2013-06-28 MED ORDER — NITROGLYCERIN 0.4 MG SL SUBL: 0.4000 mg | SUBLINGUAL_TABLET | SUBLINGUAL | Status: DC | PRN

## 2013-06-28 MED ORDER — VALSARTAN-HYDROCHLOROTHIAZIDE 320-25 MG PO TABS: 1.0000 | ORAL_TABLET | Freq: Every day | ORAL | Status: DC

## 2013-06-28 MED ORDER — SPIRONOLACTONE 25 MG PO TABS: 25.0000 mg | ORAL_TABLET | Freq: Every day | ORAL | Status: DC

## 2013-06-28 MED ORDER — SODIUM CHLORIDE 0.9 % IV SOLN
INTRAVENOUS | Status: DC
  Administered 2013-06-29: via INTRAVENOUS

## 2013-06-28 MED ORDER — PANTOPRAZOLE SODIUM 40 MG PO TBEC
40.0000 mg | DELAYED_RELEASE_TABLET | Freq: Every day | ORAL | Status: DC
  Administered 2013-06-29 – 2013-06-30 (×2): 40 mg via ORAL
  Filled 2013-06-28 (×2): qty 1

## 2013-06-28 MED ORDER — HEPARIN SODIUM (PORCINE) 5000 UNIT/ML IJ SOLN: 5000.0000 [IU] | Freq: Three times a day (TID) | INTRAMUSCULAR | Status: DC

## 2013-06-28 MED ORDER — HEPARIN SODIUM (PORCINE) 5000 UNIT/ML IJ SOLN
5000.0000 [IU] | Freq: Three times a day (TID) | INTRAMUSCULAR | Status: DC
  Administered 2013-06-29 – 2013-06-30 (×4): 5000 [IU] via SUBCUTANEOUS
  Filled 2013-06-28 (×7): qty 1

## 2013-06-28 NOTE — ED Notes (Signed)
Admitting MDs at bedside.

## 2013-06-28 NOTE — Consult Note (Signed)
Neurology Consultation Reason for Consult: Transient numbness Referring Physician: Rosalia Hammers, D  CC: Transient numbness  History is obtained from: Patient  HPI: Wendy Petty is a 72 y.o. female who had an episode that sounds like stroke in January of this year while [place]. She had an episode last night where her right leg went numb then gradually resolved. Later, her left arm and face went numb and then gradually resolved. She states that this is similar to a previous episode that occurred in January. At that time, she had numbenss and weakness of her left arm that slowly resolved over the course of 2 months.   Today, the symptoms resolved with the exception of persistent left facial numbness which is still present at the time of my exam and which the patient states is new.   All history gained with assistance of interpreter.    LKW: Last night tpa given: no, out of window.     ROS: A 14 point ROS was performed and is negative except as noted in the HPI.  Past Medical History  Diagnosis Date  . Hypertension   . GERD (gastroesophageal reflux disease)   . HYPERLIPIDEMIA   . THYROID NODULE   . PULMONARY NODULE   . Edema   . CHEST PAIN UNSPECIFIED   . ANEMIA, CHRONIC     Family History: htn  Social History: Tob: Never smoker  Exam: Current vital signs: BP 135/66  Pulse 77  Temp(Src) 98 F (36.7 C) (Oral)  Resp 16  Wt 70.534 kg (155 lb 8 oz)  BMI 29.4 kg/m2  SpO2 97% Vital signs in last 24 hours: Temp:  [98 F (36.7 C)] 98 F (36.7 C) (10/20 1407) Pulse Rate:  [77] 77 (10/20 1407) Resp:  [16] 16 (10/20 1407) BP: (135)/(66) 135/66 mmHg (10/20 1407) SpO2:  [97 %] 97 % (10/20 1407) Weight:  [70.534 kg (155 lb 8 oz)] 70.534 kg (155 lb 8 oz) (10/20 1407)  General: in bed, NAD CV: RRR Mental Status: Patient is awake, alert, oriented to person, place, month, year, and situation. Immediate and remote memory are intact. Patient is able to give a  clear and coherent history. No signs of aphasia or neglect Cranial Nerves: II: Visual Fields are full. Pupils are equal, round, and reactive to light.  Discs are without papilledema. III,IV, VI: EOMI without ptosis or diploplia.  V: Facial sensation is symmetric to temperature VII: Facial movement is symmetric.  VIII: hearing is intact to voice X: Uvula elevates symmetrically XI: Shoulder shrug is symmetric. XII: tongue is midline without atrophy or fasciculations.  Motor: Tone is normal. Bulk is normal. 5/5 strength was present in all four extremities without drift.  Sensory: Sensation is symmetric to light touch and temperature in the arms and legs. Deep Tendon Reflexes: 2+ and symmetric in the biceps and patellae.  Plantars: Toes are downgoing bilaterally.  Cerebellar: FNF  are intact bilaterally Gait: Not assessed due to multiple medical monitors in ED setting.   I have reviewed labs in epic and the results pertinent to this consultation are: Elevated troponin  I have reviewed the images obtained:CT head - negative  Impression: 72 yo F with a history of likely previous stroke and now transient right leg numbness and left arm and face numbness that is incompletely resolved. Given her multiple risk factors for cerebrovascular disease, I think that I would treat this as TIA, though small stroke is also possible.   Recommendations: 1. HgbA1c, fasting  lipid panel 2. MRI, MRA  of the brain without contrast 3. Frequent neuro checks 4. Echocardiogram 5. Carotid dopplers 6. Prophylactic therapy-Antiplatelet med: Plavix - 75mg  7. Risk factor modification 8. Telemetry monitoring 9. PT consult, OT consult, Speech consult    Ritta Slot, MD [facility] [phone]  If 7pm- 7am, please page neurology on call at [phone].

## 2013-06-28 NOTE — ED Notes (Addendum)
Pt reports numbness to her Left hand, Right leg, face and her tongue last night at 2300, pt reports her symptoms have subsided, denies any pain or numbness today. Neuro intact, no facial droop, or arm drift

## 2013-06-28 NOTE — ED Provider Notes (Signed)
CSN: [id number]     Arrival date & time 06/28/13  1351 History   First MD Initiated Contact with Patient 06/28/13 1706     Chief Complaint  Patient presents with  . Numbness   (Consider location/radiation/quality/duration/timing/severity/associated sxs/prior Treatment) HPI Comments: Patient presents to the emergency department with chief complaint of left arm, left leg, and mouth numbness and tingling. Patient states that the symptoms began last night at 11:00. She is last known normal at 11:00 last night. She states that she has had symptoms similar to this several years ago in [place], but that the symptoms last night started suddenly. She states that she massaged her legs and arms, and the symptoms eventually subsided. She states that when she awoke this morning, she had some additional similar symptoms of numbness in the extremities. She states that she like to be evaluated for this, but that she does not want to "stay." Patient has past medical history remarkable for hypertension and high cholesterol. She is followed by Dr. Eden Emms from cardiology for chest pain. Nothing makes her symptoms better or worse. She denies any other symptoms. Denies chest pain, shortness of breath, nausea, vomiting, diarrhea, or constipation. Denies any visual acuity changes, or difficulty speaking.  The history is provided by the patient. No language interpreter was used.    Past Medical History  Diagnosis Date  . Hypertension   . GERD (gastroesophageal reflux disease)   . HYPERLIPIDEMIA   . THYROID NODULE   . PULMONARY NODULE   . Edema   . CHEST PAIN UNSPECIFIED   . ANEMIA, CHRONIC    Past Surgical History  Procedure Laterality Date  . Vaginal hysterectomy  1989  . Tubal ligation     History reviewed. No pertinent family history. History  Substance Use Topics  . Smoking status: Never Smoker   . Smokeless tobacco: Never Used  . Alcohol Use: No   OB History   Grav Para Term Preterm Abortions TAB  SAB Ect Mult Living                 Review of Systems  All other systems reviewed and are negative.    Allergies  Review of patient's allergies indicates no known allergies.  Home Medications   Current Outpatient Rx  Name  Route  Sig  Dispense  Refill  . amLODipine (NORVASC) 5 MG tablet   Oral   Take 1 tablet (5 mg total) by mouth daily.   30 tablet   3   . atorvastatin (LIPITOR) 10 MG tablet   Oral   Take 1 tablet (10 mg total) by mouth daily.   30 tablet   3   . carvedilol (COREG) 12.5 MG tablet   Oral   Take 1 tablet (12.5 mg total) by mouth 2 (two) times daily with a meal.   60 tablet   3   . clopidogrel (PLAVIX) 75 MG tablet   Oral   Take 1 tablet (75 mg total) by mouth daily.   30 tablet   3   . FLUoxetine (PROZAC) 20 MG capsule   Oral   Take 1 capsule (20 mg total) by mouth daily.   30 capsule   3   . fluticasone (FLONASE) 50 MCG/ACT nasal spray   Nasal   Place 2 sprays into the nose daily.   16 g   6   . levothyroxine (SYNTHROID, LEVOTHROID) 25 MCG tablet   Oral   Take 1 tablet (25 mcg total) by mouth daily  before breakfast.   30 tablet   3   . nitroGLYCERIN (NITROSTAT) 0.4 MG SL tablet   Sublingual   Place 1 tablet (0.4 mg total) under the tongue every 5 (five) minutes as needed for chest pain.   15 tablet   2   . OVER THE COUNTER MEDICATION   Oral   Take 1 capsule by mouth daily. "Dolo Neuro" cap         . pantoprazole (PROTONIX) 40 MG tablet   Oral   Take 40 mg by mouth daily.         Marland Kitchen spironolactone (ALDACTONE) 25 MG tablet   Oral   Take 1 tablet (25 mg total) by mouth daily.   30 tablet   0   . valsartan-hydrochlorothiazide (DIOVAN-HCT) 320-25 MG per tablet   Oral   Take 1 tablet by mouth daily.           BP 135/66  Pulse 77  Temp(Src) 98 F (36.7 C) (Oral)  Resp 16  Wt 155 lb 8 oz (70.534 kg)  BMI 29.4 kg/m2  SpO2 97% Physical Exam  Nursing note and vitals reviewed. Constitutional: She is oriented to  person, place, and time. She appears well-developed and well-nourished.  HENT:  Head: Normocephalic and atraumatic.  Eyes: Conjunctivae and EOM are normal. Pupils are equal, round, and reactive to light.  Neck: Normal range of motion. Neck supple.  Cardiovascular: Normal rate and regular rhythm.  Exam reveals no gallop and no friction rub.   No murmur heard. Pulmonary/Chest: Effort normal and breath sounds normal. No respiratory distress. She has no wheezes. She has no rales. She exhibits no tenderness.  Abdominal: Soft. She exhibits no distension and no mass. There is no tenderness. There is no rebound and no guarding.  Musculoskeletal: Normal range of motion. She exhibits no edema and no tenderness.  Moves all extremities  Neurological: She is alert and oriented to person, place, and time.  CN 3-12 intact, sensation and strength intact, gait is normal  Skin: Skin is warm and dry.  Psychiatric: She has a normal mood and affect. Her behavior is normal. Judgment and thought content normal.    ED Course  Procedures (including critical care time) Results for orders placed during the hospital encounter of 06/28/13  ETHANOL      Result Value Range   Alcohol, Ethyl (B) <11  0 - 11 mg/dL  PROTIME-INR      Result Value Range   Prothrombin Time 13.6  11.6 - 15.2 seconds   INR 1.06  0.00 - 1.49  APTT      Result Value Range   aPTT 30  24 - 37 seconds  CBC      Result Value Range   WBC 5.3  4.0 - 10.5 K/uL   RBC 3.93  3.87 - 5.11 MIL/uL   Hemoglobin 11.7 (*) 12.0 - 15.0 g/dL   HCT 16.1 (*) 09.6 - 04.5 %   MCV 89.6  78.0 - 100.0 fL   MCH 29.8  26.0 - 34.0 pg   MCHC 33.2  30.0 - 36.0 g/dL   RDW 40.9  81.1 - 91.4 %   Platelets 275  150 - 400 K/uL  DIFFERENTIAL      Result Value Range   Neutrophils Relative % 49  43 - 77 %   Neutro Abs 2.6  1.7 - 7.7 K/uL   Lymphocytes Relative 38  12 - 46 %   Lymphs Abs 2.0  0.7 -  4.0 K/uL   Monocytes Relative 8  3 - 12 %   Monocytes Absolute 0.5   0.1 - 1.0 K/uL   Eosinophils Relative 5  0 - 5 %   Eosinophils Absolute 0.2  0.0 - 0.7 K/uL   Basophils Relative 0  0 - 1 %   Basophils Absolute 0.0  0.0 - 0.1 K/uL  COMPREHENSIVE METABOLIC PANEL      Result Value Range   Sodium 137  135 - 145 mEq/L   Potassium 5.0  3.5 - 5.1 mEq/L   Chloride 107  96 - 112 mEq/L   CO2 20  19 - 32 mEq/L   Glucose, Bld 101 (*) 70 - 99 mg/dL   BUN 48 (*) 6 - 23 mg/dL   Creatinine, Ser 2.95 (*) 0.50 - 1.10 mg/dL   Calcium 9.1  8.4 - 62.1 mg/dL   Total Protein 8.1  6.0 - 8.3 g/dL   Albumin 3.9  3.5 - 5.2 g/dL   AST 18  0 - 37 U/L   ALT 14  0 - 35 U/L   Alkaline Phosphatase 75  39 - 117 U/L   Total Bilirubin 0.3  0.3 - 1.2 mg/dL   GFR calc non Af Amer 36 (*) >90 mL/min   GFR calc Af Amer 41 (*) >90 mL/min  TROPONIN I      Result Value Range   Troponin I <0.30  <0.30 ng/mL  URINE RAPID DRUG SCREEN (HOSP PERFORMED)      Result Value Range   Opiates NONE DETECTED  NONE DETECTED   Cocaine NONE DETECTED  NONE DETECTED   Benzodiazepines NONE DETECTED  NONE DETECTED   Amphetamines NONE DETECTED  NONE DETECTED   Tetrahydrocannabinol NONE DETECTED  NONE DETECTED   Barbiturates NONE DETECTED  NONE DETECTED  URINALYSIS, ROUTINE W REFLEX MICROSCOPIC      Result Value Range   Color, Urine YELLOW  YELLOW   APPearance CLOUDY (*) CLEAR   Specific Gravity, Urine 1.013  1.005 - 1.030   pH 5.5  5.0 - 8.0   Glucose, UA NEGATIVE  NEGATIVE mg/dL   Hgb urine dipstick NEGATIVE  NEGATIVE   Bilirubin Urine NEGATIVE  NEGATIVE   Ketones, ur NEGATIVE  NEGATIVE mg/dL   Protein, ur NEGATIVE  NEGATIVE mg/dL   Urobilinogen, UA 0.2  0.0 - 1.0 mg/dL   Nitrite NEGATIVE  NEGATIVE   Leukocytes, UA MODERATE (*) NEGATIVE  GLUCOSE, CAPILLARY      Result Value Range   Glucose-Capillary 88  70 - 99 mg/dL  URINE MICROSCOPIC-ADD ON      Result Value Range   Squamous Epithelial / LPF FEW (*) RARE   WBC, UA 7-10  <3 WBC/hpf   RBC / HPF 0-2  <3 RBC/hpf   Bacteria, UA FEW (*) RARE   POCT I-STAT TROPONIN I      Result Value Range   Troponin i, poc 0.01  0.00 - 0.08 ng/mL   Comment 3           POCT I-STAT, CHEM 8      Result Value Range   Sodium 141  135 - 145 mEq/L   Potassium 5.3 (*) 3.5 - 5.1 mEq/L   Chloride 111  96 - 112 mEq/L   BUN 49 (*) 6 - 23 mg/dL   Creatinine, Ser 3.08 (*) 0.50 - 1.10 mg/dL   Glucose, Bld 657 (*) 70 - 99 mg/dL   Calcium, Ion 8.46 (*) 1.13 - 1.30 mmol/L  TCO2 20  0 - 100 mmol/L   Hemoglobin 12.6  12.0 - 15.0 g/dL   HCT 16.1  09.6 - 04.5 %   Ct Head Wo Contrast  06/28/2013   CLINICAL DATA:  Numbness left hand, right leg, face and tongue last night. Symptoms have resolved. Hypertension. Hyperlipidemia.  EXAM: CT HEAD WITHOUT CONTRAST  TECHNIQUE: Contiguous axial images were obtained from the base of the skull through the vertex without intravenous contrast.  COMPARISON:  None.  FINDINGS: No intracranial hemorrhage.  Small vessel disease type changes. Remote right lenticular nucleus infarct. No CT evidence of large acute infarct.  No intracranial mass lesion noted on this unenhanced exam.  No hydrocephalus.  Intracranial vascular calcifications.  Smaller fluid level left maxillary sinus.  IMPRESSION: No intracranial hemorrhage.  Small vessel disease type changes. Remote right lenticular nucleus infarct. No CT evidence of large acute infarct.  Smaller fluid level left maxillary sinus.   Electronically Signed   By: Bridgett Larsson M.D.   On: 06/28/2013 18:06      EKG Interpretation     Ventricular Rate:  74 PR Interval:  146 QRS Duration: 92 QT Interval:  392 QTC Calculation: 435 R Axis:   56 Text Interpretation:  Normal sinus rhythm Normal ECG            MDM   1. TIA (transient ischemic attack)    Patient with left upper and lower extremity numbness which is transient. Symptoms started last night, and resolved. Patient had another episode this morning which has resolved. Will check basic labs, and CT head.  Patient will be  admitted by a family medicine for TIA rule out.  I have consult to neurology, Dr. Amada Jupiter, who will offer a formal consult.    Roxy Horseman, PA-C 06/28/13 2012

## 2013-06-28 NOTE — H&P (Signed)
Family Medicine Teaching [facility] Admission History and Physical Service Pager: [phone]  Patient name: Wendy Petty Medical record number: [id number] Date of birth: 07/13/1941 Age: 72 y.o. Gender: female  Primary Care Provider: Jeanann Lewandowsky, MD Consultants: Neurology  Code Status: Full   Chief Complaint:  left arm, right leg, and mouth numbness and tingling  Assessment and Plan: Wendy Petty is a 72 y.o. female presenting with left arm, right leg, and mouth numbness and tingling. 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 Neurology was consulted in ED.  PMH is significant for CAD treated medically, HTN, HL, hypothyroidism, prior TIA, depression.   #TIA: History of prior TIA in January with no residual deficits. Persistent oral facial numbness but no deficits on exam, if only maybe minimal mouth asymetry. 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  - continue ASA and plavix 75 mg. Patient was on this therapy prior to the event. If infarct confirmed, will check with neurology as to choice of anti-platelet therapy.  - greatly appreciate Neuro recommendations - monitor on telemetry  - MRI/MRA w/out contrast  - PT/OT/SLP  - risk stratification: HgbA1c, fasting lipid panel  - Carotid dopplers   - Neuro checks Q4  - echo  #Stable angina/CAD: reports a history of chest pressure. Relieved by nitro. Not actively in pain or having pressure. Followed by Dr. Eden Emms. Just seen on 10/16 with f/up in 1 month.  Cath performed in 09/16/11 which showed: EF:55%, mild LAD, ostial D2 90 (small caliber), distal D3 20-30, distal RCA 40-50, mid PDA 30 which resulted in medical therapy management. EKG is NSR and unchanged from previous. iStat trop negative. Possible GERD component.   - Trend troponin's  - ECHO; last ECHO in 2013 55%  - continue home medications: patient not listed as on imdur, although this was recommended by cards. Will hold for now and monitor chest  discomfort as well as BP. Continue coreg, Prontinx, nitro 0.4   #Hypothyroidism: stable  - TSH, last TSH January 2013 - 1.163 - continue home medication, levothyroxine 25 mcg  #HTN: stable with medications  - hold home Valsartan-HCTZ due to AKI and spironolactone 25 mg due to recently elevated K.  - continue: Carvediolol 12.5, Amlodipine 5mg . Hold imdur since it is not clear whether she is taking it.      #HLD: stable with medications  - lipid panel  - continue lipitor 10 mg    #AKI: Cr 1/4 (baseline .8-.9) no history of previous injury. Possible due to dehydration  - BMET AM  - holding valsartan-HCTZ   #Depression: stable  - continue home medications: fluoxetine 20 mg daily   FEN/GI: NPO pending Speech eval, NS  100 mL/hr Prophylaxis: hep subQ  Disposition: admitted to tele for evaluation of TIA vs acute stroke, Dr. Gwendolyn Grant attending.   History of Present Illness: Wendy Petty is a 72 y.o. female presenting with left arm, right leg and mouth numbness and tingling around 11 pm last night. She started walking and drinking lots of water to see if would go away. She woke up this morning and the symptoms were still present. She was feeling very hot during this episode but denies diaphoresis.  She still feels numbness in the tongue. She states she had an episode that sounds like stroke in January of this year while in her country of origin [place]  Denies any visual acuity changes, or difficulty speaking. Denies any one sided weakness or difficuly walking.   She states that she  was having shortness of breath even at rest. She had chest pressure that lasted a half hour and not currently having any chest pressure.  She   Roughly three weeks ago she reports having chest pain for which she took sublingual nitro and the pain subsided after a few minutes. She has not had any symptoms since then. She is followed by Dr. Eden Emms from cardiology for chest pain. Nothing makes her symptoms better  or worse. She denies any other symptoms. Denies nausea, vomiting, diarrhea, or constipation.  ED course: Neurology consulted in ED.   Review Of Systems: Per HPI with the following additions: All other systems reviewed and otherwise normal.  Otherwise 12 point review of systems was performed and was unremarkable.  Patient Active Problem List   Diagnosis Date Noted  . Coronary atherosclerosis of native coronary artery 06/24/2013  . History of TIAs 06/24/2013  . GERD (gastroesophageal reflux disease) 06/24/2013  . Depression 12/31/2012  . Edema 02/17/2009  . CHEST PAIN UNSPECIFIED 02/17/2009  . THYROID NODULE 02/14/2009  . HYPERLIPIDEMIA 02/14/2009  . ANEMIA, CHRONIC 02/14/2009  . HYPERTENSION 02/14/2009  . PULMONARY NODULE 02/14/2009   Past Medical History: Past Medical History  Diagnosis Date  . Hypertension   . GERD (gastroesophageal reflux disease)   . HYPERLIPIDEMIA   . THYROID NODULE   . PULMONARY NODULE   . Edema   . CHEST PAIN UNSPECIFIED   . ANEMIA, CHRONIC    Past Surgical History: Past Surgical History  Procedure Laterality Date  . Vaginal hysterectomy  1989  . Tubal ligation     Social History: History  Substance Use Topics  . Smoking status: Never Smoker   . Smokeless tobacco: Never Used  . Alcohol Use: No   Additional social history: none  Please also refer to relevant sections of EMR.  Family History: History reviewed. No pertinent family history. Allergies and Medications: No Known Allergies No current facility-administered medications on file prior to encounter.   Current Outpatient Prescriptions on File Prior to Encounter  Medication Sig Dispense Refill  . amLODipine (NORVASC) 5 MG tablet Take 1 tablet (5 mg total) by mouth daily.  30 tablet  3  . atorvastatin (LIPITOR) 10 MG tablet Take 1 tablet (10 mg total) by mouth daily.  30 tablet  3  . carvedilol (COREG) 12.5 MG tablet Take 1 tablet (12.5 mg total) by mouth 2 (two) times daily with a  meal.  60 tablet  3  . clopidogrel (PLAVIX) 75 MG tablet Take 1 tablet (75 mg total) by mouth daily.  30 tablet  3  . FLUoxetine (PROZAC) 20 MG capsule Take 1 capsule (20 mg total) by mouth daily.  30 capsule  3  . fluticasone (FLONASE) 50 MCG/ACT nasal spray Place 2 sprays into the nose daily.  16 g  6  . levothyroxine (SYNTHROID, LEVOTHROID) 25 MCG tablet Take 1 tablet (25 mcg total) by mouth daily before breakfast.  30 tablet  3  . nitroGLYCERIN (NITROSTAT) 0.4 MG SL tablet Place 1 tablet (0.4 mg total) under the tongue every 5 (five) minutes as needed for chest pain.  15 tablet  2  . spironolactone (ALDACTONE) 25 MG tablet Take 1 tablet (25 mg total) by mouth daily.  30 tablet  0  . valsartan-hydrochlorothiazide (DIOVAN-HCT) 320-25 MG per tablet Take 1 tablet by mouth daily.       . [DISCONTINUED] cetirizine (ZYRTEC) 10 MG tablet Take 10 mg by mouth daily.        . [  DISCONTINUED] losartan-hydrochlorothiazide (HYZAAR) 100-25 MG per tablet Take 1 tablet by mouth daily.          Objective: BP 121/56  Pulse 77  Temp(Src) 98 F (36.7 C) (Oral)  Resp 19  Wt 155 lb 8 oz (70.534 kg)  BMI 29.4 kg/m2  SpO2 97% Exam: General: NAD, elderly female  HEENT: [place]/AT, PERRL, EOMI, oropharynx clear,  Cardiovascular: S1S2, RRR, no murmurs, rubs, gallops,   Respiratory: CTAB, no extra work of breathing, no wheezes, crackles or rales  Abdomen: +BS, non tender, non distended, soft, no organomegaly  Extremities: Warm and well perfused, moves freely, strength 5/5 in UE/ LE. Normal sensation in UE/LE except for the medical aspect of her right foot.  Skin: no bruises or rashes  Neuro: CN 2-12 intact except for very mild asymmetry of mouth.  (-) Romberg, (-) pronator drift, normal gait, normal finger to nose testing, normal heel to shin, negative babinski, no clonus  Labs and Imaging: CBC BMET   Recent Labs Lab 06/28/13 1820 06/28/13 1831  WBC 5.3  --   HGB 11.7* 12.6  HCT 35.2* 37.0  PLT 275  --      Recent Labs Lab 06/28/13 1820 06/28/13 1831  NA 137 141  K 5.0 5.3*  CL 107 111  CO2 20  --   BUN 48* 49*  CREATININE 1.44* 1.70*  GLUCOSE 101* 101*  CALCIUM 9.1  --      CT head WITHOUT CONTRAST IMPRESSION:  No intracranial hemorrhage.  Small vessel disease type changes. Remote right lenticular nucleus  infarct. No CT evidence of large acute infarct.  Smaller fluid level left maxillary sinus.  Urinalysis    Component Value Date/Time   COLORURINE YELLOW 06/28/2013 1844   APPEARANCEUR CLOUDY* 06/28/2013 1844   LABSPEC 1.013 06/28/2013 1844   PHURINE 5.5 06/28/2013 1844   GLUCOSEU NEGATIVE 06/28/2013 1844   HGBUR NEGATIVE 06/28/2013 1844   BILIRUBINUR NEGATIVE 06/28/2013 1844   KETONESUR NEGATIVE 06/28/2013 1844   PROTEINUR NEGATIVE 06/28/2013 1844   UROBILINOGEN 0.2 06/28/2013 1844   NITRITE NEGATIVE 06/28/2013 1844   LEUKOCYTESUR MODERATE* 06/28/2013 1844    Urine culture: pending.   Cath Stuckey 09/16/11 Medical RX only small diagonal disease  Final Conclusions:  1. Preserved LV function  2. Calcification of the coronaries, with scattered plaque as noted.  3. 90% small D2, in a vessel of 1-1.60mm  4. 50% mid RCA.  Recommendations:  1. Medical therapy.   Clare Gandy, MD 06/28/2013, 10:45 PM PGY-1, [facility] Family Medicine FPTS Intern pager: [phone], text pages welcome  Patient seen, examined. Available data reviewed. Agree with findings, assessment, and plan as outlined by Dr. Jordan Likes.  My additional findings are documented and highlighted above in blue.  Marena Chancy, PGY-3 Family Medicine Resident

## 2013-06-28 NOTE — ED Provider Notes (Signed)
I have reviewed the report and personally reviewed the above radiology studies.    Hilario Quarry, MD 06/28/13 [phone]

## 2013-06-28 NOTE — Telephone Encounter (Signed)
Pt came in with daughter to pick medication from pharmacy. Mother c/o sudden jaw numbness radiating to left arm and hands last night lasting 5 minutes. Pt is currently taking lots of cardiac meds and may have taken too many per daughter. Pt being followed by Dr. Alben Spittle- cardiologist and ordered for imaging. I will check orders. Pt denies CP,n,v. Vss. Pt being sent to ER via POV for further evaluation.Report given to nurse first Marcella- Nonnie Done Rn

## 2013-06-28 NOTE — ED Notes (Signed)
Kirkpatrick, MD at bedside.  

## 2013-06-29 ENCOUNTER — Inpatient Hospital Stay (HOSPITAL_COMMUNITY): Payer: No Typology Code available for payment source

## 2013-06-29 DIAGNOSIS — G459 Transient cerebral ischemic attack, unspecified: Principal | ICD-10-CM

## 2013-06-29 DIAGNOSIS — I519 Heart disease, unspecified: Secondary | ICD-10-CM

## 2013-06-29 LAB — LIPID PANEL
Cholesterol: 183 mg/dL (ref 0–200)
HDL: 37 mg/dL — ABNORMAL LOW (ref >39)
LDL Cholesterol: 106 mg/dL — ABNORMAL HIGH (ref 0–99)
Total CHOL/HDL Ratio: 4.9 RATIO

## 2013-06-29 LAB — BASIC METABOLIC PANEL
BUN: 42 mg/dL — ABNORMAL HIGH (ref 6–23)
CO2: 19 mEq/L (ref 19–32)
Chloride: 109 mEq/L (ref 96–112)
Creatinine, Ser: 1.31 mg/dL — ABNORMAL HIGH (ref 0.50–1.10)
GFR calc non Af Amer: 40 mL/min — ABNORMAL LOW (ref >90)
Glucose, Bld: 101 mg/dL — ABNORMAL HIGH (ref 70–99)
Potassium: 4.8 mEq/L (ref 3.5–5.1)
Sodium: 138 mEq/L (ref 135–145)

## 2013-06-29 LAB — CBC
HCT: 32.6 % — ABNORMAL LOW (ref 36.0–46.0)
Hemoglobin: 10.7 g/dL — ABNORMAL LOW (ref 12.0–15.0)
MCHC: 32.8 g/dL (ref 30.0–36.0)
RBC: 3.6 MIL/uL — ABNORMAL LOW (ref 3.87–5.11)
WBC: 5.2 10*3/uL (ref 4.0–10.5)

## 2013-06-29 LAB — HEMOGLOBIN A1C
Hgb A1c MFr Bld: 6 % — ABNORMAL HIGH (ref <5.7)
Mean Plasma Glucose: 126 mg/dL — ABNORMAL HIGH (ref <117)

## 2013-06-29 LAB — TROPONIN I: Troponin I: <0.3 ng/mL (ref <0.30)

## 2013-06-29 MED ORDER — ATORVASTATIN CALCIUM 40 MG PO TABS
40.0000 mg | ORAL_TABLET | Freq: Every day | ORAL | Status: DC
  Administered 2013-06-29 – 2013-06-30 (×2): 40 mg via ORAL
  Filled 2013-06-29 (×3): qty 1

## 2013-06-29 MED ORDER — ATORVASTATIN CALCIUM 10 MG PO TABS: 40.0000 mg | ORAL_TABLET | Freq: Every day | ORAL | Status: DC

## 2013-06-29 NOTE — Evaluation (Signed)
Physical Therapy Evaluation Patient Details Name: Wendy Petty MRN: [id number] DOB: Nov 02, 1940 Today's Date: 06/29/2013 Time: 8657-8469 PT Time Calculation (min): 17 min  PT Assessment / Plan / Recommendation History of Present Illness  Wendy Petty is a 72 y.o. female presenting with left arm, right leg, and mouth numbness and tingling. 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 Neurology was consulted in ED.  PMH is significant for CAD treated medically, HTN, HL, hypothyroidism, prior TIA, depression.   Clinical Impression  Patient independent with mobility, not further Acute PT needs. Will sign off.     PT Assessment  Patent does not need any further PT services    Follow Up Recommendations  No PT follow up          Equipment Recommendations  None recommended by PT          Precautions / Restrictions Precautions Precautions: None Restrictions Weight Bearing Restrictions: No   Pertinent Vitals/Pain NAD      Mobility  Bed Mobility Bed Mobility: Supine to Sit;Sitting - Scoot to Edge of Bed Supine to Sit: 7: Independent Sitting - Scoot to [place] of Bed: 7: Independent Transfers Transfers: Sit to Stand;Stand to Sit Sit to Stand: 7: Independent Stand to Sit: 7: Independent Ambulation/Gait Ambulation/Gait Assistance: 7: Independent Ambulation Distance (Feet): 400 Feet Assistive device: None Gait Pattern: Within Functional Limits Gait velocity: wfl for community ambulation General Gait Details: steady Stairs: Yes Stairs Assistance: 7: Independent Stair Management Technique: No rails Number of Stairs: 5 (x2)       PT Goals(Current goals can be found in the care plan section) Acute Rehab PT Goals PT Goal Formulation: No goals set, d/c therapy  Visit Information  Last PT Received On: 06/29/13 Assistance Needed: +1 History of Present Illness: Wendy Petty is a 72 y.o. female presenting with left arm, right leg, and mouth numbness and  tingling. 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 Neurology was consulted in ED.  PMH is significant for CAD treated medically, HTN, HL, hypothyroidism, prior TIA, depression.        Prior Functioning  Home Living Family/patient expects to be discharged to:: Private residence Living Arrangements: Children Available Help at Discharge: Family Type of Home: House Home Layout: Two level Alternate Level Stairs-Number of Steps: 4 Alternate Level Stairs-Rails: Right Home Equipment: None Prior Function Level of Independence: Independent Communication Communication: Prefers language other than English Dominant Hand: Right    Cognition  Cognition Arousal/Alertness: Awake/alert Behavior During Therapy: WFL for tasks assessed/performed Overall Cognitive Status: Within Functional Limits for tasks assessed    Extremity/Trunk Assessment Upper Extremity Assessment Upper Extremity Assessment: Defer to OT evaluation Lower Extremity Assessment Lower Extremity Assessment: Overall WFL for tasks assessed   Balance Balance Balance Assessed: Yes Dynamic Sitting Balance Dynamic Sitting - Level of Assistance: 7: Independent Dynamic Sitting - Balance Activities: Reaching for objects;Reaching across midline Dynamic Sitting - Comments: independent with don/doff socks and dynamic movements EOB  End of Session PT - End of Session Equipment Utilized During Treatment: Gait belt Activity Tolerance: Patient tolerated treatment well Patient left:  (With OT and Drs) Nurse Communication: Mobility status  GP     Fabio Asa 06/29/2013, 11:21 AM Charlotte Crumb, PT DPT  [phone]

## 2013-06-29 NOTE — Progress Notes (Signed)
Stroke Team Progress Note  HISTORY Wendy Petty is a 72 y.o. female who had an episode that sounds like stroke in January of this year [place]. She had an episode last night where her right leg went numb then gradually resolved. Later, her left arm and face went numb and then gradually resolved. She states that this is similar to a previous episode that occurred in January. At that time, she had numbenss and weakness of her left arm that slowly resolved over the course of 2 months.   Today, the symptoms resolved with the exception of persistent left facial numbness which is still present at the time of my exam and which the patient states is new.   All history gained with assistance of interpreter.   LKW: Last night  tpa given: no, out of window  . She was admitted  for further evaluation and treatment.  SUBJECTIVE Her therapists is at the bedside (interpreting for her).  Overall she feels her condition is completely resolved.   OBJECTIVE Most recent Vital Signs: Filed Vitals:   06/28/13 2320 06/29/13 0101 06/29/13 0348 06/29/13 0530  BP: 113/59 117/64 111/61 112/68  Pulse: 68 71 75 77  Temp: 98.5 F (36.9 C) 98.2 F (36.8 C) 98.3 F (36.8 C) 97.4 F (36.3 C)  TempSrc: Oral Oral Oral Oral  Resp: 18  16 20   Height:      Weight:      SpO2: 100% 100% 100% 100%   CBG (last 3)   Recent Labs  06/28/13 1933  GLUCAP 88    IV Fluid Intake:   . sodium chloride 100 mL/hr at 06/29/13 0002    MEDICATIONS  . amLODipine  5 mg Oral Daily  . atorvastatin  10 mg Oral Daily  . carvedilol  12.5 mg Oral BID WC  . clopidogrel  75 mg Oral Daily  . FLUoxetine  20 mg Oral Daily  . heparin  5,000 Units Subcutaneous Q8H  . influenza vac split quadrivalent PF  0.5 mL Intramuscular Tomorrow-1000  . levothyroxine  25 mcg Oral QAC breakfast  . pantoprazole  40 mg Oral Daily   PRN:  nitroGLYCERIN  Diet:  Cardiac thin liquids Activity:  Bathroom privileges with  assistance DVT Prophylaxis:  Heparin SQ  CLINICALLY SIGNIFICANT STUDIES Basic Metabolic Panel:  Recent Labs Lab 06/28/13 1820 06/28/13 1831 06/29/13 0536  NA 137 141 138  K 5.0 5.3* 4.8  CL 107 111 109  CO2 20  --  19  GLUCOSE 101* 101* 101*  BUN 48* 49* 42*  CREATININE 1.44* 1.70* 1.31*  CALCIUM 9.1  --  8.9   Liver Function Tests:  Recent Labs Lab 06/28/13 1820  AST 18  ALT 14  ALKPHOS 75  BILITOT 0.3  PROT 8.1  ALBUMIN 3.9   CBC:  Recent Labs Lab 06/24/13 1107 06/28/13 1820 06/28/13 1831 06/29/13 0536  WBC 5.6 5.3  --  5.2  NEUTROABS 3.3 2.6  --   --   HGB 12.0 11.7* 12.6 10.7*  HCT 35.7* 35.2* 37.0 32.6*  MCV 89.2 89.6  --  90.6  PLT 264.0 275  --  250   Coagulation:  Recent Labs Lab 06/28/13 1820  LABPROT 13.6  INR 1.06   Cardiac Enzymes:  Recent Labs Lab 06/28/13 1820 06/28/13 2230 06/29/13 0536  TROPONINI <0.30 <0.30 <0.30   Urinalysis:  Recent Labs Lab 06/28/13 1844  COLORURINE YELLOW  LABSPEC 1.013  PHURINE 5.5  GLUCOSEU NEGATIVE  HGBUR NEGATIVE  BILIRUBINUR NEGATIVE  KETONESUR NEGATIVE  PROTEINUR NEGATIVE  UROBILINOGEN 0.2  NITRITE NEGATIVE  LEUKOCYTESUR MODERATE*   Lipid Panel    Component Value Date/Time   CHOL 183 06/29/2013 0536   TRIG 202* 06/29/2013 0536   HDL 37* 06/29/2013 0536   CHOLHDL 4.9 06/29/2013 0536   VLDL 40 06/29/2013 0536   LDLCALC 106* 06/29/2013 0536   HgbA1C  Lab Results  Component Value Date   HGBA1C 6.2* 12/31/2012    Urine Drug Screen:     Component Value Date/Time   LABOPIA NONE DETECTED 06/28/2013 1844   COCAINSCRNUR NONE DETECTED 06/28/2013 1844   LABBENZ NONE DETECTED 06/28/2013 1844   AMPHETMU NONE DETECTED 06/28/2013 1844   THCU NONE DETECTED 06/28/2013 1844   LABBARB NONE DETECTED 06/28/2013 1844    Alcohol Level:  Recent Labs Lab 06/28/13 1820  ETH <11    Ct Head Wo Contrast 06/28/2013    No intracranial hemorrhage.  Small vessel disease type changes. Remote right  lenticular nucleus infarct. No CT evidence of large acute infarct.  Smaller fluid level left maxillary sinus.     MRI of the brain   1. No acute intracranial abnormality.  2. Moderate nonspecific signal changes in the brain, with  superimposed chronic lacunar infarct in the right cerebellum. Favor  chronic small and medium-sized vessel ischemia  MRA of the brain   Mild to moderate intracranial artery dolichoectasia and  atherosclerosis. Mid basilar artery stenosis measuring up to 55-60 %  with respect to the distal vessel  2D Echocardiogram    Carotid Doppler  Findings suggest 1-39% internal carotid artery stenosis bilaterally. Vertebral arteries are patent with antegrade flow  CXR    EKG  normal sinus rhythm.   Therapy Recommendations   Physical Exam   Mental Status:  Patient is awake, alert, oriented to person, place, month, year, and situation.  Immediate and remote memory are intact.  Patient is able to give a clear and coherent history.  No signs of aphasia or neglect  Cranial Nerves:  II: Visual Fields are full. Pupils are equal, round, and reactive to light. Discs are without papilledema.  III,IV, VI: EOMI without ptosis or diploplia.  V: Facial sensation is symmetric to temperature  VII: Facial movement is symmetric.  VIII: hearing is intact to voice  X: Uvula elevates symmetrically  XI: Shoulder shrug is symmetric.  XII: tongue is midline without atrophy or fasciculations.  Motor:  Tone is normal. Bulk is normal. 5/5 strength was present in all four extremities without drift.  Sensory:  Sensation is symmetric to light touch and temperature in the arms and legs.  Deep Tendon Reflexes:  2+ and symmetric in the biceps and patellae.  Plantars:  Toes are downgoing bilaterally.  Cerebellar:  FNF are intact bilaterally  Gait:  Not assessed due to multiple medical monitors in ED setting.    ASSESSMENT Wendy Petty is a 72 y.o. female presenting with  transient right leg numbness, then left leg numbness/hemiparesis. MRI Imaging does not confirm infarct. Etiology of symptoms doubt cerebrovascular perhaps related to underlying anxiety.  On clopidogrel 75 mg orally every day prior to admission. Now on clopidogrel 75 mg orally every day for secondary stroke prevention. Patient with resultant transient symptoms.   Hypertension  Hyperlipidemia, LDL 106, goal <100, on statin  Anemia, chronic  Non-smoker   Abnormal urine, per primary team  HBG A1C   6.2  Coronary artery disease, medical management  hypothryoidism  Dehydration,  creatinine now improving  Hospital day # 1  TREATMENT/PLAN  Continue clopidogrel 75 mg orally every day for secondary stroke prevention.  Risk factor management  Therapy per recommendations  Echo pending.  Gwendolyn Lima. Manson Passey, [facility], MBA, MHA [facility] Pager: [phone] 06/29/2013 8:34 AM  I have personally obtained a history, examined the patient, evaluated imaging results, and formulated the assessment and plan of care. I agree with the above. Delia Heady, MD

## 2013-06-29 NOTE — Progress Notes (Signed)
*  PRELIMINARY RESULTS* Vascular Ultrasound Carotid Duplex (Doppler) has been completed.   Findings suggest 1-39% internal carotid artery stenosis bilaterally. Vertebral arteries are patent with antegrade flow.  06/29/2013 11:45 AM Gertie Fey, RVT, RDCS, RDMS

## 2013-06-29 NOTE — Discharge Summary (Signed)
Family Medicine Teaching [facility] Discharge Summary  Patient name: Wendy Petty Medical record number: [id number] Date of birth: [birth year] Age: 72 y.o. Gender: female Date of Admission: 06/28/2013  Date of Discharge: 06/29/2013 Admitting Physician: Carney Living, MD  Primary Care Provider: Jeanann Lewandowsky, MD Consultants: Neurology  Indication for Hospitalization:  TIA-like symptoms  Discharge Diagnoses/Problem List:  TIA Hypertension Hyperlipidemia Chronic anemia CAD s/p catheterization without stent placement Hypothyroidism Dehydration  Disposition: Discharge to home  Discharge Condition: Stable  Discharge Exam:  Gen: elderly, well-nourished female in NAD CV: RRR, II/VI SEM at RUSB, no JVD, 2+ radial and DP pulses bilaterally Pulm: Non-labored, CTAB Neuro: AAOx3, CN II-XII intact, no facial droop noted, Finger-nose & heel-shin test wnl, speech normal, gait normal. Abnormal sensation on L thumb.   Brief Hospital Course:  Wendy Petty is a 72 yo spanish-speaking female who presented to the ED on 10/20 after some episodes of transient numbness in her left hand and lower extremities concerning for a transient ischemic attack. She has a history of TIA in January 2014, CAD s/p catheterization without stent placement, HTN, hyperlipidemia, hypothyroidism, and depression.   Her symptoms had largely resolved upon arrival and continued to resolve overnight. The following morning she continued only to have abnormal sensation in the left thumb. No evidence of acute infarct was evident on CT, MRI, or MRA, and carotid dopplers were unremarkable (see below). She was continued on aspirin and plavix for secondary stroke prevention, though she had been taking these prior to these episodes. Valsartan-HCTZ was continued on discharge.   She also complained of some chronic chest pressure that had not changed from baseline. Cardiac enzymes were negative and her ECG remained  stable from prior ECG's. An echocardiogram showed no cardiac thrombus or PFO. No cardiology consultation was made, and Wendy Petty will follow up as scheduled with Dr. Eden Emms of [facility] cardiology.   Issues for Follow Up:  - Risk factor modification. LDL was 106 on lipitor 10mg , which was increased to 40mg  on discharge. Hemoglobin A1c was 6.2, at goal.  - Monitor functional status. PT, OT, and speech therapy recommended no further follow up was necessary.   Significant Procedures: None  Significant Labs and Imaging:   Recent Labs Lab 06/24/13 1107 06/28/13 1820 06/28/13 1831 06/29/13 0536  WBC 5.6 5.3  --  5.2  HGB 12.0 11.7* 12.6 10.7*  HCT 35.7* 35.2* 37.0 32.6*  PLT 264.0 275  --  250    Recent Labs Lab 06/24/13 1107 06/28/13 1820 06/28/13 1831 06/29/13 0536  NA 139 137 141 138  K 5.2* 5.0 5.3* 4.8  CL 108 107 111 109  CO2 22 20  --  19  GLUCOSE 106* 101* 101* 101*  BUN 36* 48* 49* 42*  CREATININE 1.3* 1.44* 1.70* 1.31*  CALCIUM 9.4 9.1  --  8.9  ALKPHOS  --  75  --   --   AST  --  18  --   --   ALT  --  14  --   --   ALBUMIN  --  3.9  --   --    Ct Head Wo Contrast  06/28/2013 No intracranial hemorrhage. Small vessel disease type changes. Remote right lenticular nucleus infarct. No CT evidence of large acute infarct. Smaller fluid level left maxillary sinus.   MRI of the brain  1. No acute intracranial abnormality.  2. Moderate nonspecific signal changes in the brain, with  superimposed chronic lacunar infarct in the right cerebellum.  Favor  chronic small and medium-sized vessel ischemia   MRA of the brain  Mild to moderate intracranial artery dolichoectasia and  atherosclerosis. Mid basilar artery stenosis measuring up to 55-60 %  with respect to the distal vessel   2D Echocardiogram   - Left ventricle: The cavity size was normal. Wall thickness was normal. Systolic function was normal. The estimated ejection fraction was in the range of 55% to 65%.  Wall motion was normal; there were no regional wall motion abnormalities. Doppler parameters are consistent with abnormal left ventricular relaxation (grade 1 diastolic dysfunction). - Atrial septum: No defect or patent foramen ovale was identified. Impressions:  - No evidence of valve disease. No cardiac source of embolism was identified, but cannot be ruled out on the basis of this examination.  Carotid Doppler Findings suggest 1-39% internal carotid artery stenosis bilaterally. Vertebral arteries are patent with antegrade flow  Results/Tests Pending at Time of Discharge: Urine culture  Discharge Medications:    Medication List         amLODipine 5 MG tablet  Commonly known as:  NORVASC  Take 1 tablet (5 mg total) by mouth daily.     atorvastatin 10 MG tablet  Commonly known as:  LIPITOR  Take 4 tablets (40 mg total) by mouth daily.     carvedilol 12.5 MG tablet  Commonly known as:  COREG  Take 1 tablet (12.5 mg total) by mouth 2 (two) times daily with a meal.     clopidogrel 75 MG tablet  Commonly known as:  PLAVIX  Take 1 tablet (75 mg total) by mouth daily.     FLUoxetine 20 MG capsule  Commonly known as:  PROZAC  Take 1 capsule (20 mg total) by mouth daily.     fluticasone 50 MCG/ACT nasal spray  Commonly known as:  FLONASE  Place 2 sprays into the nose daily.     levothyroxine 25 MCG tablet  Commonly known as:  SYNTHROID, LEVOTHROID  Take 1 tablet (25 mcg total) by mouth daily before breakfast.     nitroGLYCERIN 0.4 MG SL tablet  Commonly known as:  NITROSTAT  Place 1 tablet (0.4 mg total) under the tongue every 5 (five) minutes as needed for chest pain.     OVER THE COUNTER MEDICATION  Take 1 capsule by mouth daily. "Dolo Neuro" cap     pantoprazole 40 MG tablet  Commonly known as:  PROTONIX  Take 40 mg by mouth daily.     spironolactone 25 MG tablet  Commonly known as:  ALDACTONE  Take 1 tablet (25 mg total) by mouth daily.      valsartan-hydrochlorothiazide 320-25 MG per tablet  Commonly known as:  DIOVAN-HCT  Take 1 tablet by mouth daily.        Discharge Instructions: Please refer to Patient Instructions section of EMR for full details.  Patient was counseled important signs and symptoms that should prompt return to medical care, changes in medications, dietary instructions, activity restrictions, and follow up appointments.   Follow-Up Appointments: Follow-up Information   Follow up with Jeanann Lewandowsky, MD On 07/08/2013. (4:45pm)    Specialty:  Internal Medicine   Contact information:   [address] [place] [address] [phone]       Follow up with Charlton Haws, MD. (as scheduled)    Specialty:  Cardiology   Contact information:   [address] [place] [address] [phone]       Hazeline Junker, MD  06/30/2013, 9:30 AM PGY-1, [facility] Family Medicine

## 2013-06-29 NOTE — Progress Notes (Signed)
Family Medicine Teaching Service Daily Progress Note Intern Pager: [phone]  Patient name: Wendy Petty Medical record number: [id number] Date of birth: 1940/09/20 Age: 72 y.o. Gender: female  Primary Care Provider: Jeanann Lewandowsky, MD Consultants: Neurology Code Status: Full  Pt Overview and Major Events to Date:  10/20: Admitted with TIA-like symptoms  Assessment and Plan: Wendy Petty is a 72 y.o. female presenting with transient numbness of left arm, right leg, and mouth concerning for TIA. PMH is significant for prior TIA Jan 2014, CAD with catheterization without stent placement, HTN, HL, hypothyroidism, and depression.   #TIA: History of prior TIA in January with no residual deficits. Persistent oral facial numbness but no deficits on exam. 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  - continue ASA and plavix 75 mg (on previously), statin - No telemetry events overnight  - MRI/MRA: negative for acute ischemic stroke. Likely chronic small/medium vessel ischemia and mild-moderate atherosclerosis. Mid-basilar artery stenosis of 55-60%.  - LDL 106, Hb A1c pending - Carotid dopplers today - 2D echocardiogram today (last ECHO in 2013 EF: 55%)  - greatly appreciate Neuro recommendations - PT/OT/SLP evaluations today  #Stable angina/CAD: reports a history of chronic, stable chest pressure. Followed by Dr. Eden Emms. Just seen on 10/16 with f/up in 1 month.  - ECG is NSR and unchanged from previous. VSS o/n - Troponins negative - continue home medications: continue coreg, Prontinx, nitro 0.4   #Hypothyroidism: stable  - TSH pending, last TSH January 2013 1.163  - continue home medication, levothyroxine 25 mcg   #HTN: stable with medications  - hold home Valsartan-HCTZ due to AKI and spironolactone 25 mg due to recently elevated K.  - continue: Carvediolol 12.5, Amlodipine 5mg . Hold imdur since it is not clear whether she is taking it.   #HLD: stable with  medications  - lipid panel  - continue lipitor 10 mg   #AKI: Cr improving overnight to 1.31 (baseline .8-.9) - holding valsartan-HCTZ  - Electrolytes wnl  #Depression: stable  - continue home medications: fluoxetine 20 mg daily   FEN/GI: NPO pending Speech eval, NS 100 mL/hr  Prophylaxis: hep subQ  Disposition: Continued neurological evaluation today on telemetry  Subjective: Communication occurred through spanish interpreter. Pt endorses L thumb "not feeling right" but denies pain or weakness. All other areas of numbness feel normal now. Denies CP, SOB, HA, dizziness.   Objective: Temp:  [97.4 F (36.3 C)-98.5 F (36.9 C)] 97.4 F (36.3 C) (10/21 0530) Pulse Rate:  [68-77] 77 (10/21 0530) Resp:  [14-20] 20 (10/21 0530) BP: (109-142)/(56-90) 112/68 mmHg (10/21 0530) SpO2:  [97 %-100 %] 100 % (10/21 0530) Weight:  [155 lb 8 oz (70.534 kg)-156 lb 15.5 oz (71.2 kg)] 156 lb 15.5 oz (71.2 kg) (10/20 2300) Physical Exam: General: elderly female in NAD  Cardiovascular: RRR, no murmurs, rubs, gallops, 2+ radial and DP pulses Respiratory: CTAB, no extra work of breathing, no wheezes, crackles or rales  Abdomen: +BS, non tender, non distended, soft, no organomegaly  Skin: no bruises or rashes  Neuro: AAOx3, CN 2-12 intact. Altered sensation on L thumb without pain or thenar atrophy. Speech and gait normal, normal finger to nose testing  Laboratory:  Recent Labs Lab 06/24/13 1107 06/28/13 1820 06/28/13 1831 06/29/13 0536  WBC 5.6 5.3  --  5.2  HGB 12.0 11.7* 12.6 10.7*  HCT 35.7* 35.2* 37.0 32.6*  PLT 264.0 275  --  250    Recent Labs Lab 06/24/13 1107 06/28/13 1820 06/28/13 1831  06/29/13 0536  NA 139 137 141 138  K 5.2* 5.0 5.3* 4.8  CL 108 107 111 109  CO2 22 20  --  19  BUN 36* 48* 49* 42*  CREATININE 1.3* 1.44* 1.70* 1.31*  CALCIUM 9.4 9.1  --  8.9  PROT  --  8.1  --   --   BILITOT  --  0.3  --   --   ALKPHOS  --  75  --   --   ALT  --  14  --   --   AST   --  18  --   --   GLUCOSE 106* 101* 101* 101*   Urinalysis  Imaging/Diagnostic Tests: MRI HEAD WITHOUT CONTRAST  MRA HEAD WITHOUT CONTRAST  TECHNIQUE:  Multiplanar, multiecho pulse sequences of the brain and surrounding  structures were obtained without intravenous contrast. Angiographic  images of the head were obtained using MRA technique without  contrast.  COMPARISON: Head CT 06/2013.  FINDINGS:  MRI HEAD FINDINGS  Cerebral volume is within normal limits for age. No restricted  diffusion to suggest acute infarction. No midline shift, mass  effect, evidence of mass lesion, ventriculomegaly, extra-axial  collection or acute intracranial hemorrhage. Cervicomedullary  junction and pituitary are within normal limits. Negative visualized  cervical spine. Major intracranial vascular flow voids are  preserved, there is a degree of generalized intracranial artery  dolichoectasia. The basilar artery appears most affected.  Bilateral patchy and confluent cerebral white matter T2 and FLAIR  hyperintensity. No cortical encephalomalacia. T2 heterogeneity in  the deep gray matter nuclei, primarily the thalamus. Diffusion is  facilitated. Small chronic micro hemorrhage on the left. Similar  patchy T2 hyperintensity in the brainstem. Small chronic lacunar  infarct in the posterior right cerebellum.  Visualized orbit soft tissues are within normal limits. Normal bone  marrow signal. Mastoids are clear. Mild paranasal sinus mucosal  thickening. Negative scalp soft tissues.  MRA HEAD FINDINGS  Decreased signal to noise in the posterior fossa on this study.  Antegrade flow signal in both distal vertebral arteries, the left  appears dominant. Patent vertebrobasilar junction.  Mildly to moderately dolichoectatic basilar artery. Mid basilar  irregularity and mild stenosis, measuring up to 55-60 % with respect  to the distal vessel . SCA and PCA origins are within normal limits.  Posterior  communicating arteries are diminutive or absent. Bilateral  PCA branches are within normal limits.  Antegrade flow in both ICA siphons. Tortuous distal cervical ICAs.  Mildly dolichoectatic ICA siphons and carotid termini. Normal  ophthalmic artery origins. No ICA siphon stenosis.  MCA and ACA origins are within normal limits. Tortuous proximal ACA  vessels. Anterior communicating artery within normal limits. Other  visualized ACA branches within normal limits. Mild irregularity of  both MCA M1 segments. Otherwise the visualized MCA branches are  within normal limits.   MRI HEAD IMPRESSION  1. No acute intracranial abnormality.  2. Moderate nonspecific signal changes in the brain, with  superimposed chronic lacunar infarct in the right cerebellum. Favor  chronic small and medium-sized vessel ischemia.  MRA HEAD IMPRESSION  Mild to moderate intracranial artery dolichoectasia and  atherosclerosis. Mid basilar artery stenosis measuring up to 55-60 %  with respect to the distal vessel.  CT HEAD WITHOUT CONTRAST  IMPRESSION:  No intracranial hemorrhage.  Small vessel disease type changes. Remote right lenticular nucleus  infarct. No CT evidence of large acute infarct.  Smaller fluid level left maxillary sinus.   Wendy Junker, MD  06/29/2013, 8:18 AM PGY-1, [facility] Family Medicine FPTS Intern pager: [phone], text pages welcome

## 2013-06-29 NOTE — H&P (Signed)
Family Medicine Teaching Service Attending Note  I interviewed and examined patient Wendy Petty and reviewed their tests and x-rays.  I discussed with Dr. Gwenlyn Saran and reviewed their note for today.  I agree with their assessment and plan.     Additionally  No distress Neuro exam nonfocal and intact  TIA vs CVA vs Cervical Impingement with hyperventilation peri oral numbness Very difficult when only subjective paresthesias Work up for possible CVA Maximize risk factor reductions

## 2013-06-29 NOTE — Progress Notes (Signed)
Nutrition Brief Note  Patient identified on the Malnutrition Screening Tool (MST) Report for recent weight lost without trying (patient unsure) and eating poorly because of a decreased appetite.  Per readings below, patient's weight has been trending up.  Wt Readings from Last 15 Encounters:  06/28/13 156 lb 15.5 oz (71.2 kg)  06/24/13 153 lb 12.8 oz (69.763 kg)  06/10/13 152 lb (68.947 kg)  01/11/13 147 lb 12.8 oz (67.042 kg)  10/03/11 155 lb (70.308 kg)  09/15/11 160 lb 15 oz (73 kg)  09/15/11 160 lb 15 oz (73 kg)  11/02/10 151 lb 12 oz (68.833 kg)  09/26/09 151 lb (68.493 kg)  04/11/09 151 lb (68.493 kg)  04/05/09 153 lb (69.4 kg)  04/03/09 152 lb (68.947 kg)  02/17/09 146 lb (66.225 kg)    Body mass index is 30.66 kg/(m^2). Patient meets criteria for Obesity Class I based on current BMI.   Current diet order is Heart Healthy, patient is consuming approximately 75% of meals at this time. Labs and medications reviewed.   No nutrition interventions warranted at this time. If nutrition issues arise, please consult RD.   Maureen Chatters, RD, LDN Pager #: [phone] After-Hours Pager #: [phone]

## 2013-06-29 NOTE — Evaluation (Signed)
Occupational Therapy Evaluation Patient Details Name: Wendy Petty MRN: [id number] DOB: 1941-03-03 Today's Date: 06/29/2013 Time: 6962-9528 OT Time Calculation (min): 13 min  OT Assessment / Plan / Recommendation History of present illness Wendy Petty is a 72 y.o. female presenting with left arm, right leg, and mouth numbness and tingling. 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 Neurology was consulted in ED.  PMH is significant for CAD treated medically, HTN, HL, hypothyroidism, prior TIA, depression.    Clinical Impression   Patient evaluated by Occupational Therapy with no further acute OT needs identified. All education has been completed and the patient has no further questions. See below for any follow-up Occupational Therapy or equipment needs. OT to sign off. Thank you for referral.      OT Assessment  Patient does not need any further OT services    Follow Up Recommendations  No OT follow up    Barriers to Discharge      Equipment Recommendations  None recommended by OT    Recommendations for Other Services    Frequency       Precautions / Restrictions Precautions Precautions: None Restrictions Weight Bearing Restrictions: No   Pertinent Vitals/Pain None reported    ADL  Eating/Feeding: Independent Where Assessed - Eating/Feeding: Chair Grooming: Wash/dry hands;Wash/dry face;Independent Where Assessed - Grooming: Unsupported standing Lower Body Dressing: Independent Where Assessed - Lower Body Dressing: Unsupported sit to stand Toilet Transfer: Independent Toilet Transfer Method: Sit to Barista: Regular height toilet Toileting - Clothing Manipulation and Hygiene: Independent Where Assessed - Toileting Clothing Manipulation and Hygiene: Sit to stand from 3-in-1 or toilet Transfers/Ambulation Related to ADLs: Pt ambulating independently ADL Comments: Pt completed bed mobility, toilet transfer, sink level grooming and  transfer to chair independent. No further OT needs    OT Diagnosis:    OT Problem List:   OT Treatment Interventions:     OT Goals(Current goals can be found in the care plan section)    Visit Information  Last OT Received On: 06/29/13 Assistance Needed: +1 History of Present Illness: Wendy Petty is a 72 y.o. female presenting with left arm, right leg, and mouth numbness and tingling. 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 Neurology was consulted in ED.  PMH is significant for CAD treated medically, HTN, HL, hypothyroidism, prior TIA, depression.        Prior Functioning     Home Living Family/patient expects to be discharged to:: Private residence Living Arrangements: Children Available Help at Discharge: Family Type of Home: House Home Layout: Two level Alternate Level Stairs-Number of Steps: 4 Alternate Level Stairs-Rails: Right Home Equipment: None Prior Function Level of Independence: Independent Communication Communication: Prefers language other than English Dominant Hand: Right         Vision/Perception Vision - History Baseline Vision: Wears glasses all the time Patient Visual Report: No change from baseline   Cognition  Cognition Arousal/Alertness: Awake/alert Behavior During Therapy: WFL for tasks assessed/performed Overall Cognitive Status: Within Functional Limits for tasks assessed    Extremity/Trunk Assessment Upper Extremity Assessment Upper Extremity Assessment: LUE deficits/detail;Overall [facility] for tasks assessed LUE Deficits / Details: reports numbness at the left thumb and decr strength that started in January 2014 Lower Extremity Assessment Lower Extremity Assessment: Defer to PT evaluation Cervical / Trunk Assessment Cervical / Trunk Assessment: Normal     Mobility Bed Mobility Bed Mobility: Supine to Sit;Sitting - Scoot to Edge of Bed Supine to Sit: 7: Independent Sitting - Scoot  to Edge of Bed: 7:  Independent Transfers Transfers: Sit to Stand;Stand to Sit Sit to Stand: 7: Independent Stand to Sit: 7: Independent     Exercise     Balance Balance Balance Assessed: Yes Dynamic Sitting Balance Dynamic Sitting - Level of Assistance: 7: Independent Dynamic Sitting - Balance Activities: Reaching for objects;Reaching across midline Dynamic Sitting - Comments: independent with don/doff socks and dynamic movements EOB   End of Session OT - End of Session Activity Tolerance: Patient tolerated treatment well Patient left: in chair;with call bell/phone within reach  GO     Harolyn Rutherford 06/29/2013, 11:54 AM Pager: [phone]

## 2013-06-29 NOTE — Progress Notes (Signed)
   CARE MANAGEMENT NOTE 06/29/2013  Patient:  Wendy Petty, Wendy Petty   Account Number:  [id number]  Date Initiated:  06/29/2013  Documentation initiated by:  Jiles Crocker  Subjective/Objective Assessment:   ADMITTED FOR RULE OUT CVA     Action/Plan:   PCP IS DR OLUGBEMIGA JEGEDE;  LIVES WITH FAMILY MEMBERS; CM FOLLOWING FOR DCP   Anticipated DC Date:  07/02/2013   Anticipated DC Plan:  POSSIBLY HOME W HOME HEALTH SERVICES; AWAITING ON PT/OT EVALS;    DC Planning Services  CM consult         Status of service:  In process, will continue to follow Medicare Important Message given?  NA - LOS <3 / Initial given by admissions (If response is "NO", the following Medicare IM given date fields will be blank)  Per UR Regulation:  Reviewed for med. necessity/level of care/duration of stay  Comments:  10/21/2014Abelino Derrick RN,BSN,MHA [phone]

## 2013-06-29 NOTE — Progress Notes (Signed)
FMTS Attending Daily Note:  Jeff Jennavie Martinek MD  [phone] pager  Family Practice pager:  [phone] I have discussed this patient with the resident Dr. Grunz and attending physician Dr. Chambliss.  I agree with their findings, assessment, and care plan  

## 2013-06-30 LAB — URINE CULTURE

## 2013-06-30 NOTE — Progress Notes (Addendum)
Stroke Team Progress Note  HISTORY Rheagan A Lily Petty is a 72 y.o. female who had an episode that sounds like stroke in January of this year [place]. She had an episode last night where her right leg went numb then gradually resolved. Later, her left arm and face went numb and then gradually resolved. She states that this is similar to a previous episode that occurred in January. At that time, she had numbenss and weakness of her left arm that slowly resolved over the course of 2 months.   Today, the symptoms resolved with the exception of persistent left facial numbness which is still present at the time of my exam and which the patient states is new.   All history gained with assistance of interpreter.   LKW: Last night  tpa given: no, out of window  . She was admitted  for further evaluation and treatment.  SUBJECTIVE Patient sitting at bedside. Family in room (interprets for patient). Patient conveys she feels better. Wanting to go home.  OBJECTIVE Most recent Vital Signs: Filed Vitals:   06/29/13 1734 06/29/13 2144 06/30/13 0110 06/30/13 0541  BP: 124/77 99/51 118/57 120/65  Pulse: 73 72 69 66  Temp: 97.7 F (36.5 C) 98.3 F (36.8 C) 98.4 F (36.9 C) 97.7 F (36.5 C)  TempSrc: Oral Oral Oral Oral  Resp: 17 16 16 16   Height:      Weight:      SpO2: 100% 99% 99% 100%   CBG (last 3)   Recent Labs  06/28/13 1933 06/29/13 1126  GLUCAP 88 102*    IV Fluid Intake:   . sodium chloride 100 mL/hr at 06/29/13 0002    MEDICATIONS  . amLODipine  5 mg Oral Daily  . atorvastatin  40 mg Oral Daily  . carvedilol  12.5 mg Oral BID WC  . clopidogrel  75 mg Oral Daily  . FLUoxetine  20 mg Oral Daily  . heparin  5,000 Units Subcutaneous Q8H  . influenza vac split quadrivalent PF  0.5 mL Intramuscular Tomorrow-1000  . levothyroxine  25 mcg Oral QAC breakfast  . pantoprazole  40 mg Oral Daily   PRN:  nitroGLYCERIN  Diet:  Cardiac thin liquids Activity:   Bathroom privileges with assistance DVT Prophylaxis:  Heparin SQ  CLINICALLY SIGNIFICANT STUDIES Basic Metabolic Panel:   Recent Labs Lab 06/28/13 1820 06/28/13 1831 06/29/13 0536  NA 137 141 138  K 5.0 5.3* 4.8  CL 107 111 109  CO2 20  --  19  GLUCOSE 101* 101* 101*  BUN 48* 49* 42*  CREATININE 1.44* 1.70* 1.31*  CALCIUM 9.1  --  8.9   Liver Function Tests:   Recent Labs Lab 06/28/13 1820  AST 18  ALT 14  ALKPHOS 75  BILITOT 0.3  PROT 8.1  ALBUMIN 3.9   CBC:   Recent Labs Lab 06/24/13 1107 06/28/13 1820 06/28/13 1831 06/29/13 0536  WBC 5.6 5.3  --  5.2  NEUTROABS 3.3 2.6  --   --   HGB 12.0 11.7* 12.6 10.7*  HCT 35.7* 35.2* 37.0 32.6*  MCV 89.2 89.6  --  90.6  PLT 264.0 275  --  250   Coagulation:   Recent Labs Lab 06/28/13 1820  LABPROT 13.6  INR 1.06   Cardiac Enzymes:   Recent Labs Lab 06/28/13 2230 06/29/13 0536 06/29/13 1235  TROPONINI <0.30 <0.30 <0.30   Urinalysis:   Recent Labs Lab 06/28/13 1844  COLORURINE YELLOW  LABSPEC 1.013  PHURINE 5.5  GLUCOSEU NEGATIVE  HGBUR NEGATIVE  BILIRUBINUR NEGATIVE  KETONESUR NEGATIVE  PROTEINUR NEGATIVE  UROBILINOGEN 0.2  NITRITE NEGATIVE  LEUKOCYTESUR MODERATE*   Lipid Panel    Component Value Date/Time   CHOL 183 06/29/2013 0536   TRIG 202* 06/29/2013 0536   HDL 37* 06/29/2013 0536   CHOLHDL 4.9 06/29/2013 0536   VLDL 40 06/29/2013 0536   LDLCALC 106* 06/29/2013 0536   HgbA1C  Lab Results  Component Value Date   HGBA1C 6.0* 06/29/2013    Urine Drug Screen:     Component Value Date/Time   LABOPIA NONE DETECTED 06/28/2013 1844   COCAINSCRNUR NONE DETECTED 06/28/2013 1844   LABBENZ NONE DETECTED 06/28/2013 1844   AMPHETMU NONE DETECTED 06/28/2013 1844   THCU NONE DETECTED 06/28/2013 1844   LABBARB NONE DETECTED 06/28/2013 1844    Alcohol Level:   Recent Labs Lab 06/28/13 1820  ETH <11    Ct Head Wo Contrast 06/28/2013    No intracranial hemorrhage.  Small vessel  disease type changes. Remote right lenticular nucleus infarct. No CT evidence of large acute infarct.  Smaller fluid level left maxillary sinus.     MRI of the brain   1. No acute intracranial abnormality.  2. Moderate nonspecific signal changes in the brain, with  superimposed chronic lacunar infarct in the right cerebellum. Favor  chronic small and medium-sized vessel ischemia  MRA of the brain   Mild to moderate intracranial artery dolichoectasia and  atherosclerosis. Mid basilar artery stenosis measuring up to 55-60 %  with respect to the distal vessel  2D Echocardiogram  EF 55%, no ASD or PFO. No regional wall motion abnormalities.  Carotid Doppler  Findings suggest 1-39% internal carotid artery stenosis bilaterally. Vertebral arteries are patent with antegrade flow  CXR    EKG  normal sinus rhythm.   Therapy Recommendations   Physical Exam   Mental Status:  Patient is awake, alert, oriented to person, place, month, year, and situation.  Immediate and remote memory are intact.  Patient is able to give a clear and coherent history.  No signs of aphasia or neglect  Cranial Nerves:  II: Visual Fields are full. Pupils are equal, round, and reactive to light. Discs are without papilledema.  III,IV, VI: EOMI without ptosis or diploplia.  V: Facial sensation is symmetric to temperature  VII: Facial movement is symmetric.  VIII: hearing is intact to voice  X: Uvula elevates symmetrically  XI: Shoulder shrug is symmetric.  XII: tongue is midline without atrophy or fasciculations.  Motor:  Tone is normal. Bulk is normal. 5/5 strength was present in all four extremities without drift.  Sensory:  Sensation is symmetric to light touch and temperature in the arms and legs.  Deep Tendon Reflexes:  2+ and symmetric in the biceps and patellae.  Plantars:  Toes are downgoing bilaterally.  Cerebellar:  FNF are intact bilaterally  Gait:  Not assessed due to multiple medical monitors  in ED setting.    ASSESSMENT [name] is a 72 y.o. female presenting with transient right leg numbness, then left leg numbness/hemiparesis. MRI Imaging does not confirm infarct. Etiology of symptoms doubt cerebrovascular perhaps related to underlying anxiety.  On clopidogrel 75 mg orally every day prior to admission. Now on clopidogrel 75 mg orally every day for secondary stroke prevention. Patient with resultant transient symptoms.   Hypertension  Hyperlipidemia, LDL 106, goal <100, on statin  Anemia, chronic  Non-smoker   Abnormal  urine, per primary team  HBG A1C   6.2  Coronary artery disease, medical management  hypothryoidism  Dehydration, creatinine now improving  Anxiety, on medications.  Hospital day # 2  TREATMENT/PLAN  Continue clopidogrel 75 mg orally every day for secondary stroke prevention.  Risk factor management  Stroke service will sign off. D/W family practice medical resident  Have patient follow up with primary provider.  Gwendolyn Lima. Manson Passey, [facility], MBA, MHA [facility] Pager: [phone] 06/30/2013 8:18 AM  I have personally obtained a history, examined the patient, evaluated imaging results, and formulated the assessment and plan of care. I agree with the above.  Delia Heady, MD

## 2013-06-30 NOTE — Plan of Care (Signed)
D/c instructions given to pt, v/u. Med script given to pt.

## 2013-07-01 ENCOUNTER — Telehealth: Payer: Self-pay | Admitting: Family Medicine

## 2013-07-01 MED ORDER — CEPHALEXIN 500 MG PO CAPS: 500.0000 mg | ORAL_CAPSULE | Freq: Three times a day (TID) | ORAL | Status: DC

## 2013-07-01 NOTE — Discharge Summary (Signed)
Family Medicine Teaching Service  Discharge Note : Attending Jeff Ashlin Kreps MD Pager [phone] Inpatient Team Pager:  [phone]  I have reviewed this patient and the patient's chart and have discussed discharge planning with the resident at the time of discharge. I agree with the discharge plan as above.    

## 2013-07-01 NOTE — Telephone Encounter (Signed)
Tried to call patient but went to voice mail. [employer], please call patient to let her know I prescribed keflex TID x 7 days for UTI, sent to [facility] on [address] because the listed pharmacy did not take eprescriptions and was listed as disconnected when I called it. Thanks. Leona Singleton, MD 07/01/2013 6:30 PM

## 2013-07-02 NOTE — Telephone Encounter (Signed)
LVM hopefully pt will call us back. ° °Marines °

## 2013-07-06 ENCOUNTER — Encounter: Payer: Self-pay | Admitting: Cardiovascular Disease

## 2013-07-08 ENCOUNTER — Ambulatory Visit: Payer: No Typology Code available for payment source | Attending: Internal Medicine | Admitting: Internal Medicine

## 2013-07-08 VITALS — BP 144/73 | HR 77 | Temp 98.5°F | Resp 14 | Wt 152.6 lb

## 2013-07-08 DIAGNOSIS — Z23 Encounter for immunization: Secondary | ICD-10-CM

## 2013-07-08 DIAGNOSIS — G459 Transient cerebral ischemic attack, unspecified: Secondary | ICD-10-CM

## 2013-07-08 LAB — CBC
HCT: 35 % — ABNORMAL LOW (ref 36.0–46.0)
MCHC: 33.4 g/dL (ref 30.0–36.0)
MCV: 87.5 fL (ref 78.0–100.0)
Platelets: 293 10*3/uL (ref 150–400)
RBC: 4 MIL/uL (ref 3.87–5.11)
RDW: 14.2 % (ref 11.5–15.5)
WBC: 5.2 10*3/uL (ref 4.0–10.5)

## 2013-07-08 MED ORDER — FLUTICASONE PROPIONATE 50 MCG/ACT NA SUSP: 2.0000 | Freq: Every day | NASAL | Status: DC

## 2013-07-08 MED ORDER — CLOPIDOGREL BISULFATE 75 MG PO TABS: 75.0000 mg | ORAL_TABLET | Freq: Every day | ORAL | Status: DC

## 2013-07-08 MED ORDER — PANTOPRAZOLE SODIUM 40 MG PO TBEC: 40.0000 mg | DELAYED_RELEASE_TABLET | Freq: Every day | ORAL | Status: DC

## 2013-07-08 MED ORDER — ATORVASTATIN CALCIUM 40 MG PO TABS: 40.0000 mg | ORAL_TABLET | Freq: Every day | ORAL | Status: DC

## 2013-07-08 MED ORDER — CARVEDILOL 12.5 MG PO TABS: 12.5000 mg | ORAL_TABLET | Freq: Two times a day (BID) | ORAL | Status: DC

## 2013-07-08 MED ORDER — NITROGLYCERIN 0.4 MG SL SUBL: 0.4000 mg | SUBLINGUAL_TABLET | SUBLINGUAL | Status: DC | PRN

## 2013-07-08 MED ORDER — AMLODIPINE BESYLATE 10 MG PO TABS: 10.0000 mg | ORAL_TABLET | Freq: Every day | ORAL | Status: DC

## 2013-07-08 MED ORDER — LEVOTHYROXINE SODIUM 25 MCG PO TABS: 25.0000 ug | ORAL_TABLET | Freq: Every day | ORAL | Status: DC

## 2013-07-08 MED ORDER — FLUOXETINE HCL 20 MG PO CAPS: 20.0000 mg | ORAL_CAPSULE | Freq: Every day | ORAL | Status: DC

## 2013-07-08 MED ORDER — HYDRALAZINE HCL 50 MG PO TABS: 50.0000 mg | ORAL_TABLET | Freq: Three times a day (TID) | ORAL | Status: DC

## 2013-07-08 NOTE — Progress Notes (Unsigned)
Patient here for physical History of HTN Coronary atherosclerosis of native coronary artery Recently discharged  From hospital  Has been having a feeling of cold sensation to her right arm With some tingling just not feeling right

## 2013-07-08 NOTE — Progress Notes (Unsigned)
Patient ID: Wendy Petty, female   DOB: [birth year], 72 y.o.   MRN: [id number]  Patient Demographics  Wendy Petty, is a 72 y.o. female  CSN: [id number]  MRN: [id number]  DOB - 10/11/1940  Outpatient Primary MD for the patient is Jeanann Lewandowsky, MD   With History of -  Past Medical History  Diagnosis Date  . Hypertension   . GERD (gastroesophageal reflux disease)   . HYPERLIPIDEMIA   . THYROID NODULE   . PULMONARY NODULE   . Edema   . CHEST PAIN UNSPECIFIED   . ANEMIA, CHRONIC       Past Surgical History  Procedure Laterality Date  . Vaginal hysterectomy  1989  . Tubal ligation      in for   Chief Complaint  Patient presents with  . Annual Exam     HPI  Wendy Petty  is a 72 y.o. female, with history of hypertension, dyslipidemia, GERD, hypothyroidism, stable pulmonary and thyroid nodule, who was recently admitted for a TIA diagnosis, comes in to establish care, at the hospital for TIA workup was essentially unremarkable and she was seen by neurology. She was discharged home on Plavix along with statin for secondary prevention. Today she is completely negative review of systems except for some generalized weakness.    Review of Systems    In addition to the HPI above,   No Fever-chills, No Headache, No changes with Vision or hearing, No problems swallowing food or Liquids, No Chest pain, Cough or Shortness of Breath, No Abdominal pain, No Nausea or Vommitting, Bowel movements are regular, No Blood in stool or Urine, No dysuria, No new skin rashes or bruises, No new joints pains-aches,  No new weakness, tingling, numbness in any extremity, she does have some generalized weakness No recent weight gain or loss, No polyuria, polydypsia or polyphagia, No significant Mental Stressors.  A full 10 point Review of Systems was done, except as stated above, all other Review of Systems were negative.   Social History History  Substance Use Topics  .  Smoking status: Never Smoker   . Smokeless tobacco: Never Used  . Alcohol Use: No      Family History  No history of breast cancer or colon cancer in the family  Prior to Admission medications   Medication Sig Start Date End Date Taking? Authorizing Provider  amLODipine (NORVASC) 10 MG tablet Take 1 tablet (10 mg total) by mouth daily. 07/08/13   Leroy Sea, MD  atorvastatin (LIPITOR) 40 MG tablet Take 1 tablet (40 mg total) by mouth daily. 07/08/13   Leroy Sea, MD  carvedilol (COREG) 12.5 MG tablet Take 1 tablet (12.5 mg total) by mouth 2 (two) times daily with a meal. 07/08/13   Leroy Sea, MD  cephALEXin (KEFLEX) 500 MG capsule Take 1 capsule (500 mg total) by mouth 3 (three) times daily. 07/01/13   Leona Singleton, MD  clopidogrel (PLAVIX) 75 MG tablet Take 1 tablet (75 mg total) by mouth daily. 07/08/13   Leroy Sea, MD  FLUoxetine (PROZAC) 20 MG capsule Take 1 capsule (20 mg total) by mouth daily. 07/08/13   Leroy Sea, MD  fluticasone (FLONASE) 50 MCG/ACT nasal spray Place 2 sprays into the nose daily. 07/08/13   Leroy Sea, MD  hydrALAZINE (APRESOLINE) 50 MG tablet Take 1 tablet (50 mg total) by mouth 3 (three) times daily. 07/08/13   Leroy Sea, MD  levothyroxine (SYNTHROID, LEVOTHROID) 25 MCG tablet Take  1 tablet (25 mcg total) by mouth daily before breakfast. 07/08/13   Leroy Sea, MD  nitroGLYCERIN (NITROSTAT) 0.4 MG SL tablet Place 1 tablet (0.4 mg total) under the tongue every 5 (five) minutes as needed for chest pain. 07/08/13   Leroy Sea, MD  OVER THE COUNTER MEDICATION Take 1 capsule by mouth daily. "Dolo Neuro" cap    Historical Provider, MD  pantoprazole (PROTONIX) 40 MG tablet Take 1 tablet (40 mg total) by mouth daily. Any PPI OK 07/08/13   Leroy Sea, MD    No Known Allergies  Physical Exam  Vitals  Blood pressure 144/73, pulse 77, temperature 98.5 F (36.9 C), resp. rate 14, weight 152 lb 9.6 oz  (69.219 kg), SpO2 100.00%.   1. General  Elderly Hispanic female sitting on clinic examination table in no apparent distress,     2. Normal affect and insight, Not Suicidal or Homicidal, Awake Alert, Oriented X 3.  3. No F.N deficits, ALL C.Nerves Intact, Strength 5/5 all 4 extremities, Sensation intact all 4 extremities, Plantars down going.  4. Ears and Eyes appear Normal, Conjunctivae clear, PERRLA. Moist Oral Mucosa.  5. Supple Neck, No JVD, No cervical lymphadenopathy appriciated, No Carotid Bruits.  6. Symmetrical Chest wall movement, Good air movement bilaterally, CTAB.  7. RRR, No Gallops, Rubs or Murmurs, No Parasternal Heave.  8. Positive Bowel Sounds, Abdomen Soft, Non tender, No organomegaly appriciated,No rebound -guarding or rigidity.  9.  No Cyanosis, Normal Skin Turgor, No Skin Rash or Bruise.  10. Good muscle tone,  joints appear normal , no effusions, Normal ROM.  11. No Palpable Lymph Nodes in Neck or Axillae     Data Review  Lab Results  Component Value Date   WBC 5.2 06/29/2013   HGB 10.7* 06/29/2013   HCT 32.6* 06/29/2013   MCV 90.6 06/29/2013   PLT 250 06/29/2013      Chemistry      Component Value Date/Time   NA 138 06/29/2013 0536   K 4.8 06/29/2013 0536   CL 109 06/29/2013 0536   CO2 19 06/29/2013 0536   BUN 42* 06/29/2013 0536   CREATININE 1.31* 06/29/2013 0536      Component Value Date/Time   CALCIUM 8.9 06/29/2013 0536   ALKPHOS 75 06/28/2013 1820   AST 18 06/28/2013 1820   ALT 14 06/28/2013 1820   BILITOT 0.3 06/28/2013 1820       Lab Results  Component Value Date   HGBA1C 6.0* 06/29/2013    Lab Results  Component Value Date   CHOL 183 06/29/2013   HDL 37* 06/29/2013   LDLCALC 106* 06/29/2013   TRIG 202* 06/29/2013   CHOLHDL 4.9 06/29/2013    Lab Results  Component Value Date   TSH 2.174 06/29/2013    No results found for this basename: PSA         Assessment and plan  1. TIA. Recent admission to the  hospital. TIA workup was stable including MRI MRA of the brain, echo gram, carotid ultrasound, she's been continued on Plavix and statin for secondary prevention.    2. Hypertension stable. Blood pressure medications have been adjusted as above.    3. Acute renal failure. Patient's creatinine in January was noted to be around 1, however creatinine now has trended up to 1.7, she had been started on accommodation of diuretic and ARB, I think [facility] he has been cause due to this, a pending medications have been stopped she has been  placed on hydralazine instead. We will repeat BMP in one month's time.    4. Hypothyroidism on Synthroid will check TSH. Has some history of feeling weak.    5. Generalized weakness. Check TSH, check CBC as she has been on Plavix rule out occult blood loss.    6. Dyslipidemia continue statin    7. Incidental finding of thyroid and pulmonary nodule on chest CT scan in January of 2013. We will repeat another CT scan in January 2014. However no such  recommendation was made by the radiologist on the CT scan previously.     Routine health maintenance.  OB referral made for mammogram. She has had hysterectomy in the past.  GI referral made for surveillance colonoscopy.          Leroy Sea M.D on 07/08/2013 at 5:07 PM

## 2013-07-12 NOTE — Telephone Encounter (Signed)
OK thank you!  Caspar Favila T Nataliee Shurtz, MD  

## 2013-07-14 ENCOUNTER — Ambulatory Visit (HOSPITAL_COMMUNITY): Payer: No Typology Code available for payment source | Attending: Cardiology | Admitting: Radiology

## 2013-07-14 VITALS — BP 127/69 | HR 82 | Ht 61.0 in | Wt 150.0 lb

## 2013-07-14 DIAGNOSIS — R002 Palpitations: Secondary | ICD-10-CM | POA: Insufficient documentation

## 2013-07-14 DIAGNOSIS — E785 Hyperlipidemia, unspecified: Secondary | ICD-10-CM | POA: Insufficient documentation

## 2013-07-14 DIAGNOSIS — R000 Tachycardia, unspecified: Secondary | ICD-10-CM | POA: Insufficient documentation

## 2013-07-14 DIAGNOSIS — Z8673 Personal history of transient ischemic attack (TIA), and cerebral infarction without residual deficits: Secondary | ICD-10-CM | POA: Insufficient documentation

## 2013-07-14 DIAGNOSIS — I2510 Atherosclerotic heart disease of native coronary artery without angina pectoris: Secondary | ICD-10-CM | POA: Insufficient documentation

## 2013-07-14 DIAGNOSIS — R079 Chest pain, unspecified: Secondary | ICD-10-CM

## 2013-07-14 DIAGNOSIS — I10 Essential (primary) hypertension: Secondary | ICD-10-CM | POA: Insufficient documentation

## 2013-07-14 DIAGNOSIS — I779 Disorder of arteries and arterioles, unspecified: Secondary | ICD-10-CM | POA: Insufficient documentation

## 2013-07-14 DIAGNOSIS — R0602 Shortness of breath: Secondary | ICD-10-CM

## 2013-07-14 MED ORDER — TECHNETIUM TC 99M SESTAMIBI GENERIC - CARDIOLITE
10.0000 | Freq: Once | INTRAVENOUS | Status: AC | PRN
  Administered 2013-07-14: 10 via INTRAVENOUS

## 2013-07-14 MED ORDER — TECHNETIUM TC 99M SESTAMIBI GENERIC - CARDIOLITE
30.0000 | Freq: Once | INTRAVENOUS | Status: AC | PRN
  Administered 2013-07-14: 30 via INTRAVENOUS

## 2013-07-14 MED ORDER — REGADENOSON 0.4 MG/5ML IV SOLN
0.4000 mg | Freq: Once | INTRAVENOUS | Status: AC
  Administered 2013-07-14: 0.4 mg via INTRAVENOUS

## 2013-07-14 NOTE — Progress Notes (Signed)
[facility] SITE 3 NUCLEAR MED [address] [place], [place] [address] [phone]    Cardiology Nuclear Med Study  Wendy Petty is a 72 y.o. female     MRN : [id number]     DOB: [birth year]  Procedure Date: 07/14/2013  Nuclear Med Background Indication for Stress Test:  Evaluation for Ischemia and Post Hospital  (10/14 TIA symptoms, CP, negative enzymes) History:  CAD, Cath 2013 N/O CAD, Echo 2009 EF 55%, Stress Echo 2013 EF 55%, MPI 2010 (normal) EF 66% Cardiac Risk Factors: Carotid Disease, CVA, Hypertension and Lipids  Symptoms:  Chest Pain, DOE, Palpitations and Rapid HR   Nuclear Pre-Procedure Caffeine/Decaff Intake:  None NPO After: 7:00pm   Lungs:  clear O2 Sat: 98% on room air. IV 0.9% NS with Angio Cath:  22g  IV Site: R Hand  IV Started by:  Bonnita Levan, RN  Chest Size (in):  34 Cup Size: D  Height: 5\' 1"  (1.549 m)  Weight:  150 lb (68.04 kg)  BMI:  Body mass index is 28.36 kg/(m^2). Tech Comments:  Coreg held x 24 hours    Nuclear Med Study 1 or 2 day study: 1 day  Stress Test Type:  Stress  Reading MD: Cassell Clement, MD  Order Authorizing Provider:  Charlton Haws, MD  Resting Radionuclide: Technetium 46m Sestamibi  Resting Radionuclide Dose: 11.0 mCi   Stress Radionuclide:  Technetium 7m Sestamibi  Stress Radionuclide Dose: 33.0 mCi           Stress Protocol Rest HR: 82 Stress HR: 114  Rest BP: 127/69 Stress BP: 154/64  Exercise Time (min): n/a METS: n/a   Predicted Max HR: 149 bpm % Max HR: 76.51 bpm Rate Pressure Product: 21308   Dose of Adenosine (mg):  n/a Dose of Lexiscan: 0.4 mg  Dose of Atropine (mg): n/a Dose of Dobutamine: n/a mcg/kg/min (at max HR)  Stress Test Technologist: Nelson Chimes, BS-ES  Nuclear Technologist:  Dario Guardian, CNMT     Rest Procedure:  Myocardial perfusion imaging was performed at rest 45 minutes following the intravenous administration of Technetium 49m Sestamibi. Rest ECG: NSR - Normal  EKG  Stress Procedure:  The patient received IV Lexiscan 0.4 mg over 15-seconds.  Technetium 72m Sestamibi injected at 30-seconds.  Quantitative spect images were obtained after a 45 minute delay.  During the infusion of Lexiscan, patient complained of a heavy feeling all over and tiredness.  Symptoms began to resolve in recovery. Stress ECG: No significant ST segment change suggestive of ischemia.  QPS Raw Data Images:  Normal; no motion artifact; normal heart/lung ratio. Stress Images:  Normal homogeneous uptake in all areas of the myocardium. Rest Images:  Normal homogeneous uptake in all areas of the myocardium. Subtraction (SDS):  No evidence of ischemia. Transient Ischemic Dilatation (Normal <1.22):  1.16 Lung/Heart Ratio (Normal <0.45):  0.32  Quantitative Gated Spect Images QGS EDV:  72 ml QGS ESV:  23 ml  Impression Exercise Capacity:  Lexiscan with no exercise. BP Response:  Normal blood pressure response. Clinical Symptoms:  No significant symptoms noted. ECG Impression:  No significant ST segment change suggestive of ischemia. Comparison with Prior Nuclear Study: No images to compare  Overall Impression:  Normal stress nuclear study.  LV Ejection Fraction: 68%.  LV Wall Motion:  NL LV Function; NL Wall Motion  [employer]

## 2013-07-15 ENCOUNTER — Encounter: Payer: Self-pay | Admitting: Physician Assistant

## 2013-07-22 ENCOUNTER — Encounter: Payer: Self-pay | Admitting: Obstetrics & Gynecology

## 2013-07-29 ENCOUNTER — Ambulatory Visit: Payer: No Typology Code available for payment source

## 2013-07-29 ENCOUNTER — Other Ambulatory Visit: Payer: Self-pay

## 2013-07-29 ENCOUNTER — Encounter: Payer: Self-pay | Admitting: Emergency Medicine

## 2013-07-29 MED ORDER — HYDRALAZINE HCL 50 MG PO TABS: 50.0000 mg | ORAL_TABLET | Freq: Three times a day (TID) | ORAL | Status: DC

## 2013-07-29 MED ORDER — HYDROCHLOROTHIAZIDE 25 MG PO TABS: 25.0000 mg | ORAL_TABLET | Freq: Every day | ORAL | Status: DC

## 2013-07-29 NOTE — Progress Notes (Signed)
  Subjective:    Patient ID: Wendy Petty, female    DOB: 11/14/1940, 72 y.o.   MRN: [id number]  HPI    Review of Systems     Objective:   Physical Exam        Assessment & Plan:  Pt here with c/o 2 week ankle swelling with pain. Denies sob or dizziness Pt taking Hydralazine 10/30 started Dr. Hyman Hopes in room

## 2013-07-29 NOTE — Patient Instructions (Signed)
Pt instructed to hold from taking hydralazine and HCTZ 25 mg daily ordered. Pt to return in 2 weeks for bp check

## 2013-08-04 ENCOUNTER — Encounter (INDEPENDENT_AMBULATORY_CARE_PROVIDER_SITE_OTHER): Payer: Self-pay

## 2013-08-04 ENCOUNTER — Encounter: Payer: Self-pay | Admitting: Cardiovascular Disease

## 2013-08-04 ENCOUNTER — Ambulatory Visit (INDEPENDENT_AMBULATORY_CARE_PROVIDER_SITE_OTHER): Payer: No Typology Code available for payment source | Admitting: Cardiovascular Disease

## 2013-08-04 VITALS — BP 150/80 | HR 80 | Ht 62.0 in | Wt 152.0 lb

## 2013-08-04 DIAGNOSIS — I10 Essential (primary) hypertension: Secondary | ICD-10-CM

## 2013-08-04 DIAGNOSIS — F329 Major depressive disorder, single episode, unspecified: Secondary | ICD-10-CM

## 2013-08-04 DIAGNOSIS — E785 Hyperlipidemia, unspecified: Secondary | ICD-10-CM

## 2013-08-04 DIAGNOSIS — Z8673 Personal history of transient ischemic attack (TIA), and cerebral infarction without residual deficits: Secondary | ICD-10-CM

## 2013-08-04 DIAGNOSIS — R079 Chest pain, unspecified: Secondary | ICD-10-CM

## 2013-08-04 MED ORDER — AMLODIPINE BESYLATE 10 MG PO TABS: 10.0000 mg | ORAL_TABLET | Freq: Every day | ORAL | Status: DC

## 2013-08-04 NOTE — Assessment & Plan Note (Signed)
Cholesterol is at goal.  Continue current dose of statin and diet Rx.  No myalgias or side effects.  F/U  LFT's in 6 months. Lab Results  Component Value Date   LDLCALC 106* 06/29/2013

## 2013-08-04 NOTE — Progress Notes (Signed)
Patient ID: Wendy Petty, female   DOB: [birth year], 72 y.o.   MRN: [id number] Wendy Petty is a 72 y.o. [place] female who returns for history of chest pain and CAD  She is a history of CAD treated medically, HTN, HL, hypothyroidism, prior TIA, depression. Echo (09/16/07): EF 55%, mild LVH. Myoview (8/10): Normal study, EF 66%. ETT-echo (1/13): EF 55%, inferior HK with stress (ischemia could not be ruled out). LHC (09/12/11): Mid LAD 40, ostial D2 90 (small caliber), distal D3 20-30, distal RCA 40-50, mid PDA 30, EF 55-60%. Medical therapy recommended. Last seen in this office 01/2013.  She is here with the interpreter. She notes exertional CP for the last 6 mos. She was in [place] in 09/2012 and describes an episode of transient L arm weakness (? TIA). She states her BP was high at the time. Her symptoms resolved. She notes CP with mod exertion. She describes CCS Class III symptoms. No radiation to her jaw or arm or assoc diaphoresis. She does note assoc dyspnea and nausea. She feels tired. She takes NTG with relief. She also notes pain at rest and with lying flat. She has some relation to meals. She notes belching and dysphagia. She denies syncope. She sleeps on several pillows. This is chronic. No PND. She has some mild edema (L>R). Overall, she states her that her CP is improved over the last 6 mos.   F/U myovue 07/14/13 normal EF 68%    History with intepretor  Some LE edema  Antidepressant seems to be helping mood Has orange card and sees primary at [facility]   ROS: Denies fever, malais, weight loss, blurry vision, decreased visual acuity, cough, sputum, SOB, hemoptysis, pleuritic pain, palpitaitons, heartburn, abdominal pain, melena, lower extremity edema, claudication, or rash.  All other systems reviewed and negative  General: Affect appropriate Elderly honduran female HEENT: normal Neck supple with no adenopathy JVP normal no bruits no thyromegaly Lungs clear with no wheezing and good  diaphragmatic motion Heart:  S1/S2 no murmur, no rub, gallop or click PMI normal Abdomen: benighn, BS positve, no tenderness, no AAA no bruit.  No HSM or HJR Distal pulses intact with no bruits No edema Neuro non-focal Skin warm and dry No muscular weakness   Current Outpatient Prescriptions  Medication Sig Dispense Refill  . amLODipine (NORVASC) 10 MG tablet Take 1 tablet (10 mg total) by mouth daily.  30 tablet  3  . atorvastatin (LIPITOR) 40 MG tablet Take 1 tablet (40 mg total) by mouth daily.  30 tablet  3  . carvedilol (COREG) 12.5 MG tablet Take 1 tablet (12.5 mg total) by mouth 2 (two) times daily with a meal.  60 tablet  3  . clopidogrel (PLAVIX) 75 MG tablet Take 1 tablet (75 mg total) by mouth daily.  30 tablet  3  . FLUoxetine (PROZAC) 20 MG capsule Take 1 capsule (20 mg total) by mouth daily.  30 capsule  3  . fluticasone (FLONASE) 50 MCG/ACT nasal spray Place 2 sprays into the nose daily.  16 g  6  . hydrochlorothiazide (HYDRODIURIL) 25 MG tablet Take 1 tablet (25 mg total) by mouth daily.  90 tablet  3  . levothyroxine (SYNTHROID, LEVOTHROID) 25 MCG tablet Take 1 tablet (25 mcg total) by mouth daily before breakfast.  30 tablet  3  . nitroGLYCERIN (NITROSTAT) 0.4 MG SL tablet Place 1 tablet (0.4 mg total) under the tongue every 5 (five) minutes as needed for chest pain.  15 tablet  2  . omeprazole (PRILOSEC) 20 MG capsule Take 20 mg by mouth daily.      . [DISCONTINUED] cetirizine (ZYRTEC) 10 MG tablet Take 10 mg by mouth daily.        . [DISCONTINUED] losartan-hydrochlorothiazide (HYZAAR) 100-25 MG per tablet Take 1 tablet by mouth daily.         No current facility-administered medications for this visit.    Allergies  Review of patient's allergies indicates no known allergies.  Electrocardiogram:  10/14  SR rate 74 normal   Assessment and Plan

## 2013-08-04 NOTE — Patient Instructions (Signed)
The current medical regimen is effective;  continue present plan and medications.  Follow up in 6 months with Dr Nishan.  You will receive a letter in the mail 2 months before you are due.  Please call us when you receive this letter to schedule your follow up appointment.   

## 2013-08-04 NOTE — Assessment & Plan Note (Signed)
Well controlled.  Continue current medications and low sodium Dash type diet.    Norvasc refilled

## 2013-08-04 NOTE — Assessment & Plan Note (Signed)
Continue current meds  F/U primary

## 2013-08-04 NOTE — Assessment & Plan Note (Signed)
Stable with no angina and good activity level.  Continue medical Rx Normla myovue with no ischemia 07/14/13

## 2013-08-04 NOTE — Assessment & Plan Note (Signed)
No carotid disease Continue ASA and Plavix per neuro

## 2014-02-16 ENCOUNTER — Ambulatory Visit: Payer: No Typology Code available for payment source

## 2014-02-17 ENCOUNTER — Ambulatory Visit: Payer: No Typology Code available for payment source | Attending: Internal Medicine

## 2014-02-17 ENCOUNTER — Other Ambulatory Visit: Payer: Self-pay | Admitting: Internal Medicine

## 2014-04-28 ENCOUNTER — Ambulatory Visit (INDEPENDENT_AMBULATORY_CARE_PROVIDER_SITE_OTHER): Payer: No Typology Code available for payment source | Admitting: Cardiovascular Disease

## 2014-04-28 VITALS — BP 148/78 | HR 83 | Ht 65.0 in | Wt 150.0 lb

## 2014-04-28 DIAGNOSIS — E119 Type 2 diabetes mellitus without complications: Secondary | ICD-10-CM

## 2014-04-28 DIAGNOSIS — T148XXA Other injury of unspecified body region, initial encounter: Secondary | ICD-10-CM

## 2014-04-28 DIAGNOSIS — I10 Essential (primary) hypertension: Secondary | ICD-10-CM

## 2014-04-28 DIAGNOSIS — Z79899 Other long term (current) drug therapy: Secondary | ICD-10-CM

## 2014-04-28 DIAGNOSIS — I2510 Atherosclerotic heart disease of native coronary artery without angina pectoris: Secondary | ICD-10-CM

## 2014-04-28 DIAGNOSIS — E785 Hyperlipidemia, unspecified: Secondary | ICD-10-CM

## 2014-04-28 LAB — CBC
HCT: 38.2 % (ref 36.0–46.0)
Hemoglobin: 12.6 g/dL (ref 12.0–15.0)
MCHC: 33 g/dL (ref 30.0–36.0)
MCV: 87.2 fl (ref 78.0–100.0)
PLATELETS: 283 10*3/uL (ref 150.0–400.0)
RBC: 4.38 Mil/uL (ref 3.87–5.11)
RDW: 13.8 % (ref 11.5–15.5)
WBC: 6 10*3/uL (ref 4.0–10.5)

## 2014-04-28 LAB — HEPATIC FUNCTION PANEL
ALT: 12 U/L (ref 0–35)
AST: 18 U/L (ref 0–37)
Albumin: 3.7 g/dL (ref 3.5–5.2)
Alkaline Phosphatase: 67 U/L (ref 39–117)
BILIRUBIN DIRECT: 0 mg/dL (ref 0.0–0.3)
BILIRUBIN TOTAL: 0.4 mg/dL (ref 0.2–1.2)
Total Protein: 7.2 g/dL (ref 6.0–8.3)

## 2014-04-28 LAB — HEMOGLOBIN A1C: Hgb A1c MFr Bld: 6.5 % (ref 4.6–6.5)

## 2014-04-28 LAB — BASIC METABOLIC PANEL
BUN: 35 mg/dL — ABNORMAL HIGH (ref 6–23)
CHLORIDE: 101 meq/L (ref 96–112)
CO2: 30 mEq/L (ref 19–32)
Calcium: 9.1 mg/dL (ref 8.4–10.5)
Creatinine, Ser: 1.1 mg/dL (ref 0.4–1.2)
GFR: 50.81 mL/min — ABNORMAL LOW (ref >60.00)
Glucose, Bld: 97 mg/dL (ref 70–99)
POTASSIUM: 3 meq/L — AB (ref 3.5–5.1)
Sodium: 138 mEq/L (ref 135–145)

## 2014-04-28 MED ORDER — ATORVASTATIN CALCIUM 40 MG PO TABS: 40.0000 mg | ORAL_TABLET | Freq: Every day | ORAL | Status: DC

## 2014-04-28 MED ORDER — CLOPIDOGREL BISULFATE 75 MG PO TABS
75.0000 mg | ORAL_TABLET | Freq: Every day | ORAL | Status: DC
Start: 2014-04-28 — End: 2015-09-26

## 2014-04-28 NOTE — Assessment & Plan Note (Signed)
Cholesterol is at goal.  Continue current dose of statin and diet Rx.  No myalgias or side effects.  F/U  LFT's in 6 months. Lab Results  Component Value Date   LDLCALC 106* 06/29/2013  Check liver today

## 2014-04-28 NOTE — Assessment & Plan Note (Signed)
Well controlled.  Continue current medications and low sodium Dash type diet.    

## 2014-04-28 NOTE — Assessment & Plan Note (Signed)
Stable with no angina and good activity level.  Continue medical Rx  

## 2014-04-28 NOTE — Progress Notes (Signed)
Patient ID: Wendy Petty, female   DOB: 1940-09-27, 73 y.o.   MRN: [id number] Wendy Petty is a 74 y.o. [name] female who returns for history of chest pain and CAD  She is a history of CAD treated medically, HTN, HL, hypothyroidism, prior TIA, depression. Echo (09/16/07): EF 55%, mild LVH. Myoview (8/10): Normal study, EF 66%. ETT-echo (1/13): EF 55%, inferior HK with stress (ischemia could not be ruled out). LHC (09/12/11): Mid LAD 40, ostial D2 90 (small caliber), distal D3 20-30, distal RCA 40-50, mid PDA 30, EF 55-60%. Medical therapy recommended.   F/U myovue 07/14/13 normal EF 68%   History with intepretor Some LE edema Antidepressant seems to be helping mood Has orange card and sees primary at [facility]  Needs refill on plavix and lipitor    ROS: Denies fever, malais, weight loss, blurry vision, decreased visual acuity, cough, sputum, SOB, hemoptysis, pleuritic pain, palpitaitons, heartburn, abdominal pain, melena, lower extremity edema, claudication, or rash.  All other systems reviewed and negative  General: Affect appropriate Healthy:  appears stated age 53: normal Neck supple with no adenopathy JVP normal no bruits no thyromegaly Lungs clear with no wheezing and good diaphragmatic motion Heart:  S1/S2 no murmur, no rub, gallop or click PMI normal Abdomen: benighn, BS positve, no tenderness, no AAA no bruit.  No HSM or HJR Distal pulses intact with no bruits No edema Neuro non-focal Skin warm and dry mild brusing  No muscular weakness   Current Outpatient Prescriptions  Medication Sig Dispense Refill  . amLODipine (NORVASC) 10 MG tablet Take 1 tablet (10 mg total) by mouth daily.  30 tablet  11  . atorvastatin (LIPITOR) 40 MG tablet Take 1 tablet (40 mg total) by mouth daily.  30 tablet  3  . carvedilol (COREG) 12.5 MG tablet Take 1 tablet (12.5 mg total) by mouth 2 (two) times daily with a meal.  60 tablet  3  . clopidogrel (PLAVIX) 75 MG tablet Take 1 tablet (75 mg  total) by mouth daily.  30 tablet  3  . FLUoxetine (PROZAC) 20 MG capsule Take 1 capsule (20 mg total) by mouth daily.  30 capsule  3  . fluticasone (FLONASE) 50 MCG/ACT nasal spray Place 2 sprays into the nose daily.  16 g  6  . hydrochlorothiazide (HYDRODIURIL) 25 MG tablet Take 1 tablet (25 mg total) by mouth daily.  90 tablet  3  . nitroGLYCERIN (NITROSTAT) 0.4 MG SL tablet Place 1 tablet (0.4 mg total) under the tongue every 5 (five) minutes as needed for chest pain.  15 tablet  2  . omeprazole (PRILOSEC) 20 MG capsule Take 20 mg by mouth daily.      . pseudoephedrine-acetaminophen (TYLENOL SINUS) 30-500 MG TABS Take 1 tablet by mouth every 4 (four) hours as needed.      Marland Kitchen SYNTHROID 25 MCG tablet TAKE 1 TABLET BY MOUTH ONCE DAILY  120 tablet  0  . [DISCONTINUED] cetirizine (ZYRTEC) 10 MG tablet Take 10 mg by mouth daily.        . [DISCONTINUED] losartan-hydrochlorothiazide (HYZAAR) 100-25 MG per tablet Take 1 tablet by mouth daily.         No current facility-administered medications for this visit.    Allergies  Review of patient's allergies indicates no known allergies.  Electrocardiogram:  NSR  Normal ECG   Assessment and Plan

## 2014-04-28 NOTE — Patient Instructions (Signed)
The current medical regimen is effective;  continue present plan and medications.  Please have blood work today (CBC,PLT,BMP, LFT AND HA1C)  Follow up in 6 months with [name].  You will receive a letter in the mail 2 months before you are due.  Please call us when you receive this letter to schedule your follow up appointment.

## 2014-05-12 ENCOUNTER — Other Ambulatory Visit: Payer: Self-pay | Admitting: *Deleted

## 2014-05-12 DIAGNOSIS — E876 Hypokalemia: Secondary | ICD-10-CM

## 2014-05-12 MED ORDER — POTASSIUM CHLORIDE CRYS ER 20 MEQ PO TBCR: EXTENDED_RELEASE_TABLET | ORAL | Status: DC

## 2014-05-19 ENCOUNTER — Other Ambulatory Visit (INDEPENDENT_AMBULATORY_CARE_PROVIDER_SITE_OTHER): Payer: No Typology Code available for payment source

## 2014-05-19 DIAGNOSIS — E876 Hypokalemia: Secondary | ICD-10-CM

## 2014-05-19 LAB — BASIC METABOLIC PANEL
BUN: 23 mg/dL (ref 6–23)
CO2: 25 meq/L (ref 19–32)
CREATININE: 1.2 mg/dL (ref 0.4–1.2)
Calcium: 8.9 mg/dL (ref 8.4–10.5)
Chloride: 104 mEq/L (ref 96–112)
GFR: 49.27 mL/min — AB (ref >60.00)
GLUCOSE: 112 mg/dL — AB (ref 70–99)
Potassium: 3.4 mEq/L — ABNORMAL LOW (ref 3.5–5.1)
Sodium: 138 mEq/L (ref 135–145)

## 2014-05-25 ENCOUNTER — Other Ambulatory Visit: Payer: Self-pay | Admitting: Internal Medicine

## 2014-05-27 ENCOUNTER — Other Ambulatory Visit: Payer: Self-pay | Admitting: Internal Medicine

## 2014-06-08 ENCOUNTER — Other Ambulatory Visit: Payer: Self-pay

## 2014-06-28 ENCOUNTER — Telehealth: Payer: Self-pay | Admitting: Internal Medicine

## 2014-06-28 ENCOUNTER — Telehealth: Payer: Self-pay | Admitting: Family Medicine

## 2014-06-28 ENCOUNTER — Other Ambulatory Visit: Payer: Self-pay | Admitting: Family Medicine

## 2014-06-28 ENCOUNTER — Ambulatory Visit: Payer: No Typology Code available for payment source | Admitting: Family Medicine

## 2014-06-28 NOTE — Telephone Encounter (Signed)
Patient's daughter requests help since Patient missed her appointment because she didn't have transportation. Patient does not have any medication and her legs are swelling because her high blood pressure. Please follow up with Patient, she is waiting at the [facility].

## 2014-06-28 NOTE — Telephone Encounter (Signed)
Patient missed her appointment because she didn't have transportation. She requested a new appointment, unfortunately there is nothing available for the next five days. Please follow up with Patient who is waiting at the [facility].

## 2014-07-06 ENCOUNTER — Encounter: Payer: Self-pay | Admitting: Internal Medicine

## 2014-07-06 ENCOUNTER — Ambulatory Visit: Payer: No Typology Code available for payment source | Attending: Internal Medicine | Admitting: Internal Medicine

## 2014-07-06 VITALS — BP 137/76 | HR 71 | Temp 98.4°F | Resp 16 | Wt 154.0 lb

## 2014-07-06 DIAGNOSIS — E039 Hypothyroidism, unspecified: Secondary | ICD-10-CM | POA: Insufficient documentation

## 2014-07-06 DIAGNOSIS — Z23 Encounter for immunization: Secondary | ICD-10-CM | POA: Insufficient documentation

## 2014-07-06 DIAGNOSIS — E785 Hyperlipidemia, unspecified: Secondary | ICD-10-CM | POA: Insufficient documentation

## 2014-07-06 DIAGNOSIS — E038 Other specified hypothyroidism: Secondary | ICD-10-CM

## 2014-07-06 DIAGNOSIS — I2510 Atherosclerotic heart disease of native coronary artery without angina pectoris: Secondary | ICD-10-CM | POA: Insufficient documentation

## 2014-07-06 DIAGNOSIS — K219 Gastro-esophageal reflux disease without esophagitis: Secondary | ICD-10-CM | POA: Insufficient documentation

## 2014-07-06 DIAGNOSIS — R609 Edema, unspecified: Secondary | ICD-10-CM

## 2014-07-06 DIAGNOSIS — R600 Localized edema: Secondary | ICD-10-CM | POA: Insufficient documentation

## 2014-07-06 DIAGNOSIS — Z8673 Personal history of transient ischemic attack (TIA), and cerebral infarction without residual deficits: Secondary | ICD-10-CM | POA: Insufficient documentation

## 2014-07-06 DIAGNOSIS — R42 Dizziness and giddiness: Secondary | ICD-10-CM | POA: Insufficient documentation

## 2014-07-06 DIAGNOSIS — M62830 Muscle spasm of back: Secondary | ICD-10-CM

## 2014-07-06 DIAGNOSIS — M62838 Other muscle spasm: Secondary | ICD-10-CM | POA: Insufficient documentation

## 2014-07-06 DIAGNOSIS — I10 Essential (primary) hypertension: Secondary | ICD-10-CM

## 2014-07-06 LAB — LIPID PANEL
Cholesterol: 128 mg/dL (ref 0–200)
HDL: 38 mg/dL — ABNORMAL LOW
LDL Cholesterol: 56 mg/dL (ref 0–99)
Total CHOL/HDL Ratio: 3.4 ratio
Triglycerides: 168 mg/dL — ABNORMAL HIGH
VLDL: 34 mg/dL (ref 0–40)

## 2014-07-06 LAB — COMPLETE METABOLIC PANEL WITHOUT GFR
ALT: 13 U/L (ref 0–35)
AST: 15 U/L (ref 0–37)
Albumin: 4 g/dL (ref 3.5–5.2)
Alkaline Phosphatase: 71 U/L (ref 39–117)
BUN: 15 mg/dL (ref 6–23)
CO2: 26 meq/L (ref 19–32)
Calcium: 9.1 mg/dL (ref 8.4–10.5)
Chloride: 106 meq/L (ref 96–112)
Creat: 0.91 mg/dL (ref 0.50–1.10)
GFR, Est African American: 73 mL/min
GFR, Est Non African American: 63 mL/min
Glucose, Bld: 94 mg/dL (ref 70–99)
Potassium: 4.2 meq/L (ref 3.5–5.3)
Sodium: 141 meq/L (ref 135–145)
Total Bilirubin: 0.5 mg/dL (ref 0.2–1.2)
Total Protein: 7.3 g/dL (ref 6.0–8.3)

## 2014-07-06 LAB — TSH: TSH: 2.258 u[IU]/mL (ref 0.350–4.500)

## 2014-07-06 MED ORDER — HYDROCHLOROTHIAZIDE 25 MG PO TABS: 25.0000 mg | ORAL_TABLET | Freq: Every day | ORAL | Status: DC

## 2014-07-06 MED ORDER — AMLODIPINE BESYLATE 5 MG PO TABS: 5.0000 mg | ORAL_TABLET | Freq: Every day | ORAL | Status: DC

## 2014-07-06 MED ORDER — CYCLOBENZAPRINE HCL 5 MG PO TABS: 5.0000 mg | ORAL_TABLET | Freq: Every day | ORAL | Status: DC

## 2014-07-06 MED ORDER — LEVOTHYROXINE SODIUM 25 MCG PO TABS: ORAL_TABLET | ORAL | Status: DC

## 2014-07-06 MED ORDER — CARVEDILOL 12.5 MG PO TABS: 12.5000 mg | ORAL_TABLET | Freq: Two times a day (BID) | ORAL | Status: DC

## 2014-07-06 NOTE — Progress Notes (Signed)
MRN: [id number] Name: Wendy Petty  Sex: female Age: 73 y.o. DOB: [birth year]  Allergies: Review of patient's allergies indicates no known allergies.  Chief Complaint  Patient presents with  . Follow-up    HPI: Patient is 73 y.o. female who has history of hypertension hyperlipidemia hypothyroidism CAD comes today for followup her she reported to have then out of her medication hydrochlorothiazide currently she is on Coreg and amlodipine 10 mg daily, as per patient she had some dizziness when she quickly gets up, today her blood pressure is 137/76 currently denies any dizziness, she reported to have some leg swelling denies any orthopnea or PND. Patient also reported to have upper back pain denies any fall or trauma. Past Medical History  Diagnosis Date  . Hypertension   . GERD (gastroesophageal reflux disease)   . HYPERLIPIDEMIA   . THYROID NODULE   . PULMONARY NODULE   . Edema   . CHEST PAIN UNSPECIFIED   . ANEMIA, CHRONIC   . Hx of cardiovascular stress test     Lexiscan Myoview (11/14):  No ischemia, EF 68% (normal study)    Past Surgical History  Procedure Laterality Date  . Vaginal hysterectomy  1989  . Tubal ligation        Medication List       This list is accurate as of: 07/06/14 12:51 PM.  Always use your most recent med list.               amLODipine 5 MG tablet  Commonly known as:  NORVASC  Take 1 tablet (5 mg total) by mouth daily.     atorvastatin 40 MG tablet  Commonly known as:  LIPITOR  Take 1 tablet (40 mg total) by mouth daily.     carvedilol 12.5 MG tablet  Commonly known as:  COREG  Take 1 tablet (12.5 mg total) by mouth 2 (two) times daily with a meal.     clopidogrel 75 MG tablet  Commonly known as:  PLAVIX  Take 1 tablet (75 mg total) by mouth daily.     cyclobenzaprine 5 MG tablet  Commonly known as:  FLEXERIL  Take 1 tablet (5 mg total) by mouth at bedtime.     FLUoxetine 20 MG capsule  Commonly known as:  PROZAC  Take  1 capsule (20 mg total) by mouth daily.     fluticasone 50 MCG/ACT nasal spray  Commonly known as:  FLONASE  Place 2 sprays into the nose daily.     hydrochlorothiazide 25 MG tablet  Commonly known as:  HYDRODIURIL  Take 1 tablet (25 mg total) by mouth daily.     levothyroxine 25 MCG tablet  Commonly known as:  SYNTHROID  TAKE 1 TABLET BY MOUTH ONCE DAILY     nitroGLYCERIN 0.4 MG SL tablet  Commonly known as:  NITROSTAT  Place 1 tablet (0.4 mg total) under the tongue every 5 (five) minutes as needed for chest pain.     omeprazole 20 MG capsule  Commonly known as:  PRILOSEC  Take 20 mg by mouth daily.     potassium chloride SA 20 MEQ tablet  Commonly known as:  K-DUR,KLOR-CON  40 MEQ  TODAY  THEN 20 MEQ  THEREAFTER .     pseudoephedrine-acetaminophen 30-500 MG Tabs  Commonly known as:  TYLENOL SINUS  Take 1 tablet by mouth every 4 (four) hours as needed.        Meds ordered this encounter  Medications  .  levothyroxine (SYNTHROID) 25 MCG tablet    Sig: TAKE 1 TABLET BY MOUTH ONCE DAILY    Dispense:  120 tablet    Refill:  0  . hydrochlorothiazide (HYDRODIURIL) 25 MG tablet    Sig: Take 1 tablet (25 mg total) by mouth daily.    Dispense:  90 tablet    Refill:  3    Take in the am  . carvedilol (COREG) 12.5 MG tablet    Sig: Take 1 tablet (12.5 mg total) by mouth 2 (two) times daily with a meal.    Dispense:  60 tablet    Refill:  3  . amLODipine (NORVASC) 5 MG tablet    Sig: Take 1 tablet (5 mg total) by mouth daily.    Dispense:  30 tablet    Refill:  3  . cyclobenzaprine (FLEXERIL) 5 MG tablet    Sig: Take 1 tablet (5 mg total) by mouth at bedtime.    Dispense:  30 tablet    Refill:  1    Immunization History  Administered Date(s) Administered  . Influenza,inj,Quad PF,36+ Mos 07/08/2013, 07/06/2014    History reviewed. No pertinent family history.  History  Substance Use Topics  . Smoking status: Never Smoker   . Smokeless tobacco: Never Used  .  Alcohol Use: No    Review of Systems   As noted in HPI  Filed Vitals:   07/06/14 1133  BP: 137/76  Pulse: 71  Temp: 98.4 F (36.9 C)  Resp: 16    Physical Exam  Physical Exam  Constitutional: No distress.  HENT:  Both TMs visualized not congested  Eyes: EOM are normal. Pupils are equal, round, and reactive to light.  Cardiovascular: Normal rate and regular rhythm.   Pulmonary/Chest: Breath sounds normal. No respiratory distress. She has no wheezes. She has no rales.  Musculoskeletal:  Upper back some paraspinal tenderness, bilateral shoulders good range of motion   1+ pedal edema    CBC    Component Value Date/Time   WBC 6.0 04/28/2014 1216   RBC 4.38 04/28/2014 1216   RBC 4.13* 09/14/2007 1810   HGB 12.6 04/28/2014 1216   HCT 38.2 04/28/2014 1216   PLT 283.0 04/28/2014 1216   MCV 87.2 04/28/2014 1216   LYMPHSABS 2.0 06/28/2013 1820   MONOABS 0.5 06/28/2013 1820   EOSABS 0.2 06/28/2013 1820   BASOSABS 0.0 06/28/2013 1820    CMP     Component Value Date/Time   NA 138 05/19/2014 1127   K 3.4* 05/19/2014 1127   CL 104 05/19/2014 1127   CO2 25 05/19/2014 1127   GLUCOSE 112* 05/19/2014 1127   BUN 23 05/19/2014 1127   CREATININE 1.2 05/19/2014 1127   CALCIUM 8.9 05/19/2014 1127   PROT 7.2 04/28/2014 1216   ALBUMIN 3.7 04/28/2014 1216   AST 18 04/28/2014 1216   ALT 12 04/28/2014 1216   ALKPHOS 67 04/28/2014 1216   BILITOT 0.4 04/28/2014 1216   GFRNONAA 40* 06/29/2013 0536   GFRAA 46* 06/29/2013 0536    Lab Results  Component Value Date/Time   CHOL 183 06/29/2013  5:36 AM    No components found with this basename: hga1c    Lab Results  Component Value Date/Time   AST 18 04/28/2014 12:16 PM    Assessment and Plan  Essential hypertension - Plan: Blood pressure is well controlled, since she reported to have then out of hydrochlorothiazide and noticed some leg swelling, I have resumed her back on hydrochlorthiazide and reduce  the dose of Norvasc to 5 mg. Will check  blood chemistry  COMPLETE METABOLIC PANEL WITH GFR, hydrochlorothiazide (HYDRODIURIL) 25 MG tablet, carvedilol (COREG) 12.5 MG tablet, amLODipine (NORVASC) 5 MG tablet  Other specified hypothyroidism - Plan: Will recheck her TSH level, she is on levothyroxine (SYNTHROID) 25 MCG tablet  History of TIA (transient ischemic attack) - Plan: Currently patient is on Plavix Lipid panel and Lipitor, will check  Back muscle spasm - Plan:  advise patient to apply heating pad, trial of cyclobenzaprine (FLEXERIL) 5 MG tablet each bedtime  Edema  advise patient for low salt diet, leg elevation, resume back on hydrochlorthiazide, reduced the dose of Norvasc.   Health Maintenance Flu shot given today.  Return in about 3 months (around 10/06/2014) for hypertension, hypothyroid, hyperipidemia.  Lorayne Marek, MD

## 2014-07-06 NOTE — Progress Notes (Signed)
Patient complains of some swelling to her left leg for the past month Patient also complains of some dizziness and pain in her back

## 2014-07-22 ENCOUNTER — Encounter: Payer: Self-pay | Admitting: Internal Medicine

## 2014-07-22 ENCOUNTER — Ambulatory Visit: Payer: No Typology Code available for payment source | Attending: Internal Medicine | Admitting: Internal Medicine

## 2014-07-22 VITALS — BP 156/81 | HR 79 | Temp 97.7°F | Resp 16 | Wt 154.0 lb

## 2014-07-22 DIAGNOSIS — E785 Hyperlipidemia, unspecified: Secondary | ICD-10-CM | POA: Insufficient documentation

## 2014-07-22 DIAGNOSIS — Z79899 Other long term (current) drug therapy: Secondary | ICD-10-CM | POA: Insufficient documentation

## 2014-07-22 DIAGNOSIS — K219 Gastro-esophageal reflux disease without esophagitis: Secondary | ICD-10-CM | POA: Insufficient documentation

## 2014-07-22 DIAGNOSIS — I10 Essential (primary) hypertension: Secondary | ICD-10-CM | POA: Insufficient documentation

## 2014-07-22 DIAGNOSIS — R42 Dizziness and giddiness: Secondary | ICD-10-CM | POA: Insufficient documentation

## 2014-07-22 DIAGNOSIS — E039 Hypothyroidism, unspecified: Secondary | ICD-10-CM | POA: Insufficient documentation

## 2014-07-22 DIAGNOSIS — E038 Other specified hypothyroidism: Secondary | ICD-10-CM

## 2014-07-22 DIAGNOSIS — Z7902 Long term (current) use of antithrombotics/antiplatelets: Secondary | ICD-10-CM | POA: Insufficient documentation

## 2014-07-22 MED ORDER — AMLODIPINE BESYLATE 5 MG PO TABS: 5.0000 mg | ORAL_TABLET | Freq: Every day | ORAL | Status: DC

## 2014-07-22 MED ORDER — HYDROCHLOROTHIAZIDE 25 MG PO TABS: 25.0000 mg | ORAL_TABLET | Freq: Every day | ORAL | Status: DC

## 2014-07-22 MED ORDER — CARVEDILOL 12.5 MG PO TABS: 12.5000 mg | ORAL_TABLET | Freq: Two times a day (BID) | ORAL | Status: DC

## 2014-07-22 MED ORDER — LEVOTHYROXINE SODIUM 25 MCG PO TABS: ORAL_TABLET | ORAL | Status: DC

## 2014-07-22 MED ORDER — OMEPRAZOLE 20 MG PO CPDR: 20.0000 mg | DELAYED_RELEASE_CAPSULE | Freq: Every day | ORAL | Status: DC

## 2014-07-22 MED ORDER — ATORVASTATIN CALCIUM 40 MG PO TABS: 40.0000 mg | ORAL_TABLET | Freq: Every day | ORAL | Status: DC

## 2014-07-22 MED ORDER — FLUTICASONE PROPIONATE 50 MCG/ACT NA SUSP: 2.0000 | Freq: Every day | NASAL | Status: DC

## 2014-07-22 MED ORDER — MECLIZINE HCL 25 MG PO TABS: 25.0000 mg | ORAL_TABLET | Freq: Three times a day (TID) | ORAL | Status: DC | PRN

## 2014-07-22 MED ORDER — POTASSIUM CHLORIDE CRYS ER 20 MEQ PO TBCR: EXTENDED_RELEASE_TABLET | ORAL | Status: DC

## 2014-07-22 NOTE — Progress Notes (Signed)
Patient complains of dizziness that has gotten worse Patient is going out of the country and requesting medication refills For three months

## 2014-07-22 NOTE — Progress Notes (Signed)
MRN: [id number] Name: Wendy Petty  Sex: female Age: 73 y.o. DOB: 18-Jul-1941  Allergies: Review of patient's allergies indicates no known allergies.  Chief Complaint  Patient presents with  . Dizziness    HPI: Patient is 73 y.o. female who history of hypertension hypothyroidism GERD CAD, patient reported to have symptoms of dizziness which as room spinning sensation at bedtime she feels nauseous as well, denies any chest pain or shortness of breath today she has not taken her blood pressure medication her blood pressure is borderline elevated, she's also having reflux symptoms and is requesting refill on her medications.  Past Medical History  Diagnosis Date  . Hypertension   . GERD (gastroesophageal reflux disease)   . HYPERLIPIDEMIA   . THYROID NODULE   . PULMONARY NODULE   . Edema   . CHEST PAIN UNSPECIFIED   . ANEMIA, CHRONIC   . Hx of cardiovascular stress test     Lexiscan Myoview (11/14):  No ischemia, EF 68% (normal study)    Past Surgical History  Procedure Laterality Date  . Vaginal hysterectomy  1989  . Tubal ligation        Medication List       This list is accurate as of: 07/22/14 10:52 AM.  Always use your most recent med list.               amLODipine 5 MG tablet  Commonly known as:  NORVASC  Take 1 tablet (5 mg total) by mouth daily.     atorvastatin 40 MG tablet  Commonly known as:  LIPITOR  Take 1 tablet (40 mg total) by mouth daily.     carvedilol 12.5 MG tablet  Commonly known as:  COREG  Take 1 tablet (12.5 mg total) by mouth 2 (two) times daily with a meal.     clopidogrel 75 MG tablet  Commonly known as:  PLAVIX  Take 1 tablet (75 mg total) by mouth daily.     cyclobenzaprine 5 MG tablet  Commonly known as:  FLEXERIL  Take 1 tablet (5 mg total) by mouth at bedtime.     FLUoxetine 20 MG capsule  Commonly known as:  PROZAC  Take 1 capsule (20 mg total) by mouth daily.     fluticasone 50 MCG/ACT nasal spray  Commonly  known as:  FLONASE  Place 2 sprays into the nose daily.     hydrochlorothiazide 25 MG tablet  Commonly known as:  HYDRODIURIL  Take 1 tablet (25 mg total) by mouth daily.     levothyroxine 25 MCG tablet  Commonly known as:  SYNTHROID  TAKE 1 TABLET BY MOUTH ONCE DAILY     meclizine 25 MG tablet  Commonly known as:  ANTIVERT  Take 1 tablet (25 mg total) by mouth 3 (three) times daily as needed for dizziness or nausea.     nitroGLYCERIN 0.4 MG SL tablet  Commonly known as:  NITROSTAT  Place 1 tablet (0.4 mg total) under the tongue every 5 (five) minutes as needed for chest pain.     omeprazole 20 MG capsule  Commonly known as:  PRILOSEC  Take 1 capsule (20 mg total) by mouth daily.     potassium chloride SA 20 MEQ tablet  Commonly known as:  K-DUR,KLOR-CON  .     pseudoephedrine-acetaminophen 30-500 MG Tabs  Commonly known as:  TYLENOL SINUS  Take 1 tablet by mouth every 4 (four) hours as needed.  Meds ordered this encounter  Medications  . amLODipine (NORVASC) 5 MG tablet    Sig: Take 1 tablet (5 mg total) by mouth daily.    Dispense:  90 tablet    Refill:  3  . atorvastatin (LIPITOR) 40 MG tablet    Sig: Take 1 tablet (40 mg total) by mouth daily.    Dispense:  90 tablet    Refill:  3  . hydrochlorothiazide (HYDRODIURIL) 25 MG tablet    Sig: Take 1 tablet (25 mg total) by mouth daily.    Dispense:  90 tablet    Refill:  3    Take in the am  . levothyroxine (SYNTHROID) 25 MCG tablet    Sig: TAKE 1 TABLET BY MOUTH ONCE DAILY    Dispense:  120 tablet    Refill:  0  . omeprazole (PRILOSEC) 20 MG capsule    Sig: Take 1 capsule (20 mg total) by mouth daily.    Dispense:  90 capsule    Refill:  2  . potassium chloride SA (K-DUR,KLOR-CON) 20 MEQ tablet    Sig: .    Dispense:  90 tablet    Refill:  11  . meclizine (ANTIVERT) 25 MG tablet    Sig: Take 1 tablet (25 mg total) by mouth 3 (three) times daily as needed for dizziness or nausea.    Dispense:  30  tablet    Refill:  1    Immunization History  Administered Date(s) Administered  . Influenza,inj,Quad PF,36+ Mos 07/08/2013, 07/06/2014    History reviewed. No pertinent family history.  History  Substance Use Topics  . Smoking status: Never Smoker   . Smokeless tobacco: Never Used  . Alcohol Use: No    Review of Systems   As noted in HPI  Filed Vitals:   07/22/14 1025  BP: 156/81  Pulse: 79  Temp: 97.7 F (36.5 C)  Resp: 16    Physical Exam  Physical Exam  Constitutional: No distress.  HENT:  Both TMs visualized not congested   Eyes: EOM are normal. Pupils are equal, round, and reactive to light.  No nystagmus  Neck: Neck supple.  Cardiovascular: Normal rate and regular rhythm.   Pulmonary/Chest: Breath sounds normal. No respiratory distress. She has no wheezes. She has no rales.  Abdominal: Soft. There is no tenderness. There is no rebound.    CBC    Component Value Date/Time   WBC 6.0 04/28/2014 1216   RBC 4.38 04/28/2014 1216   RBC 4.13* 09/14/2007 1810   HGB 12.6 04/28/2014 1216   HCT 38.2 04/28/2014 1216   PLT 283.0 04/28/2014 1216   MCV 87.2 04/28/2014 1216   LYMPHSABS 2.0 06/28/2013 1820   MONOABS 0.5 06/28/2013 1820   EOSABS 0.2 06/28/2013 1820   BASOSABS 0.0 06/28/2013 1820    CMP     Component Value Date/Time   NA 141 07/06/2014 1222   K 4.2 07/06/2014 1222   CL 106 07/06/2014 1222   CO2 26 07/06/2014 1222   GLUCOSE 94 07/06/2014 1222   BUN 15 07/06/2014 1222   CREATININE 0.91 07/06/2014 1222   CREATININE 1.2 05/19/2014 1127   CALCIUM 9.1 07/06/2014 1222   PROT 7.3 07/06/2014 1222   ALBUMIN 4.0 07/06/2014 1222   AST 15 07/06/2014 1222   ALT 13 07/06/2014 1222   ALKPHOS 71 07/06/2014 1222   BILITOT 0.5 07/06/2014 1222   GFRNONAA 63 07/06/2014 1222   GFRNONAA 40* 06/29/2013 0536   GFRAA 73 07/06/2014 1222  GFRAA 46* 06/29/2013 0536    Lab Results  Component Value Date/Time   CHOL 128 07/06/2014 12:22 PM    No  components found for: HGA1C  Lab Results  Component Value Date/Time   AST 15 07/06/2014 12:22 PM    Assessment and Plan  Essential hypertension - Plan: advised patient for compliance with the medication, refill doneamLODipine (NORVASC) 5 MG tablet, hydrochlorothiazide (HYDRODIURIL) 25 MG tablet  Other specified hypothyroidism - Plan: last TSH level was in normal range, continue withlevothyroxine (SYNTHROID) 25 MCG tablet  Dizziness and giddiness - Plan: trial of meclizine (ANTIVERT) 25 MG tablet  Hyperlipidemia - Plan: will repeat fasting lipid panel on next visit, continue with atorvastatin (LIPITOR) 40 MG tablet  Gastroesophageal reflux disease, esophagitis presence not specified - Plan: lifestyle modification, resume back on omeprazole (PRILOSEC) 20 MG capsule    Return in about 3 months (around 10/22/2014) for hypertension, hypothyroid, hyperipidemia.  Lorayne Marek, MD

## 2014-08-18 ENCOUNTER — Encounter (HOSPITAL_COMMUNITY): Payer: Self-pay | Admitting: Cardiology

## 2014-11-27 NOTE — Progress Notes (Signed)
Patient ID: Wendy Petty, female   DOB: 09-May-1941, 74 y.o.   MRN: [id number] Wendy Petty is a 74 y.o. . Wendy Petty female who returns for history of chest pain and CAD  She is a history of CAD treated medically, HTN, HL, hypothyroidism, prior TIA, depression. Echo (09/16/07): EF 55%, mild LVH. Myoview (8/10): Normal study, EF 66%. ETT-echo (1/13): EF 55%, inferior HK with stress (ischemia could not be ruled out). LHC (09/12/11): Mid LAD 40, ostial D2 90 (small caliber), distal D3 20-30, distal RCA 40-50, mid PDA 30, EF 55-60%. Medical therapy recommended.   F/U myovue 07/14/13 normal EF 68%   History with intepretor Some LE edema Antidepressant seems to be helping mood Has orange card and sees primary at [facility]  Some dizzyness Given trial of antivert    ROS: Denies fever, malais, weight loss, blurry vision, decreased visual acuity, cough, sputum, SOB, hemoptysis, pleuritic pain, palpitaitons, heartburn, abdominal pain, melena, lower extremity edema, claudication, or rash.  All other systems reviewed and negative  General: Affect appropriate Healthy:  appears stated age 66: normal Neck supple with no adenopathy JVP normal no bruits no thyromegaly Lungs clear with no wheezing and good diaphragmatic motion Heart:  S1/S2 no murmur, no rub, gallop or click PMI normal Abdomen: benighn, BS positve, no tenderness, no AAA no bruit.  No HSM or HJR Distal pulses intact with no bruits No edema Neuro non-focal Skin warm and dry mild brusing  No muscular weakness   Current Outpatient Prescriptions  Medication Sig Dispense Refill  . amLODipine (NORVASC) 5 MG tablet Take 1 tablet (5 mg total) by mouth daily. 90 tablet 3  . atorvastatin (LIPITOR) 40 MG tablet Take 1 tablet (40 mg total) by mouth daily. 90 tablet 3  . carvedilol (COREG) 12.5 MG tablet Take 1 tablet (12.5 mg total) by mouth 2 (two) times daily with a meal. 180 tablet 1  . clopidogrel (PLAVIX) 75 MG tablet Take 1 tablet (75 mg  total) by mouth daily. 90 tablet 3  . cyclobenzaprine (FLEXERIL) 5 MG tablet Take 1 tablet (5 mg total) by mouth at bedtime. 30 tablet 1  . FLUoxetine (PROZAC) 20 MG capsule Take 1 capsule (20 mg total) by mouth daily. 30 capsule 3  . fluticasone (FLONASE) 50 MCG/ACT nasal spray Place 2 sprays into both nostrils daily. 16 g 6  . hydrochlorothiazide (HYDRODIURIL) 25 MG tablet Take 1 tablet (25 mg total) by mouth daily. 90 tablet 3  . levothyroxine (SYNTHROID) 25 MCG tablet TAKE 1 TABLET BY MOUTH ONCE DAILY 120 tablet 0  . meclizine (ANTIVERT) 25 MG tablet Take 1 tablet (25 mg total) by mouth 3 (three) times daily as needed for dizziness or nausea. 60 tablet 1  . nitroGLYCERIN (NITROSTAT) 0.4 MG SL tablet Place 1 tablet (0.4 mg total) under the tongue every 5 (five) minutes as needed for chest pain. 15 tablet 2  . omeprazole (PRILOSEC) 20 MG capsule Take 1 capsule (20 mg total) by mouth daily. 90 capsule 2  . potassium chloride SA (K-DUR,KLOR-CON) 20 MEQ tablet . 90 tablet 11  . pseudoephedrine-acetaminophen (TYLENOL SINUS) 30-500 MG TABS Take 1 tablet by mouth every 4 (four) hours as needed.    . [DISCONTINUED] cetirizine (ZYRTEC) 10 MG tablet Take 10 mg by mouth daily.      . [DISCONTINUED] losartan-hydrochlorothiazide (HYZAAR) 100-25 MG per tablet Take 1 tablet by mouth daily.       No current facility-administered medications for this visit.    Allergies  Review of patient's allergies indicates no known allergies.  Electrocardiogram:   04/28/14 NSR  Normal ECG   Assessment and Plan

## 2014-11-28 ENCOUNTER — Encounter: Payer: Self-pay | Admitting: Cardiovascular Disease

## 2014-11-28 ENCOUNTER — Ambulatory Visit (INDEPENDENT_AMBULATORY_CARE_PROVIDER_SITE_OTHER): Payer: No Typology Code available for payment source | Admitting: Cardiovascular Disease

## 2014-11-28 ENCOUNTER — Encounter: Payer: No Typology Code available for payment source | Admitting: Cardiovascular Disease

## 2014-11-28 VITALS — BP 120/68 | HR 75 | Ht 62.0 in | Wt 152.4 lb

## 2014-11-28 DIAGNOSIS — R413 Other amnesia: Secondary | ICD-10-CM

## 2014-11-28 DIAGNOSIS — I10 Essential (primary) hypertension: Secondary | ICD-10-CM

## 2014-11-28 DIAGNOSIS — E785 Hyperlipidemia, unspecified: Secondary | ICD-10-CM

## 2014-11-28 DIAGNOSIS — I2510 Atherosclerotic heart disease of native coronary artery without angina pectoris: Secondary | ICD-10-CM

## 2014-11-28 HISTORY — DX: Other amnesia: R41.3

## 2014-11-28 NOTE — Assessment & Plan Note (Signed)
Cholesterol is at goal.  Continue current dose of statin and diet Rx.  No myalgias or side effects.  F/U  LFT's in 6 months. Lab Results  Component Value Date   LDLCALC 56 07/06/2014

## 2014-11-28 NOTE — Assessment & Plan Note (Signed)
Ok to continue namenda type medicine Encouraged daughter to seek neur evaluation

## 2014-11-28 NOTE — Patient Instructions (Signed)
Your physician wants you to follow-up in: YEAR WITH DR NISHAN  You will receive a reminder letter in the mail two months in advance. If you don't receive a letter, please call our office to schedule the follow-up appointment.  Your physician recommends that you continue on your current medications as directed. Please refer to the Current Medication list given to you today. 

## 2014-11-28 NOTE — Progress Notes (Signed)
Patient ID: Wendy Petty, female   DOB: 1940/12/16, 74 y.o.   MRN: [id number] Wendy Petty is a 74 y.o. [name] female who returns for history of chest pain and CAD  She is a history of CAD treated medically, HTN, HL, hypothyroidism, prior TIA, depression. Echo (09/16/07): EF 55%, mild LVH. Myoview (8/10): Normal study, EF 66%. ETT-echo (1/13): EF 55%, inferior HK with stress (ischemia could not be ruled out). LHC (09/12/11): Mid LAD 40, ostial D2 90 (small caliber), distal D3 20-30, distal RCA 40-50, mid PDA 30, EF 55-60%. Medical therapy recommended.   F/U myovue 07/14/13 normal EF 68%   History with intepretor Some LE edema Antidepressant seems to be helping mood Has orange card and sees primary at [facility]  Needs refill on plavix and lipitor Memory very poor got namenda equivalent in [place]  Also has some carpal tunnel symptoms in left hand     ROS: Denies fever, malais, weight loss, blurry vision, decreased visual acuity, cough, sputum, SOB, hemoptysis, pleuritic pain, palpitaitons, heartburn, abdominal pain, melena, lower extremity edema, claudication, or rash.  All other systems reviewed and negative  General: Affect appropriate Healthy:  appears stated age 37: normal Neck supple with no adenopathy JVP normal no bruits no thyromegaly Lungs clear with no wheezing and good diaphragmatic motion Heart:  S1/S2 no murmur, no rub, gallop or click PMI normal Abdomen: benighn, BS positve, no tenderness, no AAA no bruit.  No HSM or HJR Distal pulses intact with no bruits No edema Neuro non-focal Skin warm and dry mild brusing  No muscular weakness   Current Outpatient Prescriptions  Medication Sig Dispense Refill  . amLODipine (NORVASC) 5 MG tablet Take 1 tablet (5 mg total) by mouth daily. 90 tablet 3  . atorvastatin (LIPITOR) 40 MG tablet Take 1 tablet (40 mg total) by mouth daily. 90 tablet 3  . carvedilol (COREG) 12.5 MG tablet Take 1 tablet (12.5 mg total) by mouth 2 (two)  times daily with a meal. 180 tablet 1  . clopidogrel (PLAVIX) 75 MG tablet Take 1 tablet (75 mg total) by mouth daily. 90 tablet 3  . cyclobenzaprine (FLEXERIL) 5 MG tablet Take 1 tablet (5 mg total) by mouth at bedtime. 30 tablet 1  . FLUoxetine (PROZAC) 20 MG capsule Take 1 capsule (20 mg total) by mouth daily. 30 capsule 3  . fluticasone (FLONASE) 50 MCG/ACT nasal spray Place 2 sprays into both nostrils daily. 16 g 6  . hydrochlorothiazide (HYDRODIURIL) 25 MG tablet Take 1 tablet (25 mg total) by mouth daily. 90 tablet 3  . levothyroxine (SYNTHROID) 25 MCG tablet TAKE 1 TABLET BY MOUTH ONCE DAILY 120 tablet 0  . loratadine (CLARITIN) 10 MG tablet Take 10 mg by mouth daily.    . meclizine (ANTIVERT) 25 MG tablet Take 1 tablet (25 mg total) by mouth 3 (three) times daily as needed for dizziness or nausea. 60 tablet 1  . memantine (NAMENDA) 10 MG tablet Take 10 mg by mouth daily. Pt states she takes 1/4 of the tablet by mouth daily    . nitroGLYCERIN (NITROSTAT) 0.4 MG SL tablet Place 1 tablet (0.4 mg total) under the tongue every 5 (five) minutes as needed for chest pain. 15 tablet 2  . omeprazole (PRILOSEC) 20 MG capsule Take 1 capsule (20 mg total) by mouth daily. 90 capsule 2  . potassium chloride SA (K-DUR,KLOR-CON) 20 MEQ tablet . 90 tablet 11  . pseudoephedrine-acetaminophen (TYLENOL SINUS) 30-500 MG TABS Take 1 tablet by mouth  every 4 (four) hours as needed.    . [DISCONTINUED] cetirizine (ZYRTEC) 10 MG tablet Take 10 mg by mouth daily.      . [DISCONTINUED] losartan-hydrochlorothiazide (HYZAAR) 100-25 MG per tablet Take 1 tablet by mouth daily.       No current facility-administered medications for this visit.    Allergies  Review of patient's allergies indicates no known allergies.  Electrocardiogram:  NSR  Normal ECG   Assessment and Plan

## 2014-11-28 NOTE — Assessment & Plan Note (Signed)
Stable with no angina and good activity level.  Continue medical Rx  

## 2014-11-28 NOTE — Assessment & Plan Note (Signed)
Well controlled.  Continue current medications and low sodium Dash type diet.    

## 2014-12-05 ENCOUNTER — Ambulatory Visit: Payer: No Typology Code available for payment source | Attending: Internal Medicine | Admitting: Internal Medicine

## 2014-12-05 ENCOUNTER — Encounter: Payer: Self-pay | Admitting: Internal Medicine

## 2014-12-05 VITALS — BP 140/80 | HR 114 | Temp 98.1°F | Ht 62.0 in | Wt 151.8 lb

## 2014-12-05 DIAGNOSIS — K219 Gastro-esophageal reflux disease without esophagitis: Secondary | ICD-10-CM | POA: Insufficient documentation

## 2014-12-05 DIAGNOSIS — R413 Other amnesia: Secondary | ICD-10-CM

## 2014-12-05 DIAGNOSIS — Z8673 Personal history of transient ischemic attack (TIA), and cerebral infarction without residual deficits: Secondary | ICD-10-CM | POA: Insufficient documentation

## 2014-12-05 DIAGNOSIS — R202 Paresthesia of skin: Secondary | ICD-10-CM

## 2014-12-05 DIAGNOSIS — E039 Hypothyroidism, unspecified: Secondary | ICD-10-CM | POA: Insufficient documentation

## 2014-12-05 DIAGNOSIS — I10 Essential (primary) hypertension: Secondary | ICD-10-CM

## 2014-12-05 DIAGNOSIS — R51 Headache: Secondary | ICD-10-CM | POA: Insufficient documentation

## 2014-12-05 DIAGNOSIS — E038 Other specified hypothyroidism: Secondary | ICD-10-CM

## 2014-12-05 DIAGNOSIS — I2510 Atherosclerotic heart disease of native coronary artery without angina pectoris: Secondary | ICD-10-CM | POA: Insufficient documentation

## 2014-12-05 DIAGNOSIS — M62838 Other muscle spasm: Secondary | ICD-10-CM

## 2014-12-05 DIAGNOSIS — E785 Hyperlipidemia, unspecified: Secondary | ICD-10-CM | POA: Insufficient documentation

## 2014-12-05 DIAGNOSIS — M6248 Contracture of muscle, other site: Secondary | ICD-10-CM

## 2014-12-05 DIAGNOSIS — M542 Cervicalgia: Secondary | ICD-10-CM | POA: Insufficient documentation

## 2014-12-05 LAB — COMPLETE METABOLIC PANEL WITH GFR
ALK PHOS: 80 U/L (ref 39–117)
ALT: 16 U/L (ref 0–35)
AST: 16 U/L (ref 0–37)
Albumin: 4 g/dL (ref 3.5–5.2)
BILIRUBIN TOTAL: 0.4 mg/dL (ref 0.2–1.2)
BUN: 15 mg/dL (ref 6–23)
CO2: 27 meq/L (ref 19–32)
Calcium: 9.5 mg/dL (ref 8.4–10.5)
Chloride: 102 mEq/L (ref 96–112)
Creat: 0.98 mg/dL (ref 0.50–1.10)
GFR, EST AFRICAN AMERICAN: 67 mL/min
GFR, EST NON AFRICAN AMERICAN: 58 mL/min — AB
Glucose, Bld: 98 mg/dL (ref 70–99)
Potassium: 4.1 mEq/L (ref 3.5–5.3)
SODIUM: 143 meq/L (ref 135–145)
TOTAL PROTEIN: 7.5 g/dL (ref 6.0–8.3)

## 2014-12-05 LAB — TSH: TSH: 1.747 u[IU]/mL (ref 0.350–4.500)

## 2014-12-05 NOTE — Patient Instructions (Signed)
DASH Eating Plan °DASH stands for "Dietary Approaches to Stop Hypertension." The DASH eating plan is a healthy eating plan that has been shown to reduce high blood pressure (hypertension). Additional health benefits may include reducing the risk of type 2 diabetes mellitus, heart disease, and stroke. The DASH eating plan may also help with weight loss. °WHAT DO I NEED TO KNOW ABOUT THE DASH EATING PLAN? °For the DASH eating plan, you will follow these general guidelines: °· Choose foods with a percent daily value for sodium of less than 5% (as listed on the food label). °· Use salt-free seasonings or herbs instead of table salt or sea salt. °· Check with your health care provider or pharmacist before using salt substitutes. °· Eat lower-sodium products, often labeled as "lower sodium" or "no salt added." °· Eat fresh foods. °· Eat more vegetables, fruits, and low-fat dairy products. °· Choose whole grains. Look for the word "whole" as the first word in the ingredient list. °· Choose fish and skinless chicken or turkey more often than red meat. Limit fish, poultry, and meat to 6 oz (170 g) each day. °· Limit sweets, desserts, sugars, and sugary drinks. °· Choose heart-healthy fats. °· Limit cheese to 1 oz (28 g) per day. °· Eat more home-cooked food and less restaurant, buffet, and fast food. °· Limit fried foods. °· Cook foods using methods other than frying. °· Limit canned vegetables. If you do use them, rinse them well to decrease the sodium. °· When eating at a restaurant, ask that your food be prepared with less salt, or no salt if possible. °WHAT FOODS CAN I EAT? °Seek help from a dietitian for individual calorie needs. °Grains °Whole grain or whole wheat bread. Brown rice. Whole grain or whole wheat pasta. Quinoa, bulgur, and whole grain cereals. Low-sodium cereals. Corn or whole wheat flour tortillas. Whole grain cornbread. Whole grain crackers. Low-sodium crackers. °Vegetables °Fresh or frozen vegetables  (raw, steamed, roasted, or grilled). Low-sodium or reduced-sodium tomato and vegetable juices. Low-sodium or reduced-sodium tomato sauce and paste. Low-sodium or reduced-sodium canned vegetables.  °Fruits °All fresh, canned (in natural juice), or frozen fruits. °Meat and Other Protein Products °Ground beef (85% or leaner), grass-fed beef, or beef trimmed of fat. Skinless chicken or turkey. Ground chicken or turkey. Pork trimmed of fat. All fish and seafood. Eggs. Dried beans, peas, or lentils. Unsalted nuts and seeds. Unsalted canned beans. °Dairy °Low-fat dairy products, such as skim or 1% milk, 2% or reduced-fat cheeses, low-fat ricotta or cottage cheese, or plain low-fat yogurt. Low-sodium or reduced-sodium cheeses. °Fats and Oils °Tub margarines without trans fats. Light or reduced-fat mayonnaise and salad dressings (reduced sodium). Avocado. Safflower, olive, or canola oils. Natural peanut or almond butter. °Other °Unsalted popcorn and pretzels. °The items listed above may not be a complete list of recommended foods or beverages. Contact your dietitian for more options. °WHAT FOODS ARE NOT RECOMMENDED? °Grains °White bread. White pasta. White rice. Refined cornbread. Bagels and croissants. Crackers that contain trans fat. °Vegetables °Creamed or fried vegetables. Vegetables in a cheese sauce. Regular canned vegetables. Regular canned tomato sauce and paste. Regular tomato and vegetable juices. °Fruits °Dried fruits. Canned fruit in light or heavy syrup. Fruit juice. °Meat and Other Protein Products °Fatty cuts of meat. Ribs, chicken wings, bacon, sausage, bologna, salami, chitterlings, fatback, hot dogs, bratwurst, and packaged luncheon meats. Salted nuts and seeds. Canned beans with salt. °Dairy °Whole or 2% milk, cream, half-and-half, and cream cheese. Whole-fat or sweetened yogurt. Full-fat   cheeses or blue cheese. Nondairy creamers and whipped toppings. Processed cheese, cheese spreads, or cheese  curds. °Condiments °Onion and garlic salt, seasoned salt, table salt, and sea salt. Canned and packaged gravies. Worcestershire sauce. Tartar sauce. Barbecue sauce. Teriyaki sauce. Soy sauce, including reduced sodium. Steak sauce. Fish sauce. Oyster sauce. Cocktail sauce. Horseradish. Ketchup and mustard. Meat flavorings and tenderizers. Bouillon cubes. Hot sauce. Tabasco sauce. Marinades. Taco seasonings. Relishes. °Fats and Oils °Butter, stick margarine, lard, shortening, ghee, and bacon fat. Coconut, palm kernel, or palm oils. Regular salad dressings. °Other °Pickles and olives. Salted popcorn and pretzels. °The items listed above may not be a complete list of foods and beverages to avoid. Contact your dietitian for more information. °WHERE CAN I FIND MORE INFORMATION? °National [employer], [employer], [employer] Institute: www.nhlbi.nih.gov/health/health-topics/topics/dash/ °Document Released: 08/15/2011 Document Revised: 01/10/2014 Document Reviewed: 06/30/2013 °Exit[employer]® Patient Information ©2015 [employer]. This information is not intended to replace advice given to you by your health care provider. Make sure you discuss any questions you have with your health care provider. ° °

## 2014-12-05 NOTE — Progress Notes (Signed)
Patient presents today with her daughter for visit. patient has been having neck pain for 3 months now which is starting to make her headaches worse. Pain at the moment is 8/10. Patient states that she is taking medication as prescribed and does not need any refills at the moment.

## 2014-12-05 NOTE — Progress Notes (Signed)
MRN: [id number] Name: Wendy Petty  Sex: female Age: 74 y.o. DOB: [birth year]  Allergies: Review of patient's allergies indicates no known allergies.  Chief Complaint  Patient presents with  . Neck Pain  . Headache    HPI: Patient is 74 y.o. female who history of TIAs, hypertension, hypothyroidism, CAD, complete with a family member comes for the follow followup, today she is requesting referral to see a neurologist since she has worsening memory issues, currently taking Namenda she's also complaining of discomfort in her neck denies any recent fall or trauma, she also has occasional numbness tingling in her hands, currently she denies any headache dizziness chest and shortness of breath,today her blood pressure is borderline elevated she has not taken the blood pressure medication today but has taken thyroid medication.  Past Medical History  Diagnosis Date  . Hypertension   . GERD (gastroesophageal reflux disease)   . HYPERLIPIDEMIA   . THYROID NODULE   . PULMONARY NODULE   . Edema   . CHEST PAIN UNSPECIFIED   . ANEMIA, CHRONIC   . Hx of cardiovascular stress test     Lexiscan Myoview (11/14):  No ischemia, EF 68% (normal study)    Past Surgical History  Procedure Laterality Date  . Vaginal hysterectomy  1989  . Tubal ligation    . Left heart catheterization with coronary angiogram N/A 09/16/2011    Procedure: LEFT HEART CATHETERIZATION WITH CORONARY ANGIOGRAM;  Surgeon: [name], MD;  Location: [facility] CATH LAB;  Service: Cardiovascular;  Laterality: N/A;      Medication List       This list is accurate as of: 12/05/14 12:58 PM.  Always use your most recent med list.               amLODipine 5 MG tablet  Commonly known as:  NORVASC  Take 1 tablet (5 mg total) by mouth daily.     atorvastatin 40 MG tablet  Commonly known as:  LIPITOR  Take 1 tablet (40 mg total) by mouth daily.     carvedilol 12.5 MG tablet  Commonly known as:  COREG  Take 1 tablet  (12.5 mg total) by mouth 2 (two) times daily with a meal.     clopidogrel 75 MG tablet  Commonly known as:  PLAVIX  Take 1 tablet (75 mg total) by mouth daily.     cyclobenzaprine 5 MG tablet  Commonly known as:  FLEXERIL  Take 1 tablet (5 mg total) by mouth at bedtime.     FLUoxetine 20 MG capsule  Commonly known as:  PROZAC  Take 1 capsule (20 mg total) by mouth daily.     fluticasone 50 MCG/ACT nasal spray  Commonly known as:  FLONASE  Place 2 sprays into both nostrils daily.     hydrochlorothiazide 25 MG tablet  Commonly known as:  HYDRODIURIL  Take 1 tablet (25 mg total) by mouth daily.     levothyroxine 25 MCG tablet  Commonly known as:  SYNTHROID  TAKE 1 TABLET BY MOUTH ONCE DAILY     loratadine 10 MG tablet  Commonly known as:  CLARITIN  Take 10 mg by mouth daily.     meclizine 25 MG tablet  Commonly known as:  ANTIVERT  Take 1 tablet (25 mg total) by mouth 3 (three) times daily as needed for dizziness or nausea.     memantine 10 MG tablet  Commonly known as:  NAMENDA  Take 10 mg by mouth  daily. Pt states she takes 1/4 of the tablet by mouth daily     nitroGLYCERIN 0.4 MG SL tablet  Commonly known as:  NITROSTAT  Place 1 tablet (0.4 mg total) under the tongue every 5 (five) minutes as needed for chest pain.     omeprazole 20 MG capsule  Commonly known as:  PRILOSEC  Take 1 capsule (20 mg total) by mouth daily.     potassium chloride SA 20 MEQ tablet  Commonly known as:  K-DUR,KLOR-CON  .     pseudoephedrine-acetaminophen 30-500 MG Tabs  Commonly known as:  TYLENOL SINUS  Take 1 tablet by mouth every 4 (four) hours as needed.        No orders of the defined types were placed in this encounter.    Immunization History  Administered Date(s) Administered  . Influenza,inj,Quad PF,36+ Mos 07/08/2013, 07/06/2014    No family history on file.  History  Substance Use Topics  . Smoking status: Never Smoker   . Smokeless tobacco: Never Used  .  Alcohol Use: No    Review of Systems   As noted in HPI  Filed Vitals:   12/05/14 1231  BP: 140/80  Pulse:   Temp:     Physical Exam  Physical Exam  Constitutional: She is oriented to person, place, and time. No distress.  Eyes: EOM are normal. Pupils are equal, round, and reactive to light.  Neck: Neck supple.  Cardiovascular: Normal rate and regular rhythm.   Pulmonary/Chest: Breath sounds normal. No respiratory distress. She has no wheezes. She has no rales.  Musculoskeletal: She exhibits no edema.  Neurological: She is alert and oriented to person, place, and time. She has normal reflexes. No cranial nerve deficit. She exhibits normal muscle tone. Coordination normal.    CBC    Component Value Date/Time   WBC 6.0 04/28/2014 1216   RBC 4.38 04/28/2014 1216   RBC 4.13* 09/14/2007 1810   HGB 12.6 04/28/2014 1216   HCT 38.2 04/28/2014 1216   PLT 283.0 04/28/2014 1216   MCV 87.2 04/28/2014 1216   LYMPHSABS 2.0 06/28/2013 1820   MONOABS 0.5 06/28/2013 1820   EOSABS 0.2 06/28/2013 1820   BASOSABS 0.0 06/28/2013 1820    CMP     Component Value Date/Time   NA 141 07/06/2014 1222   K 4.2 07/06/2014 1222   CL 106 07/06/2014 1222   CO2 26 07/06/2014 1222   GLUCOSE 94 07/06/2014 1222   BUN 15 07/06/2014 1222   CREATININE 0.91 07/06/2014 1222   CREATININE 1.2 05/19/2014 1127   CALCIUM 9.1 07/06/2014 1222   PROT 7.3 07/06/2014 1222   ALBUMIN 4.0 07/06/2014 1222   AST 15 07/06/2014 1222   ALT 13 07/06/2014 1222   ALKPHOS 71 07/06/2014 1222   BILITOT 0.5 07/06/2014 1222   GFRNONAA 63 07/06/2014 1222   GFRNONAA 40* 06/29/2013 0536   GFRAA 73 07/06/2014 1222   GFRAA 46* 06/29/2013 0536    Lab Results  Component Value Date/Time   CHOL 128 07/06/2014 12:22 PM    No components found for: HGA1C  Lab Results  Component Value Date/Time   AST 15 07/06/2014 12:22 PM    Assessment and Plan  Essential hypertension - Plan: advised patient for DASH diet, continue  with current medications COMPLETE METABOLIC PANEL WITH GFR  Other specified hypothyroidism - Plan:currently patient is on levothyroxine 25 mcg daily, recheck  TSH  Memory changes /history of TIA - Plan: currently patient is taking Namenda, Ambulatory referral  to Neurology  Neck muscle spasm Apply heating pad, Flexeril when necessary  Hand paresthesia, unspecified laterality - Plan: Ambulatory referral to Neurology    Return in about 3 months (around 03/07/2015), or if symptoms worsen or fail to improve, for hypothyroid, hypertension.   This note has been created with Surveyor, quantity. Any transcriptional errors are unintentional.    Lorayne Marek, MD

## 2014-12-07 ENCOUNTER — Telehealth: Payer: Self-pay | Admitting: *Deleted

## 2014-12-07 NOTE — Telephone Encounter (Signed)
Left voice message to return call (Message left in Spanish)     Notes Recorded by Lorayne Marek, MD on 12/06/2014 at 9:33 AM Call and let the patient know that her TSH level is in normal range, continue with current dose of levothyroxine 25 mcg daily

## 2014-12-08 NOTE — Telephone Encounter (Signed)
Pt aware of results Advised to continue taking medication

## 2014-12-14 ENCOUNTER — Ambulatory Visit (INDEPENDENT_AMBULATORY_CARE_PROVIDER_SITE_OTHER): Payer: Self-pay | Admitting: Neurology

## 2014-12-14 ENCOUNTER — Encounter: Payer: Self-pay | Admitting: Neurology

## 2014-12-14 VITALS — BP 140/84 | HR 80 | Ht 62.0 in | Wt 155.0 lb

## 2014-12-14 DIAGNOSIS — F0150 Vascular dementia without behavioral disturbance: Secondary | ICD-10-CM

## 2014-12-14 DIAGNOSIS — R202 Paresthesia of skin: Secondary | ICD-10-CM

## 2014-12-14 DIAGNOSIS — M542 Cervicalgia: Secondary | ICD-10-CM

## 2014-12-14 MED ORDER — GABAPENTIN 100 MG PO CAPS: ORAL_CAPSULE | ORAL | Status: DC

## 2014-12-14 MED ORDER — DONEPEZIL HCL 5 MG PO TABS: ORAL_TABLET | ORAL | Status: DC

## 2014-12-14 NOTE — Patient Instructions (Addendum)
1. Start Gabapentin 100 mg tablets     Morning       Afternoon        Evening   Week 1                                  1 tab               Week 2 1 tab                   1 tab               Continue  1 tab          1 tab            1 tab         Call with update in 1 month, to determine further increase in medication.  If you develop increased sleepiness, stay at the lower dose.           2.  Start Aricept 5mg  daily for one month, then increase to 10mg  daily.    3.  Start neck physiotherapy  4.  Check vitamin B12  5.  I will mail you a letter for immigration regarding your memory problems  6.  If your neck pain or hand tingling worsens, call my office to schedule follow-up
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
## 2014-12-21 ENCOUNTER — Ambulatory Visit: Payer: No Typology Code available for payment source | Admitting: Internal Medicine

## 2015-01-04 ENCOUNTER — Ambulatory Visit: Payer: No Typology Code available for payment source | Attending: Internal Medicine

## 2015-01-10 ENCOUNTER — Ambulatory Visit: Payer: No Typology Code available for payment source | Admitting: Neurology

## 2015-01-24 ENCOUNTER — Encounter: Payer: Self-pay | Admitting: Neurology

## 2015-02-08 ENCOUNTER — Ambulatory Visit: Payer: No Typology Code available for payment source | Admitting: Internal Medicine

## 2015-02-14 ENCOUNTER — Other Ambulatory Visit: Payer: Self-pay | Admitting: Internal Medicine

## 2015-02-27 ENCOUNTER — Ambulatory Visit: Payer: No Typology Code available for payment source | Admitting: Internal Medicine

## 2015-03-17 ENCOUNTER — Ambulatory Visit: Payer: No Typology Code available for payment source | Attending: Internal Medicine | Admitting: Internal Medicine

## 2015-03-17 VITALS — BP 130/70 | HR 68 | Temp 97.4°F | Resp 18 | Ht 59.0 in | Wt 152.0 lb

## 2015-03-17 DIAGNOSIS — Z7902 Long term (current) use of antithrombotics/antiplatelets: Secondary | ICD-10-CM | POA: Insufficient documentation

## 2015-03-17 DIAGNOSIS — I10 Essential (primary) hypertension: Secondary | ICD-10-CM

## 2015-03-17 DIAGNOSIS — R42 Dizziness and giddiness: Secondary | ICD-10-CM | POA: Insufficient documentation

## 2015-03-17 DIAGNOSIS — F411 Generalized anxiety disorder: Secondary | ICD-10-CM | POA: Insufficient documentation

## 2015-03-17 DIAGNOSIS — Z8673 Personal history of transient ischemic attack (TIA), and cerebral infarction without residual deficits: Secondary | ICD-10-CM | POA: Insufficient documentation

## 2015-03-17 DIAGNOSIS — E038 Other specified hypothyroidism: Secondary | ICD-10-CM

## 2015-03-17 DIAGNOSIS — K219 Gastro-esophageal reflux disease without esophagitis: Secondary | ICD-10-CM | POA: Insufficient documentation

## 2015-03-17 DIAGNOSIS — E039 Hypothyroidism, unspecified: Secondary | ICD-10-CM | POA: Insufficient documentation

## 2015-03-17 DIAGNOSIS — Z7982 Long term (current) use of aspirin: Secondary | ICD-10-CM | POA: Insufficient documentation

## 2015-03-17 DIAGNOSIS — Z79899 Other long term (current) drug therapy: Secondary | ICD-10-CM | POA: Insufficient documentation

## 2015-03-17 DIAGNOSIS — D649 Anemia, unspecified: Secondary | ICD-10-CM | POA: Insufficient documentation

## 2015-03-17 DIAGNOSIS — E785 Hyperlipidemia, unspecified: Secondary | ICD-10-CM | POA: Insufficient documentation

## 2015-03-17 LAB — COMPLETE METABOLIC PANEL WITH GFR
ALBUMIN: 4.1 g/dL (ref 3.5–5.2)
ALT: 13 U/L (ref 0–35)
AST: 14 U/L (ref 0–37)
Alkaline Phosphatase: 77 U/L (ref 39–117)
BUN: 18 mg/dL (ref 6–23)
CALCIUM: 9 mg/dL (ref 8.4–10.5)
CO2: 29 mEq/L (ref 19–32)
Chloride: 100 mEq/L (ref 96–112)
Creat: 1.07 mg/dL (ref 0.50–1.10)
GFR, EST NON AFRICAN AMERICAN: 52 mL/min — AB
GFR, Est African American: 60 mL/min
GLUCOSE: 90 mg/dL (ref 70–99)
Potassium: 3.6 mEq/L (ref 3.5–5.3)
Sodium: 141 mEq/L (ref 135–145)
Total Bilirubin: 0.5 mg/dL (ref 0.2–1.2)
Total Protein: 7.5 g/dL (ref 6.0–8.3)

## 2015-03-17 LAB — TSH: TSH: 1.43 u[IU]/mL (ref 0.350–4.500)

## 2015-03-17 MED ORDER — MECLIZINE HCL 25 MG PO TABS: 25.0000 mg | ORAL_TABLET | Freq: Three times a day (TID) | ORAL | Status: DC | PRN
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
## 2015-03-20 ENCOUNTER — Telehealth: Payer: Self-pay

## 2015-03-20 NOTE — Telephone Encounter (Signed)
-----   Message from Lorayne Marek, MD sent at 03/20/2015 10:07 AM EDT ----- Call and let the patient know that her TSH level is in normal range continue with current dose of levothyroxine

## 2015-03-20 NOTE — Telephone Encounter (Signed)
In house interpreter used Patient not available Message left on voice mail to return our call

## 2015-04-12 ENCOUNTER — Emergency Department (HOSPITAL_COMMUNITY)
Admission: EM | Admit: 2015-04-12 | Discharge: 2015-04-12 | Disposition: A | Discharge status: Home / Self Care | Payer: No Typology Code available for payment source | Source: Home / Self Care | Attending: Family Medicine | Admitting: Family Medicine

## 2015-04-12 ENCOUNTER — Encounter (HOSPITAL_COMMUNITY): Payer: Self-pay | Admitting: Emergency Medicine

## 2015-04-12 DIAGNOSIS — I10 Essential (primary) hypertension: Secondary | ICD-10-CM

## 2015-04-12 DIAGNOSIS — M25562 Pain in left knee: Secondary | ICD-10-CM

## 2015-04-12 MED ORDER — DICLOFENAC SODIUM 75 MG PO TBEC: 75.0000 mg | DELAYED_RELEASE_TABLET | Freq: Two times a day (BID) | ORAL | Status: DC

## 2015-04-12 NOTE — ED Provider Notes (Signed)
CSN: [id number]     Arrival date & time 04/12/15  1302 History   First MD Initiated Contact with Patient 04/12/15 1337     Chief Complaint  Patient presents with  . Knee Pain   (Consider location/radiation/quality/duration/timing/severity/associated sxs/prior Treatment) HPI  L knee pain: ongoing for a couple months. Acute worsening for 2 days. Throbbing. No trauma.  Mild pain in knee in past. Pooping catching. No falls. Intermittent. Throbbing to achy in nature. Denies swelling, fevers, rash, falls, nausea, vomiting, chest pain, palpitations.   Past Medical History  Diagnosis Date  . Hypertension   . GERD (gastroesophageal reflux disease)   . HYPERLIPIDEMIA   . THYROID NODULE   . PULMONARY NODULE   . Edema   . CHEST PAIN UNSPECIFIED   . ANEMIA, CHRONIC   . Hx of cardiovascular stress test     Lexiscan Myoview (11/14):  No ischemia, EF 68% (normal study)   Past Surgical History  Procedure Laterality Date  . Vaginal hysterectomy  1989  . Tubal ligation    . Left heart catheterization with coronary angiogram N/A 09/16/2011    Procedure: LEFT HEART CATHETERIZATION WITH CORONARY ANGIOGRAM;  Surgeon: [name], MD;  Location: [facility] CATH LAB;  Service: Cardiovascular;  Laterality: N/A;   No family history on file. History  Substance Use Topics  . Smoking status: Never Smoker   . Smokeless tobacco: Never Used  . Alcohol Use: No   OB History    No data available     Review of Systems Per HPI with all other pertinent systems negative.   Allergies  Review of patient's allergies indicates no known allergies.  Home Medications   Prior to Admission medications   Medication Sig Start Date End Date Taking? Authorizing Provider  amLODipine (NORVASC) 5 MG tablet Take 1 tablet (5 mg total) by mouth daily. 07/22/14   Lorayne Marek, MD  aspirin 81 MG tablet Take 81 mg by mouth daily.    Historical Provider, MD  atorvastatin (LIPITOR) 40 MG tablet Take 1 tablet (40 mg total) by  mouth daily. 07/22/14   Lorayne Marek, MD  carvedilol (COREG) 12.5 MG tablet Take 1 tablet (12.5 mg total) by mouth 2 (two) times daily with a meal. 07/22/14   Lorayne Marek, MD  clopidogrel (PLAVIX) 75 MG tablet Take 1 tablet (75 mg total) by mouth daily. 04/28/14   Josue Hector, MD  cyclobenzaprine (FLEXERIL) 5 MG tablet Take 1 tablet (5 mg total) by mouth at bedtime. Patient not taking: Reported on 03/17/2015 07/06/14   Lorayne Marek, MD  diclofenac (VOLTAREN) 75 MG EC tablet Take 1 tablet (75 mg total) by mouth 2 (two) times daily. 04/12/15   Waldemar Dickens, MD  donepezil (ARICEPT) 5 MG tablet Take 1 tablet daily for one month, then increase to 2 tablets daily. 12/14/14   Donika K Patel, DO  FLUoxetine (PROZAC) 20 MG capsule Take 1 capsule (20 mg total) by mouth daily. Patient not taking: Reported on 03/17/2015 07/08/13   Thurnell Lose, MD  fluticasone [facility]) 50 MCG/ACT nasal spray Place 2 sprays into both nostrils daily. Patient not taking: Reported on 03/17/2015 07/22/14   Lorayne Marek, MD  gabapentin (NEURONTIN) 100 MG capsule Take 1 tablet at bedtime x 1 week, then increase to 2 tab at bedtime x 1 week, then 3 tablets at bedtime 12/14/14   Donika K Patel, DO  hydrochlorothiazide (HYDRODIURIL) 25 MG tablet Take 1 tablet (25 mg total) by mouth daily. 07/22/14  Lorayne Marek, MD  levothyroxine (SYNTHROID) 25 MCG tablet TAKE 1 TABLET BY MOUTH ONCE DAILY 07/22/14   Lorayne Marek, MD  loratadine (CLARITIN) 10 MG tablet Take 10 mg by mouth daily.    Historical Provider, MD  meclizine (ANTIVERT) 25 MG tablet Take 1 tablet (25 mg total) by mouth 3 (three) times daily as needed for dizziness or nausea. 03/17/15   Lorayne Marek, MD  memantine (NAMENDA) 10 MG tablet Take 10 mg by mouth daily. Pt states she takes 1/4 of the tablet by mouth daily    Historical Provider, MD  nitroGLYCERIN (NITROSTAT) 0.4 MG SL tablet Place 1 tablet (0.4 mg total) under the tongue every 5 (five) minutes as needed for chest  pain. 07/08/13   Thurnell Lose, MD  omeprazole (PRILOSEC) 20 MG capsule Take 1 capsule (20 mg total) by mouth daily. 07/22/14   Lorayne Marek, MD  potassium chloride SA (K-DUR,KLOR-CON) 20 MEQ tablet . Patient not taking: Reported on 03/17/2015 07/22/14   Lorayne Marek, MD  pseudoephedrine-acetaminophen (TYLENOL SINUS) 30-500 MG TABS Take 1 tablet by mouth every 4 (four) hours as needed.    Historical Provider, MD   BP 155/71 mmHg  Pulse 63  Temp(Src) 97.7 F (36.5 C) (Oral)  Resp 12  SpO2 98% Physical Exam Physical Exam  Constitutional: oriented to person, place, and time. appears well-developed and well-nourished. No distress.  HENT:  Head: Normocephalic and atraumatic.  Eyes: EOMI. PERRL.  Neck: Normal range of motion.  Cardiovascular: RRR, no m/r/g, 2+ distal pulses,  Pulmonary/Chest: Effort normal and breath sounds normal. No respiratory distress.  Abdominal: Soft. Bowel sounds are normal. NonTTP, no distension.  Musculoskeletal: Left knee full range of motion, no effusions noted, Lachman's negative, fungus and varus stresses with minimal discomfort, negative apprehension test,. Minimal discomfort on medial joint line palpation  Neurological: alert and oriented to person, place, and time.  Skin: Skin is warm. No rash noted. non diaphoretic.  Psychiatric: normal mood and affect. behavior is normal. Judgment and thought content normal.   ED Course  Procedures (including critical care time) Labs Review Labs Reviewed - No data to display  Imaging Review No results found.   MDM   1. Essential hypertension   2. Left knee pain      Likely secondary to osteoarthritis and acute meniscal injury. The need for emergent Ortho Evra evaluation. Start Voltaren and leg strengthening exercises especially those targeting the vastus medialis. Follow-up with PCP. May need formal physical therapy and/or steroid injections in the future. Patient's blood pressure likely elevated today due to  pain and only taking some of her blood pressure medications. Patient follow-up with her PCP.    Waldemar Dickens, MD 04/12/15 [phone]

## 2015-04-12 NOTE — ED Notes (Signed)
Left knee pain, reports 2 month history of knee pain, but has worsened significantly over the past 2 days.  No known injury.

## 2015-04-12 NOTE — Discharge Instructions (Signed)
He likely are developing arthritis of your knee due to prolonged injury to the cartilage called the meniscus. Please start the Voltaren for pain and inflammation. Please start the leg exercises. Please follow-up with your primary care doctor for further management including physical therapy, steroid knee injections, and eventually frontal to orthopedic surgery. Please also consider using a neoprene knee sleeve for support.  Probablemente estn desarrollando la artritis de la rodilla debido a una lesin prolongada en el cartlago llamado el menisco. Por favor iniciar el Voltaren para el dolor y la inflamacin. Por favor, iniciar los ejercicios de piernas. Por favor, el seguimiento con su mdico de atencin primaria para su posterior gestin, incluyendo terapia fsica, inyecciones de esteroides de la rodilla, y con el tiempo frontal de la ciruga ortopdica. Por favor, considere el uso de una rodillera de neopreno para el apoyo.  Ejercicios para la rodilla (Knee Exercises) EJERCICIOS EJERCICIOS DE AMPLITUD DE MOVIMIENTOS Y ELONGACIN Estos ejercicios lo ayudarn al comienzo de la rehabilitacin. Los sntomas podrn aliviarse con o sin asistencia adicional de su mdico, fisioterapeuta o Administrator, sports. Al completar estos ejercicios, recuerde:   Restaurar la flexibilidad del tejido ayuda a que las articulaciones recuperen el movimiento normal. Esto permite que el movimiento y la actividad sean ms saludables y menos dolorosos.  Para que sea efectiva, cada elongacin debe realizarse durante al menos 30 segundos.  La elongacin nunca debe ser dolorosa. Deber sentir slo un alargamiento o distensin suave del tejido que estira. ELONGACIN Extensin de la rodilla - posicin prona  Recustese sobre el estmago sobre una superficie firme, como una cama o la mesada de la cocina. Coloque la rodilla y la pierna derecha / izquierdo ms all del borde de la superficie. Podr colocar una toalla bajo el extremo ms  alejado del muslo derecha / izquierdo para mayor comodidad.  Relaje los msculos de las piernas y permita que la gravedad enderece la rodilla. El mdico podr indicarle que coloque un peso en el tobillo, si una mayor resistencia podra beneficiarlo.  Debe sentir un estiramiento en la parte trasera de la rodilla derecha / izquierdo. Mantenga esta posicin durante __________ segundos. Reptalo __________ veces. Realice este estiramiento __________ Vicenta Aly por da. *El mdico, fisioterapeuta o Administrator, sports podrn indicarle que aada un peso para hacer ms efectiva la elongacin.  AMPLITUD DE MOVIMIENTOS Flexin de la rodilla - activa  Recustese sobre la espalda con las rodillas rectas. (Si esto le ocasiona molestias en la espalda, doble la rodilla opuesta, apoyando el pie en el suelo).  Lentamente deslice el taln hacia sus nalgas, hasta que sienta una sensacin de estiramiento en la zona de la rodilla o el muslo.  Mantenga esta posicin durante __________ segundos. Vuelva lentamente el taln hasta la posicin inicial. Reptalo __________ veces. Realice este ejercicio __________ veces por da.  ELONGACIN Cudriceps - posicin prona  Recustese sobre el estmago sobre una superficie firme, como una cama o sobre el piso en el que haya colocado [place].  Doble la rodilla derecha / izquierdo y tmese del tobillo. Si no puede alcanzar el tobillo o la pierna del pantaln, use un cinturn alrededor del pie para Programmer, applications.  [place] nalgas. La rodilla no debe desplazase hacia un lado. Debe sentir un estiramiento en la parte anterior del muslo o de la rodilla.  Mantenga esta posicin durante __________ segundos. Reptalo __________ veces. Realice este estiramiento __________ Vicenta Aly por da.  ELONGACIN - Isquiosurales - posicin supina  Acustese sobre la espada. Coloque  un cinturn o una toalla alrededor del tarso de su pie derecha / izquierdo.  Enderece su  rodilla derecha / izquierdo y tire lentamente del cinturn para elevar la pierna. No deje que la rodilla derecha / izquierdo se doble. Mantenga la otra pierna plana contra el suelo.  Eleve la pierna hasta sentir un ligero estiramiento detrs de la rodilla o muslo derecha / izquierdo. Mantenga esta posicin durante __________ segundos. Reptalo __________ veces. Realice este estiramiento __________ Vicenta Aly por da.  EJERCICIOS FORTALECIMIENTO Estos ejercicios lo ayudarn al comienzo de la rehabilitacin. Los sntomas podrn aliviarse con o sin asistencia adicional de su mdico, fisioterapeuta o Administrator, sports. Al completar estos ejercicios, recuerde:   Los msculos pueden ganar tanto la resistencia como la fortaleza que necesita para sus actividades diarias a travs de ejercicios controlados.  Realice los ejercicios como se lo indic el mdico, el fisioterapeuta o Industrial/product designer. Aumente la resistencia y las repeticiones segn se le haya indicado.  Podr experimentar dolor o cansancio muscular, pero el dolor o molestia que trata de eliminar a travs de los ejercicios nunca debe empeorar. Si el dolor empeora, detngase y asegrese de que est siguiendo las directivas correctamente. Si an siente dolor luego de Optometrist lo ajustes necesarios, deber discontinuar el ejercicio hasta que pueda conversar con el profesional sobre el problema. FUERZA Cudriceps - isometra  Recustese sobre la espalda con su pierna derecha / izquierdo extendida y la rodilla opuesta doblada.  Tensione gradualmente los msculos de la zona anterior del muslo derecha / izquierdo. Ver que la rtula se desliza hacia arriba o que se intensifica el hoyuelo que se encuentra justo por arriba de la rodilla. Este movimiento empujar nuevamente la rodilla hacia abajo en direccin al piso, [place] o cama sobre la cual est recostado.  Sostenga de ese modo el msculo, tan tensionado como pueda, sin que [employer], durante __________  segundos.  Relaje los msculos lenta y completamente entre cada repeticin. Reptalo __________ veces. Realice este ejercicio __________ veces por da.  FUERZA Cudriceps - arcos cortos  Acustese [employer] Coloque una toalla enrollada de __________ pulgadas debajo de su rodilla, de modo que se doble ligeramente.  Eleve slo la parte inferior de la pierna mediante la tensin de los msculos de la parte frontal del muslo . No permita que el muslo se eleve.  Mantenga esta posicin durante __________ segundos. Reptalo __________ veces. Realice este ejercicio __________ veces por da.  PESO OPCIONAL EN EL TOBILLO: Comience con ____________________, pero no se exceda de ____________________. Aumente de a 1 libra/0.5 kilograme. [place] piernas rectas Lo que importa es la calidad Observe si el cudriceps est trabajando para estar seguro que est fortaleciendo los msculos correctos y no "haciendo trampa" trabajando con msculos sanos.  Recustese sobre la espalda con su pierna derecha / izquierdo extendida y la rodilla opuesta doblada.  Tensione gradualmente los msculos de la zona anterior del muslo derecha / izquierdo. Ver que la rtula se desliza hacia arriba o que se intensifica el hoyuelo que se encuentra justo por arriba de la rodilla. Podr sentir que el muslo tiembla.  Tensione estos msculos an ms y [employer] pierna 10 a 15 cm del piso. Mantenga esta posicin durante __________ segundos.  Mientras mantiene estos msculos tensionados, baje la pierna.  Relaje los msculos lenta y completamente entre cada repeticin. Reptalo __________ veces. Realice este ejercicio __________ veces por da.  FUERZA Isquiosurales - giros  Acustese sobre el estmago con las piernas extendidas. (  Si est recostado [employer], puede dejar que los pies cuelguen por el borde).  Contraiga los msculos de la zona posterior del muslo para doblar la rodilla derecha /  izquierdo a 90 grados. Mantenga la cadera apoyada en la cama o el piso.  Mantenga esta posicin durante __________ segundos.  Baje lentamente la pierna hasta la posicin inicial. Reptalo __________ veces. Realice este ejercicio __________ veces por da.  PESO OPCIONAL EN EL TOBILLO: Comience con ____________________, pero no se exceda de ____________________. Aumente de a 1 libra/0.5 kilograme. FUERZA Cudriceps en cuclillas  Prese en el marco de la puerta, de modo que los pies y las rodillas queden en lnea con el [place] manos para mantener el equilibrio en el [place], pero no se apoye.  Bjese lentamente, doblando las caderas y [place]. [place] piernas rectas de modo que queden paralelas al marco de la puerta. Pngase en cuclillas slo dentro del lmite en que no sienta dolor en la rodilla. Nunca deje que las caderas lleguen a un nivel inferior de las rodillas.  Vuelva lentamente a la posicin erguida, empujando con las piernas y no con las manos Reptalo __________ [employer]. Realice este ejercicio __________ veces por da.  [place] - deslizarse contra la pared [place] indicaciones con atencin. Un aumento del Social research officer, government en la rodilla generalmente es el resultado de una mala colocacin de los pies o las rodillas.  Recustese contra una pared lisa o [place]delia Mems puerta y camine 18 a 24 pulgadas. Separe los pies a una distancia igual a la separacin de sus caderas.  Deslice lentamente por la pared o la puerta hasta que sus rodillas se doblen __________ Marella Chimes.* Mantenga las rodillas a la altura de los [place], no de los dedos de los pies, y en lnea con las caderas, que no se muevan a los lados.  Mantenga esta posicin durante __________ segundos. Pngase de pie y descanse durante __________ [employer]  cada repeticin. Reptalo __________ veces. Realice este ejercicio __________ veces por da. * Su mdico, fisioterapeuta o entrenador modificar este ngulo segn sus  sntomas y progresos. Document Released: 02/17/2006 Document Revised: 11/18/2011 [facility] Patient Information 2015 [place]. This information is not intended to replace advice given to you by your health care provider. Make sure you discuss any questions you have with your health care provider.

## 2015-04-21 ENCOUNTER — Ambulatory Visit: Payer: No Typology Code available for payment source | Attending: Family Medicine | Admitting: Family Medicine

## 2015-04-21 ENCOUNTER — Encounter: Payer: Self-pay | Admitting: Family Medicine

## 2015-04-21 VITALS — BP 129/85 | HR 71 | Temp 98.1°F | Resp 16 | Ht 60.0 in | Wt 153.8 lb

## 2015-04-21 DIAGNOSIS — G8929 Other chronic pain: Secondary | ICD-10-CM | POA: Insufficient documentation

## 2015-04-21 DIAGNOSIS — K219 Gastro-esophageal reflux disease without esophagitis: Secondary | ICD-10-CM

## 2015-04-21 DIAGNOSIS — E038 Other specified hypothyroidism: Secondary | ICD-10-CM

## 2015-04-21 DIAGNOSIS — M25562 Pain in left knee: Secondary | ICD-10-CM | POA: Insufficient documentation

## 2015-04-21 DIAGNOSIS — R42 Dizziness and giddiness: Secondary | ICD-10-CM

## 2015-04-21 DIAGNOSIS — Z8673 Personal history of transient ischemic attack (TIA), and cerebral infarction without residual deficits: Secondary | ICD-10-CM | POA: Insufficient documentation

## 2015-04-21 DIAGNOSIS — R413 Other amnesia: Secondary | ICD-10-CM

## 2015-04-21 DIAGNOSIS — E785 Hyperlipidemia, unspecified: Secondary | ICD-10-CM

## 2015-04-21 DIAGNOSIS — I10 Essential (primary) hypertension: Secondary | ICD-10-CM | POA: Insufficient documentation

## 2015-04-21 DIAGNOSIS — I2510 Atherosclerotic heart disease of native coronary artery without angina pectoris: Secondary | ICD-10-CM | POA: Insufficient documentation

## 2015-04-21 MED ORDER — LEVOTHYROXINE SODIUM 25 MCG PO TABS: ORAL_TABLET | ORAL | Status: DC

## 2015-04-21 MED ORDER — DONEPEZIL HCL 5 MG PO TABS: 10.0000 mg | ORAL_TABLET | Freq: Every day | ORAL | Status: DC

## 2015-04-21 MED ORDER — MECLIZINE HCL 25 MG PO TABS: 25.0000 mg | ORAL_TABLET | Freq: Three times a day (TID) | ORAL | Status: DC | PRN

## 2015-04-21 MED ORDER — LORATADINE 10 MG PO TABS: 10.0000 mg | ORAL_TABLET | Freq: Every day | ORAL | Status: DC

## 2015-04-21 MED ORDER — OMEPRAZOLE 20 MG PO CPDR: 20.0000 mg | DELAYED_RELEASE_CAPSULE | Freq: Every day | ORAL | Status: DC

## 2015-04-21 MED ORDER — ATORVASTATIN CALCIUM 40 MG PO TABS: 40.0000 mg | ORAL_TABLET | Freq: Every day | ORAL | Status: DC

## 2015-04-21 MED ORDER — CARVEDILOL 12.5 MG PO TABS: 12.5000 mg | ORAL_TABLET | Freq: Two times a day (BID) | ORAL | Status: DC

## 2015-04-21 MED ORDER — METHYLPREDNISOLONE ACETATE 40 MG/ML IJ SUSP
40.0000 mg | Freq: Once | INTRAMUSCULAR | Status: AC
  Administered 2015-04-21: 40 mg via INTRA_ARTICULAR

## 2015-04-21 MED ORDER — AMLODIPINE BESYLATE 5 MG PO TABS: 5.0000 mg | ORAL_TABLET | Freq: Every day | ORAL | Status: DC

## 2015-04-21 MED ORDER — GABAPENTIN 300 MG PO CAPS: 300.0000 mg | ORAL_CAPSULE | Freq: Every day | ORAL | Status: DC

## 2015-04-21 NOTE — Assessment & Plan Note (Signed)
L knee pain: OA suspected  Uric acid check to rule out gout Knee x-rays at your convenience  You have received a shot of steroid in your L knee today  Rest and ice knee today. Regular activity tomorrow. Look out for redness, swelling, fever,severe pain in joint and call if you experience these symptoms.

## 2015-04-21 NOTE — Assessment & Plan Note (Signed)
HTN: BP well controlled Due to history of low potassium please stop HCTZ Increase norvasc to 10 mg daily Continue coreg at current dose  F/u in 3-4 week with RN for BP check F/u in 2 months with me for HTN

## 2015-04-21 NOTE — Patient Instructions (Signed)
Wendy Petty,  Thank you for coming in today  1. L knee pain: Uric acid check to rule out gout Knee x-rays at your convenience  You have received a shot of steroid in your L knee today  Rest and ice knee today. Regular activity tomorrow. Look out for redness, swelling, fever,severe pain in joint and call if you experience these symptoms.  2. HTN: BP well controlled Due to history of low potassium please stop HCTZ Increase norvasc to 10 mg daily Continue coreg at current dose  F/u in 3-4 week with RN for BP check F/u in 2 months with me for HTN  Dr. Adrian Blackwater

## 2015-04-21 NOTE — Progress Notes (Signed)
Patient here for follow up on her left knee pain Patient was recently seen at the urgent care and prescribed votaren But patient states it really does not help Patient has had the knee pain for about a year but the pain has gotten progressively  Worse in the past two weeks

## 2015-04-21 NOTE — Progress Notes (Signed)
   Subjective:    Patient ID: Wendy Petty, female    DOB: Mar 22, 1941, 74 y.o.   MRN: [id number] CC: L knee pain  HPI 74 yo F presents with L knee pain  1. L knee pain: x one year. Worsening over past 2 weeks. Prescribed Voltaren at urgent care. Voltaren has not helped. Pain is medial knee. There is mild swelling. No redness. No recent injury. There is also some R knee laxity lately.  2. HTN:  Has hx of TIA and CAD not requiring stent placement. Taking ASA and plavix for past 2 years. Her cardiologist is [name]. She denies weakness, numbness, chest pain, shortness of breath or leg swelling. She has a hx of hypokalemia while on thiazide diuretic one year ago.   Social History  Substance Use Topics  . Smoking status: Never Smoker   . Smokeless tobacco: Never Used  . Alcohol Use: No   Review of Systems  Constitutional: Negative for fever and chills.  Eyes: Negative for visual disturbance.  Respiratory: Negative for shortness of breath.   Cardiovascular: Negative for chest pain.  Gastrointestinal: Negative for abdominal pain and blood in stool.  Musculoskeletal: Positive for arthralgias. Negative for back pain.       L medial knee pain   Skin: Negative for color change and rash.  Allergic/Immunologic: Negative for immunocompromised state.  Hematological: Negative for adenopathy. Does not bruise/bleed easily.  Psychiatric/Behavioral: Negative for suicidal ideas and dysphoric mood.       Objective:   Physical Exam  Constitutional: She is oriented to person, place, and time. She appears well-developed and well-nourished. No distress.  HENT:  Head: Normocephalic and atraumatic.  Cardiovascular: Normal rate, regular rhythm, normal heart sounds and intact distal pulses.   Pulmonary/Chest: Effort normal and breath sounds normal.  Musculoskeletal: She exhibits no edema or tenderness.       Legs: Neurological: She is alert and oriented to person, place, and time.  Skin: Skin is  warm and dry. No rash noted.  Psychiatric: She has a normal mood and affect.  BP 129/85 mmHg  Pulse 71  Temp(Src) 98.1 F (36.7 C)  Resp 16  Ht 5' (1.524 m)  Wt 153 lb 12.8 oz (69.763 kg)  BMI 30.04 kg/m2  SpO2 100% BP Readings from Last 3 Encounters:  04/21/15 129/85  04/12/15 155/71  03/17/15 130/70      Assessment & Plan:

## 2015-04-22 LAB — URIC ACID: URIC ACID, SERUM: 6.5 mg/dL (ref 2.4–7.0)

## 2015-04-24 ENCOUNTER — Ambulatory Visit (HOSPITAL_COMMUNITY)
Admission: RE | Admit: 2015-04-24 | Discharge: 2015-04-24 | Disposition: A | Discharge status: Home / Self Care | Payer: No Typology Code available for payment source | Source: Ambulatory Visit | Attending: Family Medicine | Admitting: Family Medicine

## 2015-04-24 DIAGNOSIS — M7989 Other specified soft tissue disorders: Secondary | ICD-10-CM | POA: Insufficient documentation

## 2015-04-24 DIAGNOSIS — G8929 Other chronic pain: Secondary | ICD-10-CM

## 2015-04-24 DIAGNOSIS — M25562 Pain in left knee: Secondary | ICD-10-CM | POA: Insufficient documentation

## 2015-04-27 ENCOUNTER — Telehealth: Payer: Self-pay | Admitting: *Deleted

## 2015-04-27 NOTE — Telephone Encounter (Signed)
-----   Message from Boykin Nearing, MD sent at 04/24/2015  3:09 PM EDT ----- Mild L middle knee degenerative arthritis

## 2015-04-27 NOTE — Telephone Encounter (Signed)
-----   Message from Boykin Nearing, MD sent at 04/24/2015  9:12 AM EDT ----- Normal uric acid, no gout

## 2015-04-27 NOTE — Telephone Encounter (Signed)
LVM to return call.

## 2015-05-12 ENCOUNTER — Ambulatory Visit: Payer: No Typology Code available for payment source | Attending: Family Medicine | Admitting: *Deleted

## 2015-05-12 VITALS — BP 129/69 | HR 74 | Temp 98.0°F | Resp 20 | Wt 154.0 lb

## 2015-05-12 DIAGNOSIS — I10 Essential (primary) hypertension: Secondary | ICD-10-CM

## 2015-05-12 MED ORDER — DICLOFENAC SODIUM 75 MG PO TBEC: 75.0000 mg | DELAYED_RELEASE_TABLET | Freq: Two times a day (BID) | ORAL | Status: DC

## 2015-05-12 MED ORDER — AMLODIPINE BESYLATE 10 MG PO TABS: 10.0000 mg | ORAL_TABLET | Freq: Every day | ORAL | Status: DC

## 2015-05-12 NOTE — Progress Notes (Signed)
Spoke with patient via video interpreters, [place] [id number] and Fred[place] [id number] Patient presents for BP check after receiving instructions to stop HCTZ and increase norvasc to 10 mg, however, patient has not done this  Med list reviewed; states taking norvasc 5 mg daily, HCTZ 25 mg daily, carvedilol 12.5 mg bid States has not needed to use nitrostat States took amoxicillin 500 mg q HS X 4 nights (from [place]) for left knee pain. States much relief after taking but "little bit pain" remains Wants to know what to take for left knee pain Lab and left knee x-ray reviewed with patient   Filed Vitals:   05/12/15 1106  BP: 129/69  Pulse: 74  Temp: 98 F (36.7 C)  Resp: 20    Per PCP: Stop HCTZ Increase norvasc to 10 mg daily Return 4 weeks for nurse visit for BP check and flu shot Okay to take voltaren for left knee pain. RX e-scribed to [facility]  Patient advised to call for med refills at least 7 days before running out so as not to go without.

## 2015-06-13 ENCOUNTER — Encounter: Payer: No Typology Code available for payment source | Admitting: Pharmacist

## 2015-06-14 ENCOUNTER — Encounter: Payer: No Typology Code available for payment source | Admitting: Pharmacist

## 2015-06-21 ENCOUNTER — Ambulatory Visit: Payer: No Typology Code available for payment source | Attending: Family Medicine | Admitting: Pharmacist

## 2015-06-21 VITALS — BP 167/77 | HR 80

## 2015-06-21 DIAGNOSIS — I10 Essential (primary) hypertension: Secondary | ICD-10-CM

## 2015-06-21 DIAGNOSIS — Z79899 Other long term (current) drug therapy: Secondary | ICD-10-CM | POA: Insufficient documentation

## 2015-06-21 NOTE — Patient Instructions (Signed)
Schedule an appointment with Dr. Adrian Blackwater next week.  Please go to the emergency room if it gets worse

## 2015-06-21 NOTE — Progress Notes (Signed)
S:    Patient arrives in poor spirits.    Presents to the clinic for hypertension evaluation. Lynann Bologna [phone], video interpreter, was used for the entirety of the visit.  Patient reports adherence with medications.  Current BP Medications include:  Amlodipine 10 mg daily and carvedilol 12.5 mg BID.   Antihypertensives tried in the past include: HCTZ (caused hypokalemia)  Patient reports 9/10 pain in both of her legs, starting at her hips and going all of the way to her feet.  She reports that it usually just occurs in one leg but that it started in the other leg on Sunday. She denies any injury to her back or legs. She reports weakness in her legs but denies any difficulty walking and denies any falls.   She denies headache, she denies visual changes, she denies upper extremities weakness, she denies changes in balance, and she denies slurred speech.  Patient also reports that diclofenac is not improving her pain and that it upsets her stomach when she takes it.   O:   Last 3 Office BP readings: BP Readings from Last 3 Encounters:  06/21/15 167/77  05/12/15 129/69  04/21/15 129/85    BMET    Component Value Date/Time   NA 141 03/17/2015 1233   K 3.6 03/17/2015 1233   CL 100 03/17/2015 1233   CO2 29 03/17/2015 1233   GLUCOSE 90 03/17/2015 1233   BUN 18 03/17/2015 1233   CREATININE 1.07 03/17/2015 1233   CREATININE 1.2 05/19/2014 1127   CALCIUM 9.0 03/17/2015 1233   GFRNONAA 52* 03/17/2015 1233   GFRNONAA 40* 06/29/2013 0536   GFRAA 60 03/17/2015 1233   GFRAA 46* 06/29/2013 0536   Neuro exam normal: no noted weakness or abnormalities  A/P: History of hypertension currently UNcontrolled on current medications likely due to pain and stress. No signs of stroke or other abnormality. Continue current medications and reassess blood pressure regimen once pain is better controlled. Patient instructed to go to the emergency room if the condition worsens or she has any signs or symptoms  of a stroke. Discussed case with Dr. Doreene Burke, patient to follow up with Dr. Adrian Blackwater next week.   Results reviewed and written information provided.   Total time in face-to-face counseling 20 minutes.   F/U Clinic Visit with Dr. Adrian Blackwater next week.

## 2015-06-22 ENCOUNTER — Ambulatory Visit (INDEPENDENT_AMBULATORY_CARE_PROVIDER_SITE_OTHER): Payer: No Typology Code available for payment source | Admitting: Cardiology

## 2015-06-22 ENCOUNTER — Encounter: Payer: Self-pay | Admitting: Cardiology

## 2015-06-22 VITALS — BP 140/60 | HR 78 | Resp 18 | Ht 62.0 in | Wt 159.0 lb

## 2015-06-22 DIAGNOSIS — I10 Essential (primary) hypertension: Secondary | ICD-10-CM

## 2015-06-22 MED ORDER — CARVEDILOL 12.5 MG PO TABS: ORAL_TABLET | ORAL | Status: DC

## 2015-06-22 NOTE — Patient Instructions (Addendum)
Medication Instructions:  Your physician has recommended you make the following change in your medication:  1.  RESTART Coreg 12.5 mg taking 1/2 tablet by mouth 2 times a day for 2 WEEKS, then take 1 tablet by mouth 2 times a day. 2.  RESUME the Lipitor   Labwork: None ordered  Testing/Procedures: None ordered  Follow-Up: Your physician recommends that you schedule a follow-up appointment in: [place] (DOES NOT HAVE TO SEE A PROVIDER)

## 2015-06-22 NOTE — Progress Notes (Signed)
Cardiology Office Note   Date:  06/22/2015   ID:  PAITLYN MCCLATCHEY, DOB 10-Jan-1941, MRN [id number]  PCP:  Minerva Ends, MD  Cardiologist:  [name]    Chief Complaint  Patient presents with  . Hypertension    more elvevated than usual.       History of Present Illness: Wendy Petty is a 74 y.o. female who presents as walk in due to elevated BP.  She was having BPs 175/70  And at times higher for 3 weeks.  No chest pain, no SOB.  Her HCTZ was stopped for hypokalemia but pt also stopped coreg.    Hx listed in problem list.      Past Medical History  Diagnosis Date  . Hypertension   . GERD (gastroesophageal reflux disease)   . HYPERLIPIDEMIA   . THYROID NODULE   . PULMONARY NODULE   . Edema   . CHEST PAIN UNSPECIFIED   . ANEMIA, CHRONIC   . Hx of cardiovascular stress test     Lexiscan Myoview (11/14):  No ischemia, EF 68% (normal study)    Past Surgical History  Procedure Laterality Date  . Vaginal hysterectomy  1989  . Tubal ligation    . Left heart catheterization with coronary angiogram N/A 09/16/2011    Procedure: LEFT HEART CATHETERIZATION WITH CORONARY ANGIOGRAM;  Surgeon: [name], MD;  Location: [facility] CATH LAB;  Service: Cardiovascular;  Laterality: N/A;     Current Outpatient Prescriptions  Medication Sig Dispense Refill  . amLODipine (NORVASC) 10 MG tablet Take 1 tablet (10 mg total) by mouth daily. 90 tablet 0  . aspirin 81 MG tablet Take 81 mg by mouth daily.    . clopidogrel (PLAVIX) 75 MG tablet Take 1 tablet (75 mg total) by mouth daily. 90 tablet 3  . gabapentin (NEURONTIN) 300 MG capsule Take 1 capsule (300 mg total) by mouth at bedtime. 30 capsule 5  . levothyroxine (SYNTHROID) 25 MCG tablet TAKE 1 TABLET BY MOUTH ONCE DAILY 120 tablet 0  . loratadine (CLARITIN) 10 MG tablet Take 1 tablet (10 mg total) by mouth daily. 30 tablet 5  . meclizine (ANTIVERT) 25 MG tablet Take 1 tablet (25 mg total) by mouth 3 (three) times daily as  needed for dizziness or nausea. 60 tablet 1  . memantine (NAMENDA) 10 MG tablet Take 10 mg by mouth daily. Pt states she takes 1/4 of the tablet by mouth daily    . omeprazole (PRILOSEC) 20 MG capsule Take 1 capsule (20 mg total) by mouth daily. 30 capsule 5  . atorvastatin (LIPITOR) 40 MG tablet Take 1 tablet (40 mg total) by mouth daily. (Patient not taking: Reported on 06/22/2015) 30 tablet 11  . carvedilol (COREG) 12.5 MG tablet Take 1 tablet (12.5 mg total) by mouth 2 (two) times daily with a meal. (Patient not taking: Reported on 06/22/2015) 180 tablet 1  . diclofenac (VOLTAREN) 75 MG EC tablet Take 1 tablet (75 mg total) by mouth 2 (two) times daily. (Patient not taking: Reported on 06/21/2015) 60 tablet 0  . donepezil (ARICEPT) 5 MG tablet Take 2 tablets (10 mg total) by mouth daily. (Patient not taking: Reported on 06/22/2015) 60 tablet 5  . nitroGLYCERIN (NITROSTAT) 0.4 MG SL tablet Place 1 tablet (0.4 mg total) under the tongue every 5 (five) minutes as needed for chest pain. (Patient not taking: Reported on 06/22/2015) 15 tablet 2  . [DISCONTINUED] cetirizine (ZYRTEC) 10 MG tablet Take 10 mg by  mouth daily.      . [DISCONTINUED] losartan-hydrochlorothiazide (HYZAAR) 100-25 MG per tablet Take 1 tablet by mouth daily.       No current facility-administered medications for this visit.    Allergies:   Review of patient's allergies indicates no known allergies.    Social History:  The patient  reports that she has never smoked. She has never used smokeless tobacco. She reports that she does not drink alcohol or use illicit drugs.   Family History:  The patient's family history is not on file.    ROS:  General:no colds or fevers, no weight changes CV:see HPI PUL:see HPI GI:no diarrhea constipation or melena, no indigestion GU:no hematuria, no dysuria MS:no joint pain, no claudication Neuro:no syncope, no lightheadedness Endo:no diabetes, no thyroid disease  Wt Readings from Last  3 Encounters:  06/22/15 159 lb (72.122 kg)  05/12/15 154 lb (69.854 kg)  04/21/15 153 lb 12.8 oz (69.763 kg)     PHYSICAL EXAM: VS:  BP 140/60 mmHg  Pulse 78  Resp 18  Ht 5\' 2"  (1.575 m)  Wt 159 lb (72.122 kg)  BMI 29.07 kg/m2 , BMI Body mass index is 29.07 kg/(m^2). General:Pleasant affect, NAD Skin:Warm and dry, brisk capillary refill HEENT:normocephalic, sclera clear, mucus membranes moist Heart:S1S2 RRR without murmur, gallup, rub or click Lungs:clear without rales, rhonchi, or wheezes WNU:UVOZ, non tender, + BS, do not palpate liver spleen or masses Ext:no lower ext edema, 2+ pedal pulses, 2+ radial pulses Neuro:alert and oriented, MAE, follows commands, + facial symmetry    EKG:  EKG is NOT ordered today.    Recent Labs: 03/17/2015: ALT 13; BUN 18; Creat 1.07; Potassium 3.6; Sodium 141; TSH 1.430    Lipid Panel    Component Value Date/Time   CHOL 128 07/06/2014 1222   TRIG 168* 07/06/2014 1222   HDL 38* 07/06/2014 1222   CHOLHDL 3.4 07/06/2014 1222   VLDL 34 07/06/2014 1222   LDLCALC 56 07/06/2014 1222       Other studies Reviewed: Additional studies/ records that were reviewed today include: previous notes.   ASSESSMENT AND PLAN:  1.  Uncontrolled HTN resume coreg at 6.25 BID  X 2 weeks then 12.5 BID her HCTZ was stopped due to low K+ but pt thought she should stop coreg as well.  She will follow up in BP clinic.   2. CAD  No chest pain.   Current medicines are reviewed with the patient today.  The patient Has no concerns regarding medicines.  The following changes have been made:  See above Labs/ tests ordered today include:see above  Disposition:   FU:  see above  Signed, Isaiah Serge, NP  06/22/2015 3:27 PM    [facility] [address], [place], [place], [place] Phone: [phone]; Fax: [phone]

## 2015-06-24 ENCOUNTER — Emergency Department (HOSPITAL_COMMUNITY)
Admission: EM | Admit: 2015-06-24 | Discharge: 2015-06-24 | Disposition: A | Discharge status: Home / Self Care | Payer: No Typology Code available for payment source | Attending: Emergency Medicine | Admitting: Emergency Medicine

## 2015-06-24 ENCOUNTER — Encounter (HOSPITAL_COMMUNITY): Payer: Self-pay | Admitting: Vascular Surgery

## 2015-06-24 DIAGNOSIS — Z862 Personal history of diseases of the blood and blood-forming organs and certain disorders involving the immune mechanism: Secondary | ICD-10-CM | POA: Insufficient documentation

## 2015-06-24 DIAGNOSIS — E041 Nontoxic single thyroid nodule: Secondary | ICD-10-CM | POA: Insufficient documentation

## 2015-06-24 DIAGNOSIS — E785 Hyperlipidemia, unspecified: Secondary | ICD-10-CM | POA: Insufficient documentation

## 2015-06-24 DIAGNOSIS — M1711 Unilateral primary osteoarthritis, right knee: Secondary | ICD-10-CM

## 2015-06-24 DIAGNOSIS — I10 Essential (primary) hypertension: Secondary | ICD-10-CM | POA: Insufficient documentation

## 2015-06-24 DIAGNOSIS — Z7982 Long term (current) use of aspirin: Secondary | ICD-10-CM | POA: Insufficient documentation

## 2015-06-24 DIAGNOSIS — Z7902 Long term (current) use of antithrombotics/antiplatelets: Secondary | ICD-10-CM | POA: Insufficient documentation

## 2015-06-24 DIAGNOSIS — M25461 Effusion, right knee: Secondary | ICD-10-CM

## 2015-06-24 DIAGNOSIS — K219 Gastro-esophageal reflux disease without esophagitis: Secondary | ICD-10-CM | POA: Insufficient documentation

## 2015-06-24 DIAGNOSIS — Z79899 Other long term (current) drug therapy: Secondary | ICD-10-CM | POA: Insufficient documentation

## 2015-06-24 MED ORDER — HYDROCODONE-ACETAMINOPHEN 5-325 MG PO TABS: 1.0000 | ORAL_TABLET | ORAL | Status: DC | PRN

## 2015-06-24 MED ORDER — DOCUSATE SODIUM 100 MG PO CAPS: 100.0000 mg | ORAL_CAPSULE | Freq: Every day | ORAL | Status: DC

## 2015-06-24 NOTE — ED Provider Notes (Addendum)
CSN: [id number]     Arrival date & time 06/24/15  1538 History   First MD Initiated Contact with Patient 06/24/15 1605     Chief Complaint  Patient presents with  . Leg Pain     (Consider location/radiation/quality/duration/timing/severity/associated sxs/prior Treatment) HPI Patient has had problems with knee pain for several months. She used to have problems with the left knee and in the past has gotten injections. Now the pain has moved to the right knee. It has been uncomfortable for several months. Over the past week to several days however the pain is gotten worse with weightbearing and there has been some swelling. She reports it feels almost a little bit itchy and like there something in her knee. She then experiences pain that radiates up the side of her thigh. This is been worse with weightbearing. There has not been redness or fever or chills. The knee feels stiff when she walks on it and it feels as though it might give out. Her doctors given her Voltaren for the knee pain but it really hasn't helped much and is not controlling her pain. Past Medical History  Diagnosis Date  . Hypertension   . GERD (gastroesophageal reflux disease)   . HYPERLIPIDEMIA   . THYROID NODULE   . PULMONARY NODULE   . Edema   . CHEST PAIN UNSPECIFIED   . ANEMIA, CHRONIC   . Hx of cardiovascular stress test     Lexiscan Myoview (11/14):  No ischemia, EF 68% (normal study)   Past Surgical History  Procedure Laterality Date  . Vaginal hysterectomy  1989  . Tubal ligation    . Left heart catheterization with coronary angiogram N/A 09/16/2011    Procedure: LEFT HEART CATHETERIZATION WITH CORONARY ANGIOGRAM;  Surgeon: [name], MD;  Location: [facility] CATH LAB;  Service: Cardiovascular;  Laterality: N/A;   No family history on file. Social History  Substance Use Topics  . Smoking status: Never Smoker   . Smokeless tobacco: Never Used  . Alcohol Use: No   OB History    No data available      Review of Systems  10 Systems reviewed and are negative for acute change except as noted in the HPI.   Allergies  Review of patient's allergies indicates no known allergies.  Home Medications   Prior to Admission medications   Medication Sig Start Date End Date Taking? Authorizing Provider  amLODipine (NORVASC) 10 MG tablet Take 1 tablet (10 mg total) by mouth daily. 05/12/15   Boykin Nearing, MD  aspirin 81 MG tablet Take 81 mg by mouth daily.    Historical Provider, MD  atorvastatin (LIPITOR) 40 MG tablet Take 1 tablet (40 mg total) by mouth daily. Patient not taking: Reported on 06/22/2015 04/21/15   Boykin Nearing, MD  carvedilol (COREG) 12.5 MG tablet Take 1/2 tablet by mouth twice a day for 2 weeks then take 1 tablet by mouth twice a day 06/22/15   Isaiah Serge, NP  clopidogrel (PLAVIX) 75 MG tablet Take 1 tablet (75 mg total) by mouth daily. 04/28/14   Josue Hector, MD  diclofenac (VOLTAREN) 75 MG EC tablet Take 1 tablet (75 mg total) by mouth 2 (two) times daily. Patient not taking: Reported on 06/21/2015 05/12/15   Boykin Nearing, MD  docusate sodium (COLACE) 100 MG capsule Take 1 capsule (100 mg total) by mouth daily. To prevent constipation while taking narcotic pain reliever like Vicodin (hydrocodone). 06/24/15   Charlesetta Shanks, MD  donepezil (  ARICEPT) 5 MG tablet Take 2 tablets (10 mg total) by mouth daily. Patient not taking: Reported on 06/22/2015 04/21/15   Boykin Nearing, MD  gabapentin (NEURONTIN) 300 MG capsule Take 1 capsule (300 mg total) by mouth at bedtime. 04/21/15   Boykin Nearing, MD  HYDROcodone-acetaminophen (NORCO/VICODIN) 5-325 MG tablet Take 1-2 tablets by mouth every 4 (four) hours as needed for moderate pain or severe pain. 06/24/15   Charlesetta Shanks, MD  levothyroxine (SYNTHROID) 25 MCG tablet TAKE 1 TABLET BY MOUTH ONCE DAILY 04/21/15   Boykin Nearing, MD  loratadine (CLARITIN) 10 MG tablet Take 1 tablet (10 mg total) by mouth daily. 04/21/15   Boykin Nearing, MD  meclizine (ANTIVERT) 25 MG tablet Take 1 tablet (25 mg total) by mouth 3 (three) times daily as needed for dizziness or nausea. 04/21/15   Josalyn Funches, MD  memantine (NAMENDA) 10 MG tablet Take 10 mg by mouth daily. Pt states she takes 1/4 of the tablet by mouth daily    Historical Provider, MD  nitroGLYCERIN (NITROSTAT) 0.4 MG SL tablet Place 1 tablet (0.4 mg total) under the tongue every 5 (five) minutes as needed for chest pain. Patient not taking: Reported on 06/22/2015 07/08/13   Thurnell Lose, MD  omeprazole (PRILOSEC) 20 MG capsule Take 1 capsule (20 mg total) by mouth daily. 04/21/15   Josalyn Funches, MD   BP 144/66 mmHg  Pulse 81  Temp(Src) 98.1 F (36.7 C) (Oral)  Resp 16  SpO2 97% Physical Exam  Constitutional: She is oriented to person, place, and time. She appears well-developed and well-nourished. No distress.  HENT:  Head: Normocephalic and atraumatic.  Eyes: EOM are normal.  Cardiovascular: Normal rate, regular rhythm, normal heart sounds and intact distal pulses.   Pulmonary/Chest: Effort normal and breath sounds normal.  Abdominal: Soft. Bowel sounds are normal. She exhibits no distension. There is no tenderness.  Musculoskeletal:  Patient has mild effusion of the right knee. Some of the patellar contours are preserved and effusion is noticeable in the lateral suprapatellar region. There is no erythema or warmth to the joint. The joints is large and arthritic. She endorses some tenderness to palpation along the tibial joint line anteriorly and the patellar tendon region. She can perform range of motion with extension and flexion without severe pain. Her left knee is arthritic and enlarged without any erythema or effusion. Both lower legs are normal without edema or calf tenderness. Feet are good condition without wounds or swelling. She is neurovascularly intact.  Neurological: She is alert and oriented to person, place, and time. She has normal strength.  Coordination normal. GCS eye subscore is 4. GCS verbal subscore is 5. GCS motor subscore is 6.  Skin: Skin is warm, dry and intact.  Psychiatric: She has a normal mood and affect.          ED Course  Procedures (including critical care time) Labs Review Labs Reviewed - No data to display  Imaging Review No results found. I have personally reviewed and evaluated these images and lab results as part of my medical decision-making.   EKG Interpretation None      MDM   Final diagnoses:  Knee effusion, right  Primary osteoarthritis of right knee   Patient's history and physical examination are consistent with degenerative joint disease. She has developed a very mild effusion with intact range of motion and still appreciable joint contours. At this time I do not feel that joint aspiration is likely to be beneficial  for the patient. I did explain to her that she may gets temporary relief however the effusion may reaccumulate within less than a day. As well I explained that with a fairly small effusion there is a possibility of not obtaining much or any fluid return and performing a procedure with standard risks of infection or bleeding. At this time I feel most appropriate management will be elevation and icing. The patient can use a walker for pain if needed and follow up with orthopedics.    Charlesetta Shanks, MD 06/24/15 1656  Charlesetta Shanks, MD 06/24/15 [id number]

## 2015-06-24 NOTE — Discharge Instructions (Signed)
Knee Effusion Knee effusion means that you have excess fluid in your knee joint. This can cause pain and swelling in your knee. This may make your knee more difficult to bend and move. That is because there is increased pain and pressure in the joint. If there is fluid in your knee, it often means that something is wrong inside your knee, such as severe arthritis, abnormal inflammation, or an infection. Another common cause of knee effusion is an injury to the knee muscles, ligaments, or cartilage. HOME CARE INSTRUCTIONS  Use crutches as directed by your health care provider.  Wear a knee brace as directed by your health care provider.  Apply ice to the swollen area:  Put ice in a plastic bag.  Place a towel between your skin and the bag.  Leave the ice on for 20 minutes, 2-3 times per day.  Keep your knee raised (elevated) when you are sitting or lying down.  Take medicines only as directed by your health care provider.  Do any rehabilitation or strengthening exercises as directed by your health care provider.  Rest your knee as directed by your health care provider. You may start doing your normal activities again when your health care provider approves.   Keep all follow-up visits as directed by your health care provider. This is important. SEEK MEDICAL CARE IF:  You have ongoing (persistent) pain in your knee. SEEK IMMEDIATE MEDICAL CARE IF:  You have increased swelling or redness of your knee.  You have severe pain in your knee.  You have a fever.   This information is not intended to replace advice given to you by your health care provider. Make sure you discuss any questions you have with your health care provider.   Document Released: 11/16/2003 Document Revised: 09/16/2014 Document Reviewed: 04/11/2014 Elsevier Interactive Patient Education 2016 Elsevier Inc. Osteoarthritis Osteoarthritis is a disease that causes soreness and inflammation of a joint. It occurs  when the cartilage at the affected joint wears down. Cartilage acts as a cushion, covering the ends of bones where they meet to form a joint. Osteoarthritis is the most common form of arthritis. It often occurs in older people. The joints affected most often by this condition include those in the:  Ends of the fingers.  Thumbs.  Neck.  Lower back.  Knees.  Hips. CAUSES  Over time, the cartilage that covers the ends of bones begins to wear away. This causes bone to rub on bone, producing pain and stiffness in the affected joints.  RISK FACTORS Certain factors can increase your chances of having osteoarthritis, including:  Older age.  Excessive body weight.  Overuse of joints.  Previous joint injury. SIGNS AND SYMPTOMS   Pain, swelling, and stiffness in the joint.  Over time, the joint may lose its normal shape.  Small deposits of bone (osteophytes) may grow on the edges of the joint.  Bits of bone or cartilage can break off and float inside the joint space. This may cause more pain and damage. DIAGNOSIS  Your health care provider will do a physical exam and ask about your symptoms. Various tests may be ordered, such as:  X-rays of the affected joint.  Blood tests to rule out other types of arthritis. Additional tests may be used to diagnose your condition. TREATMENT  Goals of treatment are to control pain and improve joint function. Treatment plans may include:  A prescribed exercise program that allows for rest and joint relief.  A weight control  plan.  Pain relief techniques, such as:  Properly applied heat and cold.  Electric pulses delivered to nerve endings under the skin (transcutaneous electrical nerve stimulation [TENS]).  Massage.  Certain nutritional supplements.  Medicines to control pain, such as:  Acetaminophen.  Nonsteroidal anti-inflammatory drugs (NSAIDs), such as naproxen.  Narcotic or central-acting agents, such as  tramadol.  Corticosteroids. These can be given orally or as an injection.  Surgery to reposition the bones and relieve pain (osteotomy) or to remove loose pieces of bone and cartilage. Joint replacement may be needed in advanced states of osteoarthritis. HOME CARE INSTRUCTIONS   Take medicines only as directed by your health care provider.  Maintain a healthy weight. Follow your health care provider's instructions for weight control. This may include dietary instructions.  Exercise as directed. Your health care provider can recommend specific types of exercise. These may include:  Strengthening exercises. These are done to strengthen the muscles that support joints affected by arthritis. They can be performed with weights or with exercise bands to add resistance.  Aerobic activities. These are exercises, such as brisk walking or low-impact aerobics, that get your heart pumping.  Range-of-motion activities. These keep your joints limber.  Balance and agility exercises. These help you maintain daily living skills.  Rest your affected joints as directed by your health care provider.  Keep all follow-up visits as directed by your health care provider. SEEK MEDICAL CARE IF:   Your skin turns red.  You develop a rash in addition to your joint pain.  You have worsening joint pain.  You have a fever along with joint or muscle aches. SEEK IMMEDIATE MEDICAL CARE IF:  You have a significant loss of weight or appetite.  You have night sweats. [place] of Arthritis and Musculoskeletal and Skin Diseases: www.niams.SouthExposed.es  [employer] on Aging: http://kim-miller.com/  American College of Rheumatology: www.rheumatology.org   This information is not intended to replace advice given to you by your health care provider. Make sure you discuss any questions you have with your health care provider.   Document Released: 08/26/2005 Document Revised:  09/16/2014 Document Reviewed: 05/03/2013 Elsevier Interactive Patient Education [employer].

## 2015-06-24 NOTE — ED Notes (Signed)
Pt reports to the ED for eval of right leg pain since July. She reports the pain started in her hip and is radiating down. Reports it is getting worse. Also has an itching/burning sensation. Also states her right knee is swollen. CMS intact. Denies any injury. Cannot bear weight on leg and states it sometimes feels weak. Pt A&Ox4, resp e/u, and skin warm and dry.

## 2015-06-29 ENCOUNTER — Encounter: Payer: Self-pay | Admitting: Family Medicine

## 2015-06-29 ENCOUNTER — Ambulatory Visit: Payer: No Typology Code available for payment source | Admitting: Pharmacist

## 2015-06-29 ENCOUNTER — Ambulatory Visit: Payer: No Typology Code available for payment source | Admitting: Family Medicine

## 2015-06-29 ENCOUNTER — Ambulatory Visit: Payer: No Typology Code available for payment source | Attending: Family Medicine | Admitting: Family Medicine

## 2015-06-29 ENCOUNTER — Telehealth: Payer: Self-pay | Admitting: Family Medicine

## 2015-06-29 VITALS — BP 126/74 | HR 79 | Temp 98.2°F | Resp 16 | Ht 62.0 in | Wt 155.0 lb

## 2015-06-29 DIAGNOSIS — E663 Overweight: Secondary | ICD-10-CM | POA: Insufficient documentation

## 2015-06-29 DIAGNOSIS — M25461 Effusion, right knee: Secondary | ICD-10-CM | POA: Insufficient documentation

## 2015-06-29 DIAGNOSIS — M25561 Pain in right knee: Secondary | ICD-10-CM | POA: Insufficient documentation

## 2015-06-29 DIAGNOSIS — Z7982 Long term (current) use of aspirin: Secondary | ICD-10-CM | POA: Insufficient documentation

## 2015-06-29 DIAGNOSIS — Z79899 Other long term (current) drug therapy: Secondary | ICD-10-CM | POA: Insufficient documentation

## 2015-06-29 MED ORDER — METHYLPREDNISOLONE ACETATE 40 MG/ML IJ SUSP
40.0000 mg | Freq: Once | INTRAMUSCULAR | Status: AC
Start: 2015-06-29 — End: 2015-06-29
  Administered 2015-06-29: 40 mg via INTRAMUSCULAR

## 2015-06-29 NOTE — Assessment & Plan Note (Signed)
A: R medial knee pain and swelling suspect DJD +/- soft tissue injury P: Steroid shot done  Referral to [facility] and PT Advised patient walk with a cane in R hand while R knee is bothering her Advised low impact exercise like water aerobics

## 2015-06-29 NOTE — Progress Notes (Signed)
Patient ID: Wendy Petty, female   DOB: 1941/03/21, 74 y.o.   MRN: [id number]   Subjective:  Patient ID: Wendy Petty, female    DOB: 08-04-1941  Age: 74 y.o. MRN: [id number]  CC: Knee Pain  Spanish interpreter used  HPI Dianey Orlin Hilding presents for   1.  B/l  knee pain: knee pain started on the L. Now on the R.  Patient went to ED on 06/24/15 and was diagnosed with R knee effusion. She has known DJD on the L from b/l standing x-ray done on 04/24/2015. Corticosteroid injection done on the L improved pain. Her R knee pain is currently 10/10. Medial and inferior joint pain. Pain is worse with standing. There is joint laxity. No locking or popping. No recent injury. She is not physically active.   Social History  Substance Use Topics  . Smoking status: Never Smoker   . Smokeless tobacco: Never Used  . Alcohol Use: No    Outpatient Prescriptions Prior to Visit  Medication Sig Dispense Refill  . amLODipine (NORVASC) 10 MG tablet Take 1 tablet (10 mg total) by mouth daily. 90 tablet 0  . aspirin 81 MG tablet Take 81 mg by mouth daily.    Marland Kitchen atorvastatin (LIPITOR) 40 MG tablet Take 1 tablet (40 mg total) by mouth daily. 30 tablet 11  . carvedilol (COREG) 12.5 MG tablet Take 1/2 tablet by mouth twice a day for 2 weeks then take 1 tablet by mouth twice a day 180 tablet 1  . clopidogrel (PLAVIX) 75 MG tablet Take 1 tablet (75 mg total) by mouth daily. 90 tablet 3  . diclofenac (VOLTAREN) 75 MG EC tablet Take 1 tablet (75 mg total) by mouth 2 (two) times daily. 60 tablet 0  . docusate sodium (COLACE) 100 MG capsule Take 1 capsule (100 mg total) by mouth daily. To prevent constipation while taking narcotic pain reliever like Vicodin (hydrocodone). 30 capsule 0  . donepezil (ARICEPT) 5 MG tablet Take 2 tablets (10 mg total) by mouth daily. 60 tablet 5  . gabapentin (NEURONTIN) 300 MG capsule Take 1 capsule (300 mg total) by mouth at bedtime. 30 capsule 5  .  HYDROcodone-acetaminophen (NORCO/VICODIN) 5-325 MG tablet Take 1-2 tablets by mouth every 4 (four) hours as needed for moderate pain or severe pain. 20 tablet 0  . levothyroxine (SYNTHROID) 25 MCG tablet TAKE 1 TABLET BY MOUTH ONCE DAILY 120 tablet 0  . loratadine (CLARITIN) 10 MG tablet Take 1 tablet (10 mg total) by mouth daily. 30 tablet 5  . meclizine (ANTIVERT) 25 MG tablet Take 1 tablet (25 mg total) by mouth 3 (three) times daily as needed for dizziness or nausea. 60 tablet 1  . memantine (NAMENDA) 10 MG tablet Take 10 mg by mouth daily. Pt states she takes 1/4 of the tablet by mouth daily    . nitroGLYCERIN (NITROSTAT) 0.4 MG SL tablet Place 1 tablet (0.4 mg total) under the tongue every 5 (five) minutes as needed for chest pain. 15 tablet 2  . omeprazole (PRILOSEC) 20 MG capsule Take 1 capsule (20 mg total) by mouth daily. 30 capsule 5   No facility-administered medications prior to visit.    ROS Review of Systems  Constitutional: Negative for fever and chills.  Eyes: Negative for visual disturbance.  Respiratory: Negative for shortness of breath.   Cardiovascular: Negative for chest pain.  Gastrointestinal: Negative for abdominal pain and blood in stool.  Musculoskeletal: Positive for joint swelling, arthralgias and gait problem.  Negative for back pain.  Skin: Negative for rash.  Allergic/Immunologic: Negative for immunocompromised state.  Hematological: Negative for adenopathy. Does not bruise/bleed easily.  Psychiatric/Behavioral: Negative for suicidal ideas and dysphoric mood.    Objective:  BP 126/74 mmHg  Pulse 79  Temp(Src) 98.2 F (36.8 C) (Oral)  Resp 16  Ht 5\' 2"  (1.575 m)  Wt 155 lb (70.308 kg)  BMI 28.34 kg/m2  SpO2 98%  BP/Weight 06/29/2015 06/24/2015 54/56/2563  Systolic BP 893 734 287  Diastolic BP 74 66 60  Wt. (Lbs) 155 - 159  BMI 28.34 - 29.07   Physical Exam  Constitutional: She is oriented to person, place, and time. She appears well-developed  and well-nourished. No distress.  HENT:  Head: Normocephalic and atraumatic.  Pulmonary/Chest: Effort normal.  Musculoskeletal: She exhibits no edema.       Right knee: She exhibits swelling and effusion. She exhibits normal range of motion, no ecchymosis, no deformity, no laceration, no erythema and normal alignment.  Neurological: She is alert and oriented to person, place, and time.  Skin: Skin is warm and dry. No rash noted.  Psychiatric: She has a normal mood and affect.   After obtaining informed consent and cleaning the skin using iodine and alcohol a  steroid injection was performed at R medial knee using 1% plain Lidocaine and 40 mg of Depo Medrol. This was well tolerated Assessment & Plan:   Problem List Items Addressed This Visit    Right medial knee pain - Primary    A: R medial knee pain and swelling suspect DJD +/- soft tissue injury P: Steroid shot done  Referral to [facility] and PT Advised patient walk with a cane in R hand while R knee is bothering her Advised low impact exercise like water aerobics       Relevant Medications   methylPREDNISolone acetate (DEPO-MEDROL) injection 40 mg (Completed)   Other Relevant Orders   Ambulatory referral to Sports Medicine   Ambulatory referral to Physical Therapy      No orders of the defined types were placed in this encounter.    Follow-up: No Follow-up on file.   Boykin Nearing MD

## 2015-06-29 NOTE — Patient Instructions (Addendum)
Wendy Petty was seen today for knee pain.  Diagnoses and all orders for this visit:  Right medial knee pain -     methylPREDNISolone acetate (DEPO-MEDROL) injection 40 mg; Inject 1 mL (40 mg total) into the muscle once. -     Ambulatory referral to Sports Medicine -     Ambulatory referral to Physical Therapy   Purchase a cane to use in R hand for support  You have received a shot of steroid in your joint today. Rest and ice knee today. Regular activity tomorrow. Look out for redness, swelling, fever,severe pain in joint and call if you experience these symptoms.   F/u in 6 weeks for R knee pain   Dr. Adrian Blackwater

## 2015-06-29 NOTE — Progress Notes (Signed)
C/C pain on rt foot x 1 month Pain scale 10 Unable to put pressure on leg to walk  No hx tobacco

## 2015-06-29 NOTE — Telephone Encounter (Signed)
Patient would like to change pcp with dr Adrian Blackwater to another provider. Please follow up with pt if she is able to see another doctor and call patient to schedule her appt. She is currently having a lot of knee pain. Thank you. She was recently in the ED on Saturday. For the moment, I will attempt to schedule her with dr Jarold Song as a hfu.

## 2015-06-29 NOTE — Telephone Encounter (Signed)
Patient is welcome to switch providers

## 2015-07-06 ENCOUNTER — Inpatient Hospital Stay: Payer: No Typology Code available for payment source | Admitting: Family Medicine

## 2015-07-12 ENCOUNTER — Ambulatory Visit: Payer: No Typology Code available for payment source | Attending: Family Medicine

## 2015-07-13 ENCOUNTER — Ambulatory Visit: Payer: No Typology Code available for payment source | Attending: Family Medicine | Admitting: Physical Therapy

## 2015-07-13 DIAGNOSIS — R262 Difficulty in walking, not elsewhere classified: Secondary | ICD-10-CM | POA: Insufficient documentation

## 2015-07-13 DIAGNOSIS — M79662 Pain in left lower leg: Secondary | ICD-10-CM | POA: Insufficient documentation

## 2015-07-13 DIAGNOSIS — M545 Low back pain, unspecified: Secondary | ICD-10-CM

## 2015-07-13 DIAGNOSIS — M25561 Pain in right knee: Secondary | ICD-10-CM | POA: Insufficient documentation

## 2015-07-13 DIAGNOSIS — M25562 Pain in left knee: Secondary | ICD-10-CM | POA: Insufficient documentation

## 2015-07-13 NOTE — Therapy (Signed)
[place], [place], [address] Phone: [phone]   Fax:  [phone]  Physical Therapy Evaluation  Patient Details  Name: Wendy Petty MRN: [id number] Date of Birth: Apr 11, 1941 Referring Provider: Adrian Blackwater  Encounter Date: 07/13/2015      PT End of Session - 07/13/15 1244    Visit Number 1   Number of Visits 16   PT Start Time 1150   PT Stop Time 1252   PT Time Calculation (min) 62 min   Activity Tolerance Patient tolerated treatment well   Behavior During Therapy [facility] for tasks assessed/performed      Past Medical History  Diagnosis Date  . Hypertension   . GERD (gastroesophageal reflux disease)   . HYPERLIPIDEMIA   . THYROID NODULE   . PULMONARY NODULE   . Edema   . CHEST PAIN UNSPECIFIED   . ANEMIA, CHRONIC   . Hx of cardiovascular stress test     Lexiscan Myoview (11/14):  No ischemia, EF 68% (normal study)    Past Surgical History  Procedure Laterality Date  . Vaginal hysterectomy  1989  . Tubal ligation    . Left heart catheterization with coronary angiogram N/A 09/16/2011    Procedure: LEFT HEART CATHETERIZATION WITH CORONARY ANGIOGRAM;  Surgeon: [name], MD;  Location: [facility] CATH LAB;  Service: Cardiovascular;  Laterality: N/A;    There were no vitals filed for this visit.  Visit Diagnosis:  Arthralgia of both knees  Difficulty walking up stairs  Difficulty walking  Bilateral low back pain without sciatica      Subjective Assessment - 07/13/15 1156    Subjective Patient with bilateral knee pain.  Pain began with L knee and progressed to Rt. knee.  Knee pain began 4 mos ago.  She describes pain, weakness  and diff walking. She is unable to do any housework at all, ADLs are very challenging.    She has had injections in both knees but it did not help on Rt.     Patient is accompained by: Family member;Interpreter   Limitations Standing;Walking;House hold activities    How long can you stand comfortably? not comfortable also with back pain    How long can you walk comfortably? not comfortable also with back pain    Diagnostic tests XR with mild degen changes   Currently in Pain? Yes   Pain Score 8    Pain Location Knee   Pain Orientation Right;Left   Pain Type Chronic pain   Pain Radiating Towards states pain is up thighs and into low back.     Pain Onset More than a month ago   Pain Frequency Constant   Aggravating Factors  standing, walking   Pain Relieving Factors Tylenol, stting   Effect of Pain on Daily Activities cannot walk   Multiple Pain Sites Yes   Pain Score 8   Pain Location Back   Pain Orientation Lower   Pain Descriptors / Indicators Burning   Pain Type Chronic pain   Pain Onset More than a month ago   Pain Frequency Intermittent   Aggravating Factors  walking    Pain Relieving Factors Tylenol, sitting but still has heat in back    Effect of Pain on Daily Activities walking is diff.            [facility] PT Assessment - 07/13/15 1207    Assessment   Medical Diagnosis knee pain    Referring Provider Funches  Onset Date/Surgical Date --  4 mos   Prior Therapy No   Precautions   Precautions None   Restrictions   Weight Bearing Restrictions No   Balance Screen   Has the patient fallen in the past 6 months No   Has the patient had a decrease in activity level because of a fear of falling?  Yes  due to pain    Is the patient reluctant to leave their home because of a fear of falling?  Yes   Home Environment   Living Environment Private residence   Type of [place] Access Level entry   Home Layout Two level   Prior Function   Level of Independence Independent   Cognition   Overall Cognitive Status Within Functional Limits for tasks assessed   Observation/Other Assessments   Focus on Therapeutic Outcomes (FOTO)  74%   Observation/Other Assessments-Edema    Edema --  knees appear symmetical mild edema but not  measured   Sensation   Light Touch Appears Intact   Coordination   Gross Motor Movements are Fluid and Coordinated Not tested   Functional Tests   Functional tests Squat   Squat   Comments pain   Posture/Postural Control   Posture/Postural Control No significant limitations   AROM   Right Knee Extension 0   Right Knee Flexion 123   Left Knee Extension 0   Left Knee Flexion 116   Strength   Right Hip Flexion 3+/5   Right Hip ABduction 4-/5   Left Hip Flexion 3+/5   Left Hip ABduction 4-/5   Right Knee Flexion 4/5   Right Knee Extension 4/5   Left Knee Flexion 4/5   Left Knee Extension 4/5   Palpation   Patella mobility painful    Palpation comment pain with palpation to low lumbar spine, into gluteals, hamstrings.  Medial Rt. knee very painful and just distal to patella   Special Tests    Special Tests Knee Special Tests   Knee Special tests  --  not reliable, Rt. pos Lachmans and med stress on Rt.    Ambulation/Gait   Ambulation Distance (Feet) 300 Feet   Assistive device Straight cane   Gait Pattern Step-to pattern;Decreased stride length;Antalgic   Ambulation Surface Level;Indoor   Gait velocity slow, not measured             PT Education - 07/13/15 1243    Education provided Yes   Education Details PT/POC, HEP and arthritis   Person(s) Educated Patient;Child(ren)   Methods Explanation;Handout   Comprehension Returned demonstration;Verbalized understanding    MHP to bilateral anterior knees in supine while finishing FOTO. 10 min       PT Short Term Goals - 07/13/15 1435    PT SHORT TERM GOAL #1   Title Patient will be I with initial HEP    Time 4   Period Weeks   Status New   PT SHORT TERM GOAL #2   Title Pt will be able to tolerate 10 min of standing for housework (light) with min/mod increase in pain (knee/back)   Time 4   Period Weeks   Status New   PT SHORT TERM GOAL #3   Title Pt will be able to step up with Rt. LE onto step with improved  confidence, less pain.    Time 4   Period Weeks   Status New   PT SHORT TERM GOAL #4   Title Pt will be able  to use 2-3 pain mgmt strategies for home use.    Time 4   Period Weeks   Status New           PT Long Term Goals - 07/13/15 1437    PT LONG TERM GOAL #1   Title pt will be I with more advanced HEP as of last visit.    Time 8   Period Weeks   Status New   PT LONG TERM GOAL #2   Title Pt will return to doing ADLs and housework without limits of pain most days of the week.    Time 8   Period Weeks   Status New   PT LONG TERM GOAL #3   Title Pt will be able to negotiate stairs in community without increase in pain using rails or device.    Time 8   Period Weeks   Status New   PT LONG TERM GOAL #4   Title Pt will be able to go without cane for communtiy gait (up to 30 min) and min increase in knee/back pain.    Time 8   Period Weeks   Status New   PT LONG TERM GOAL #5   Title FOTO score will improve to no more than 45% limited    Time 8   Period Weeks               Plan - 07/13/15 1431    Clinical Impression Statement Patient complains of weakness and pain in bilateral knees.  She also describes increase in back pain which she did not ave prior to knee pain. Weakness in symmetrical, no change in sensation, Gr Toe ext is normal. Added at end of session that prior to knee pain, she had nightly muscle cramps in bilateral LEs and back.  She likely has changed the way she walks due to pain and may have stirred up a latent back issue.  Has many quesions regarding prognosis and artthritis in general.    Pt will benefit from skilled therapeutic intervention in order to improve on the following deficits Abnormal gait;Decreased strength;Increased muscle spasms;Improper body mechanics;Difficulty walking;Decreased knowledge of use of DME;Decreased activity tolerance;Decreased mobility;Impaired flexibility;Decreased range of motion;Decreased balance;Pain;Obesity;Impaired UE  functional use;Increased edema;Decreased endurance;Increased fascial restricitons   Rehab Potential Good   PT Frequency 2x / week   PT Duration 8 weeks   PT Treatment/Interventions Gait training;Stair training;DME Instruction;ADLs/Self Care Home Management;Vasopneumatic Device;Manual techniques;Functional mobility training;Cryotherapy;Therapeutic activities;Therapeutic exercise;Electrical Stimulation;Iontophoresis 4mg /ml Dexamethasone;Balance training;Neuromuscular re-education;Moist Heat;Ultrasound;Parrafin;Patient/family education;Dry needling;Taping;Passive range of motion   PT Next Visit Plan review level 1 knee and try bike/NuStep, modalities ([place] )   PT Home Exercise Plan level 1 knee   Consulted and Agree with Plan of Care Patient;Family member/caregiver   Family Member Consulted daughter         Problem List Patient Active Problem List   Diagnosis Date Noted  . Right medial knee pain 06/29/2015  . Overweight (BMI 25.0-29.9) 06/29/2015  . Chronic pain of left knee 04/21/2015  . Memory deficit 11/28/2014  . Coronary atherosclerosis of native coronary artery 06/24/2013  . History of TIAs 06/24/2013  . GERD (gastroesophageal reflux disease) 06/24/2013  . Depression 12/31/2012  . CHEST PAIN UNSPECIFIED 02/17/2009  . THYROID NODULE 02/14/2009  . Elevated lipids 02/14/2009  . ANEMIA, CHRONIC 02/14/2009  . Essential hypertension 02/14/2009  . PULMONARY NODULE 02/14/2009    Jakub Debold 07/13/2015, 2:45 PM  [facility] [address] [place], [place], [address] Phone:  [phone]   Fax:  [phone]  Name: Wretha Laris MRN: [id number] Date of Birth: [birth year]  Raeford Razor, PT 07/13/2015 2:46 PM Phone: [phone] Fax: [phone]

## 2015-07-13 NOTE — Patient Instructions (Signed)
Quad Set    Contraiga lentamente el msculo anterior del muslo de la pierna estirada mientras cuenta en voz alta hasta __10-20__. Repita con la pierna opuesta. Repita la serie __1-2__ veces. Haga ___2_ sesiones por da.  [email or web address]   Copyright  VHI. All rights reserved.  [employer]    estire (extienda) la pierna operada y trate de Theatre manager la _5    segundos.  Repita __10-20__ veces. Haga __2__ sesiones por da.  [email or web address].[email or web address]   Copyright  VHI. All rights reserved.  Abduction    Levante la pierna hacia el techo. Baje.  Repita con la otra pierna. Repita la serie __10-20__ veces. Haga __2__ sesiones por da.  http://[email or web address].[email or web address]   Copyright  VHI. All rights reserved.   Heel Slide    Doble la rodilla izquierda y lleve el taln hacia las glteos. Repita __10-20__ veces. Haga __2__ sesiones por da.  [email or web address]://[email or web address].[email or web address]   Copyright  VHI. All rights reserved.

## 2015-07-18 ENCOUNTER — Ambulatory Visit: Payer: No Typology Code available for payment source | Admitting: Physical Therapy

## 2015-07-18 DIAGNOSIS — M545 Low back pain, unspecified: Secondary | ICD-10-CM

## 2015-07-18 DIAGNOSIS — M25562 Pain in left knee: Principal | ICD-10-CM

## 2015-07-18 DIAGNOSIS — M25561 Pain in right knee: Secondary | ICD-10-CM

## 2015-07-18 DIAGNOSIS — R262 Difficulty in walking, not elsewhere classified: Secondary | ICD-10-CM

## 2015-07-18 NOTE — Patient Instructions (Signed)
   Copyright  VHI. All rights reserved. Knee to Chest (Flexion)   Pull knee toward chest. Feel stretch in lower back or buttock area. Breathing deeply, Hold _30___ seconds. Repeat with other knee. Repeat _2-3___ times. Do _2_ sessions per day.  http://gt2.exer.us/225   Copyright  VHI. All rights reserved.   Lower Trunk Rotation Stretch   Keeping back flat and feet together, rotate knees to left side. Hold __10__ seconds. Repeat _10___ times per set. Do ___1_ sets per session. Do ___2_ sessions per day.  http://orth.exer.us/122   Copyright  VHI. All rights reserved.  Supine: Leg Stretch With Strap (Basic)   Lie on back with one knee bent, foot flat on floor. Hook strap around other foot. Straighten knee. Keep knee level with other knee. Hold __30_ seconds. Relax leg completely down to floor.  Repeat __2-3_ times per session. Do _1-2__ sessions per day.

## 2015-07-18 NOTE — Therapy (Signed)
[place], [place], [address] Phone: [phone]   Fax:  [phone]  Physical Therapy Treatment  Patient Details  Name: Wendy Petty MRN: [id number] Date of Birth: 1940-09-17 Referring Provider: Adrian Blackwater  Encounter Date: 07/18/2015      PT End of Session - 07/18/15 1007    Visit Number 2   Number of Visits 16   PT Start Time 0948  Pt late for 0930 appt   PT Stop Time 1030   PT Time Calculation (min) 42 min   Activity Tolerance Patient tolerated treatment well   Behavior During Therapy [facility] for tasks assessed/performed      Past Medical History  Diagnosis Date  . Hypertension   . GERD (gastroesophageal reflux disease)   . HYPERLIPIDEMIA   . THYROID NODULE   . PULMONARY NODULE   . Edema   . CHEST PAIN UNSPECIFIED   . ANEMIA, CHRONIC   . Hx of cardiovascular stress test     Lexiscan Myoview (11/14):  No ischemia, EF 68% (normal study)    Past Surgical History  Procedure Laterality Date  . Vaginal hysterectomy  1989  . Tubal ligation    . Left heart catheterization with coronary angiogram N/A 09/16/2011    Procedure: LEFT HEART CATHETERIZATION WITH CORONARY ANGIOGRAM;  Surgeon: [name], MD;  Location: [facility] CATH LAB;  Service: Cardiovascular;  Laterality: N/A;    There were no vitals filed for this visit.  Visit Diagnosis:  Arthralgia of both knees  Difficulty walking up stairs  Difficulty walking  Bilateral low back pain without sciatica      Subjective Assessment - 07/18/15 0951    Subjective Pt late, dtr was trying to vote and was unable.  No changes.  Pain in both knees today 8/10 and into low back.    Currently in Pain? Yes   Pain Score 8    Pain Location Knee   Pain Orientation Right;Left   Pain Descriptors / Indicators Aching   Pain Type Chronic pain   Pain Onset More than a month ago   Pain Frequency Constant   Aggravating Factors  stand, walk   Pain  Relieving Factors rest, meds   Pain Score 8   Pain Location Back   Pain Orientation Lower   Pain Descriptors / Indicators Burning   Pain Type Chronic pain   Pain Onset More than a month ago   Pain Frequency Intermittent   Aggravating Factors  walking             [facility] Adult PT Treatment/Exercise - 07/18/15 0957    Lumbar Exercises: Stretches   Active Hamstring Stretch 2 reps;30 seconds   Single Knee to Chest Stretch 3 reps;30 seconds   Lower Trunk Rotation 10 seconds   Lower Trunk Rotation Limitations x 10   Knee/Hip Exercises: Supine   Quad Sets Strengthening;Both;1 set;10 reps   Short Arc Quad Sets Strengthening;Both;1 set;10 reps   Heel Slides Strengthening;Both;1 set;10 reps   Straight Leg Raises Strengthening;Both;1 set;10 reps   Other Supine Knee/Hip Exercises Pt able to all exercises today without any indication of pain increase   Knee/Hip Exercises: Sidelying   Hip ABduction Strengthening;Both;1 set;10 reps   Moist Heat Therapy   Number Minutes Moist Heat 15 Minutes   Moist Heat Location Knee  bilat.                PT Education - 07/18/15 1004    Education provided Yes  Education Details pain referral lumbar and hip/knee, HEP    Person(s) Educated Patient;Child(ren)   Methods Explanation   Comprehension Verbalized understanding;Need further instruction          PT Short Term Goals - 07/18/15 1010    PT SHORT TERM GOAL #1   Title Patient will be I with initial HEP    Status Achieved   PT SHORT TERM GOAL #2   Title Pt will be able to tolerate 10 min of standing for housework (light) with min/mod increase in pain (knee/back)   Status On-going   PT SHORT TERM GOAL #3   Title Pt will be able to step up with Rt. LE onto step with improved confidence, less pain.    Status On-going   PT SHORT TERM GOAL #4   Title Pt will be able to use 2-3 pain mgmt strategies for home use.    Status On-going           PT Long Term Goals - 07/18/15 1012     PT LONG TERM GOAL #1   Title pt will be I with more advanced HEP as of last visit.    Status On-going   PT LONG TERM GOAL #2   Title Pt will return to doing ADLs and housework without limits of pain most days of the week.    Status On-going   PT LONG TERM GOAL #3   Title Pt will be able to negotiate stairs in community without increase in pain using rails or device.    Status On-going   PT LONG TERM GOAL #4   Title Pt will be able to go without cane for communtiy gait (up to 30 min) and min increase in knee/back pain.    Status On-going   PT LONG TERM GOAL #5   Title FOTO score will improve to no more than 45% limited    Status On-going               Plan - 07/18/15 1009    Clinical Impression Statement Pt late today, able to review HEP and initiate some low back stretches to see how that impacts her pain. No goals met.    PT Next Visit Plan advance as tolerate mat level 1-2 knee/hip and some trunk stretching, NuStep, see what Dr. Awanda Mink said, will send this note to him   PT Home Exercise Plan level 1 knee   Consulted and Agree with Plan of Care Patient;Family member/caregiver        Problem List Patient Active Problem List   Diagnosis Date Noted  . Right medial knee pain 06/29/2015  . Overweight (BMI 25.0-29.9) 06/29/2015  . Chronic pain of left knee 04/21/2015  . Memory deficit 11/28/2014  . Coronary atherosclerosis of native coronary artery 06/24/2013  . History of TIAs 06/24/2013  . GERD (gastroesophageal reflux disease) 06/24/2013  . Depression 12/31/2012  . CHEST PAIN UNSPECIFIED 02/17/2009  . THYROID NODULE 02/14/2009  . Elevated lipids 02/14/2009  . ANEMIA, CHRONIC 02/14/2009  . Essential hypertension 02/14/2009  . PULMONARY NODULE 02/14/2009    PAA,JENNIFER 07/18/2015, 12:56 PM  [facility] [address] [place], [place], [address] Phone: [phone]   Fax:  [phone]  Name: Shital Crayton MRN: [id number] Date of Birth: 1941-03-13    Raeford Razor, PT 07/18/2015 12:57 PM Phone: [phone] Fax: [phone]

## 2015-07-19 ENCOUNTER — Encounter: Payer: Self-pay | Admitting: Family Medicine

## 2015-07-19 ENCOUNTER — Ambulatory Visit (INDEPENDENT_AMBULATORY_CARE_PROVIDER_SITE_OTHER): Payer: No Typology Code available for payment source | Admitting: Family Medicine

## 2015-07-19 VITALS — BP 134/72 | Ht 62.0 in | Wt 155.0 lb

## 2015-07-19 DIAGNOSIS — M4310 Spondylolisthesis, site unspecified: Secondary | ICD-10-CM

## 2015-07-19 DIAGNOSIS — M545 Low back pain, unspecified: Secondary | ICD-10-CM

## 2015-07-19 MED ORDER — PREDNISONE 10 MG (21) PO TBPK: 10.0000 mg | ORAL_TABLET | Freq: Every day | ORAL | Status: DC

## 2015-07-19 MED ORDER — PREDNISONE 10 MG PO TABS: ORAL_TABLET | ORAL | Status: DC

## 2015-07-19 NOTE — Progress Notes (Signed)
  Kennedi Lizardo - 74 y.o. female MRN [id number]  Date of birth: 12-15-1940 Concettina Leth is a 74 y.o. female who presents today for knee pain.  Bilateral knee pain-ongoing for 4-5 years per her report. It is worse after any type of walking up hills or back extension type activities. She has had a x-ray done bilateral standing knees which did not show a whole lot of arthritis and had a previous intra-articular injection which did not do anything for her. She is previously been on anti-inflammatory over-the-counter medications with some relief but not a whole lot. She does have a x-ray from 2009 showing a spondylolisthesis at L5-S1 measuring around 11 mm. She denies frank locking, catching, giving way, instability of either knee.  PMHx - Updated and reviewed.  Contributory factors include: Spondylolisthesis L5-S1 PSHx - Updated and reviewed.  Contributory factors include:  Noncontributory FHx - Updated and reviewed.  Contributory factors include:  Noncontributory Medications - updated and reviewed. Contributory factors are Vicodin when necessary along with Neurontin.   ROS Per HPI.  12 point negative other than per HPI.   Exam:  Filed Vitals:   07/19/15 1515  BP: 134/72   Gen: NAD Cardiorespiratory - Normal respiratory effort/rate.  RRR +SLR B/L LE with radiation.  MS 5/5 B/L LE.  DTR 2/4 B/L LE.  DP/PT 2+ B/L.   Knee: No effusion or erythema.  No TTP medial/lateral joint line.  No crepitus with knee extension.  Negative Meniscal testing.    Imaging:  Reviewed knee and back x-rays

## 2015-07-19 NOTE — Assessment & Plan Note (Signed)
Most likely the cause of her bilateral medial knee pain and cramping is coming from her spondylolisthesis/sciatic symptoms. -We will obtain new lumbar x-rays including flexion-extension view to evaluate for any slippage of the vertebrae on each other. -Depending on the grade we will either send her to physical therapy/give exercises along with some Neurontin/medication or possible neurosurgical intervention. Follow-up in 2 weeks

## 2015-07-21 ENCOUNTER — Ambulatory Visit: Payer: No Typology Code available for payment source | Admitting: Physical Therapy

## 2015-07-21 DIAGNOSIS — M545 Low back pain, unspecified: Secondary | ICD-10-CM

## 2015-07-21 DIAGNOSIS — M25561 Pain in right knee: Secondary | ICD-10-CM

## 2015-07-21 DIAGNOSIS — R262 Difficulty in walking, not elsewhere classified: Secondary | ICD-10-CM

## 2015-07-21 DIAGNOSIS — M25562 Pain in left knee: Principal | ICD-10-CM

## 2015-07-21 NOTE — Therapy (Signed)
[place], [place], [address] Phone: [phone]   Fax:  [phone]  Physical Therapy Treatment  Patient Details  Name: Cambrea Kirt MRN: [id number] Date of Birth: 23-Feb-1941 Referring Provider: Adrian Blackwater  Encounter Date: 07/21/2015      PT End of Session - 07/21/15 0920    Visit Number 3   Number of Visits 16   PT Start Time 0856  Pt late   PT Stop Time 0950   PT Time Calculation (min) 54 min   Activity Tolerance Patient tolerated treatment well   Behavior During Therapy [facility] for tasks assessed/performed      Past Medical History  Diagnosis Date  . Hypertension   . GERD (gastroesophageal reflux disease)   . HYPERLIPIDEMIA   . THYROID NODULE   . PULMONARY NODULE   . Edema   . CHEST PAIN UNSPECIFIED   . ANEMIA, CHRONIC   . Hx of cardiovascular stress test     Lexiscan Myoview (11/14):  No ischemia, EF 68% (normal study)    Past Surgical History  Procedure Laterality Date  . Vaginal hysterectomy  1989  . Tubal ligation    . Left heart catheterization with coronary angiogram N/A 09/16/2011    Procedure: LEFT HEART CATHETERIZATION WITH CORONARY ANGIOGRAM;  Surgeon: [name], MD;  Location: [facility] CATH LAB;  Service: Cardiovascular;  Laterality: N/A;    There were no vitals filed for this visit.  Visit Diagnosis:  Arthralgia of both knees  Difficulty walking up stairs  Difficulty walking  Bilateral low back pain without sciatica      Subjective Assessment - 07/21/15 0904    Subjective Patient feels a bit better, saw MD who examined her back.  See appt note.  Feels a heaviness in LEs.    Diagnostic tests XR with mild degen changes   Currently in Pain? Yes   Pain Score 5    Pain Location Leg   Pain Orientation Right;Left   Pain Descriptors / Indicators Aching;Heaviness   Pain Type Chronic pain   Pain Radiating Towards back and legs   Pain Onset More than a month ago    Pain Frequency Constant   Aggravating Factors  ext   Pain Relieving Factors rest meds    Multiple Pain Sites No              [facility] Adult PT Treatment/Exercise - 07/21/15 0909    Self-Care   Self-Care Other Self-Care Comments   Other Self-Care Comments  back pain referral to LEs   Lumbar Exercises: Stretches   Active Hamstring Stretch 2 reps;30 seconds   Single Knee to Chest Stretch 3 reps;30 seconds   Lower Trunk Rotation 10 seconds   Lower Trunk Rotation Limitations x 10   Knee/Hip Exercises: Aerobic   Nustep 8 min level 3 Ue and LE    Knee/Hip Exercises: Supine   Hip Adduction Isometric Strengthening;1 set;10 reps   Bridges with [employer] Strengthening;1 set;10 reps   Knee/Hip Exercises: Sidelying   Hip ABduction Strengthening;Right;Left;1 set;10 reps   Moist Heat Therapy   Number Minutes Moist Heat 15 Minutes   Moist Heat Location Hip   Electrical Stimulation   Electrical Stimulation Location Rt. low lumbar and lateral thigh   Electrical Stimulation Action IFC   Electrical Stimulation Parameters to tol   Electrical Stimulation Goals Pain   Manual Therapy   Manual Therapy Myofascial release   Manual therapy comments stretch to Rt. side in L  sidelying (QL position)   Myofascial Release traction to iliac crest to elongate Rt. side.                 PT Education - 07/21/15 0912    Education provided Yes   Education Details stretching vs pain    Person(s) Educated Patient   Methods Explanation   Comprehension Verbalized understanding;Returned demonstration          PT Short Term Goals - 07/18/15 1010    PT SHORT TERM GOAL #1   Title Patient will be I with initial HEP    Status Achieved   PT SHORT TERM GOAL #2   Title Pt will be able to tolerate 10 min of standing for housework (light) with min/mod increase in pain (knee/back)   Status On-going   PT SHORT TERM GOAL #3   Title Pt will be able to step up with Rt. LE onto step with improved  confidence, less pain.    Status On-going   PT SHORT TERM GOAL #4   Title Pt will be able to use 2-3 pain mgmt strategies for home use.    Status On-going           PT Long Term Goals - 07/18/15 1012    PT LONG TERM GOAL #1   Title pt will be I with more advanced HEP as of last visit.    Status On-going   PT LONG TERM GOAL #2   Title Pt will return to doing ADLs and housework without limits of pain most days of the week.    Status On-going   PT LONG TERM GOAL #3   Title Pt will be able to negotiate stairs in community without increase in pain using rails or device.    Status On-going   PT LONG TERM GOAL #4   Title Pt will be able to go without cane for communtiy gait (up to 30 min) and min increase in knee/back pain.    Status On-going   PT LONG TERM GOAL #5   Title FOTO score will improve to no more than 45% limited    Status On-going               Plan - 07/21/15 0920    Clinical Impression Statement Patient doing better, taking prednisone.  MD ordered XR, will consider PT for lumbar spine if needed.  Cont to progress, no goals met.     PT Next Visit Plan advance as tolerate mat level 1-2 knee/hip and some trunk stretching, NuStep   PT Home Exercise Plan level 1 knee and trunk stretching   Consulted and Agree with Plan of Care Patient        Problem List Patient Active Problem List   Diagnosis Date Noted  . Spondylolisthesis 07/19/2015  . Right medial knee pain 06/29/2015  . Overweight (BMI 25.0-29.9) 06/29/2015  . Chronic pain of left knee 04/21/2015  . Memory deficit 11/28/2014  . Coronary atherosclerosis of native coronary artery 06/24/2013  . History of TIAs 06/24/2013  . GERD (gastroesophageal reflux disease) 06/24/2013  . Depression 12/31/2012  . CHEST PAIN UNSPECIFIED 02/17/2009  . THYROID NODULE 02/14/2009  . Elevated lipids 02/14/2009  . ANEMIA, CHRONIC 02/14/2009  . Essential hypertension 02/14/2009  . PULMONARY NODULE 02/14/2009     PAA,JENNIFER 07/21/2015, 10:37 AM  [facility] [address] [place], [place], [address] Phone: [phone]   Fax:  [phone]  Name: Cheyane Ayon MRN: [id number] Date  of Birth: 1940-12-22   Raeford Razor, PT 07/21/2015 10:37 AM Phone: [phone] Fax: [phone]

## 2015-07-24 ENCOUNTER — Ambulatory Visit (HOSPITAL_COMMUNITY)
Admission: RE | Admit: 2015-07-24 | Discharge: 2015-07-24 | Disposition: A | Discharge status: Home / Self Care | Payer: No Typology Code available for payment source | Source: Ambulatory Visit | Attending: Family Medicine | Admitting: Family Medicine

## 2015-07-24 DIAGNOSIS — M545 Low back pain, unspecified: Secondary | ICD-10-CM

## 2015-07-24 DIAGNOSIS — M4806 Spinal stenosis, lumbar region: Secondary | ICD-10-CM | POA: Insufficient documentation

## 2015-07-24 DIAGNOSIS — M5137 Other intervertebral disc degeneration, lumbosacral region: Secondary | ICD-10-CM | POA: Insufficient documentation

## 2015-07-24 DIAGNOSIS — M4316 Spondylolisthesis, lumbar region: Secondary | ICD-10-CM | POA: Insufficient documentation

## 2015-07-24 DIAGNOSIS — M5136 Other intervertebral disc degeneration, lumbar region: Secondary | ICD-10-CM | POA: Insufficient documentation

## 2015-07-25 ENCOUNTER — Ambulatory Visit (INDEPENDENT_AMBULATORY_CARE_PROVIDER_SITE_OTHER): Payer: No Typology Code available for payment source | Admitting: *Deleted

## 2015-07-25 VITALS — BP 126/65 | HR 69 | Ht 62.0 in | Wt 155.8 lb

## 2015-07-25 DIAGNOSIS — I10 Essential (primary) hypertension: Secondary | ICD-10-CM

## 2015-07-25 NOTE — Progress Notes (Signed)
Patient in today for a check of b/p.  This was done to f/u reestablishment of Coreg at last visit.  B/p was 126/65, hr 69.  Patient was told to maintain current medications and come back for her next scheduled appt with Dr Johnsie Cancel in Mar 2017.  Continue to monitor her b/p and call if symptoms develop.

## 2015-07-27 ENCOUNTER — Ambulatory Visit: Payer: No Typology Code available for payment source | Admitting: Physical Therapy

## 2015-07-27 DIAGNOSIS — M79662 Pain in left lower leg: Secondary | ICD-10-CM

## 2015-07-27 DIAGNOSIS — M545 Low back pain, unspecified: Secondary | ICD-10-CM

## 2015-07-27 DIAGNOSIS — M25562 Pain in left knee: Principal | ICD-10-CM

## 2015-07-27 DIAGNOSIS — M25561 Pain in right knee: Secondary | ICD-10-CM

## 2015-07-27 DIAGNOSIS — R262 Difficulty in walking, not elsewhere classified: Secondary | ICD-10-CM

## 2015-07-27 NOTE — Therapy (Signed)
[place], [place], [address] Phone: [phone]   Fax:  [phone]  Physical Therapy Treatment  Patient Details  Name: Wendy Petty MRN: [id number] Date of Birth: 1940-10-17 Referring Provider: Adrian Blackwater  Encounter Date: 07/27/2015      PT End of Session - 07/27/15 0911    Visit Number 4   Number of Visits 16   PT Start Time 0910  came 25 min late   PT Stop Time 0945   PT Time Calculation (min) 35 min   Activity Tolerance Patient tolerated treatment well   Behavior During Therapy [facility] for tasks assessed/performed      Past Medical History  Diagnosis Date  . Hypertension   . GERD (gastroesophageal reflux disease)   . HYPERLIPIDEMIA   . THYROID NODULE   . PULMONARY NODULE   . Edema   . CHEST PAIN UNSPECIFIED   . ANEMIA, CHRONIC   . Hx of cardiovascular stress test     Lexiscan Myoview (11/14):  No ischemia, EF 68% (normal study)    Past Surgical History  Procedure Laterality Date  . Vaginal hysterectomy  1989  . Tubal ligation    . Left heart catheterization with coronary angiogram N/A 09/16/2011    Procedure: LEFT HEART CATHETERIZATION WITH CORONARY ANGIOGRAM;  Surgeon: [name], MD;  Location: [facility] CATH LAB;  Service: Cardiovascular;  Laterality: N/A;    There were no vitals filed for this visit.  Visit Diagnosis:  Arthralgia of both knees  Difficulty walking up stairs  Difficulty walking  Bilateral low back pain without sciatica  Calf pain, left      Subjective Assessment - 07/27/15 0910    Subjective Pt says she has pain in low back and knees.  and has cracking and pain in knees.   Patient is accompained by: Family member;Interpreter   Currently in Pain? Yes   Pain Score 5    Pain Location Knee  including back and front of thigh   Pain Orientation Right;Left   Pain Descriptors / Indicators Aching   Pain Score 8   Pain Location Back   Pain Orientation Lower    Pain Descriptors / Indicators Burning   Pain Type Chronic pain   Pain Score 5  originally 8/10   Pain Location Calf   Pain Orientation Left   Pain Descriptors / Indicators Aching   Pain Type Acute pain   Pain Onset In the past 7 days   Pain Frequency Intermittent                         [facility] Adult PT Treatment/Exercise - 07/27/15 0910    Lumbar Exercises: Stretches   Active Hamstring Stretch 2 reps;30 seconds  with sheet and ample VC/TC for straightening of knee   Active Hamstring Stretch Limitations Pt cautianed to not overstretch   Moist Heat Therapy   Number Minutes Moist Heat 15 Minutes   Moist Heat Location Hip   Ankle Exercises: Stretches   Soleus Stretch 3 reps;30 seconds   Gastroc Stretch 3 reps;30 seconds   Gastroc Stretch Limitations Pt with difficulty keeping heel down and straight                PT Education - 07/27/15 0925    Education provided Yes   Education Details added to HEP Gastroc and soleus stretch. explanation of importance of Flexibilityfor back,leg comfort before strengthning   Person(s) Educated Patient;Other (comment)  interpreter   Methods Explanation;Demonstration;Verbal cues;Handout;Tactile cues   Comprehension Verbalized understanding;Returned demonstration          PT Short Term Goals - 07/18/15 1010    PT SHORT TERM GOAL #1   Title Patient will be I with initial HEP    Status Achieved   PT SHORT TERM GOAL #2   Title Pt will be able to tolerate 10 min of standing for housework (light) with min/mod increase in pain (knee/back)   Status On-going   PT SHORT TERM GOAL #3   Title Pt will be able to step up with Rt. LE onto step with improved confidence, less pain.    Status On-going   PT SHORT TERM GOAL #4   Title Pt will be able to use 2-3 pain mgmt strategies for home use.    Status On-going           PT Long Term Goals - 07/18/15 1012    PT LONG TERM GOAL #1   Title pt will be I with more advanced  HEP as of last visit.    Status On-going   PT LONG TERM GOAL #2   Title Pt will return to doing ADLs and housework without limits of pain most days of the week.    Status On-going   PT LONG TERM GOAL #3   Title Pt will be able to negotiate stairs in community without increase in pain using rails or device.    Status On-going   PT LONG TERM GOAL #4   Title Pt will be able to go without cane for communtiy gait (up to 30 min) and min increase in knee/back pain.    Status On-going   PT LONG TERM GOAL #5   Title FOTO score will improve to no more than 45% limited    Status On-going               Plan - 07/27/15 1043    Clinical Impression Statement Pt arrives 25 minutes late.  Pt complained of cracking of bil knees and back pain and left calf pain.  Pt given addition of HEP with gastroc / soleus stretch and reviewed Hamstring stretch.  Pt tends to overstretch instead of gently stretching to limit and  needs to take additional time 30 to 60 seconds for prolonged stretch. Pt states she feels better after she stretches appropriately .  Pt will benefit from strengthening for LE's  in addition to flexibility   Pt will benefit from skilled therapeutic intervention in order to improve on the following deficits Abnormal gait;Decreased strength;Increased muscle spasms;Improper body mechanics;Difficulty walking;Decreased knowledge of use of DME;Decreased activity tolerance;Decreased mobility;Impaired flexibility;Decreased range of motion;Decreased balance;Pain;Obesity;Impaired UE functional use;Increased edema;Decreased endurance;Increased fascial restricitons   Rehab Potential Good   PT Frequency 2x / week   PT Duration 8 weeks   PT Treatment/Interventions Gait training;Stair training;DME Instruction;ADLs/Self Care Home Management;Vasopneumatic Device;Manual techniques;Functional mobility training;Cryotherapy;Therapeutic activities;Therapeutic exercise;Electrical Stimulation;Iontophoresis 4mg /ml  Dexamethasone;Balance training;Neuromuscular re-education;Moist Heat;Ultrasound;Parrafin;Patient/family education;Dry needling;Taping;Passive range of motion   PT Next Visit Plan advance as tolerate mat level 1-2 knee/hip and some trunk stretching, NuStep, assess goals   PT Home Exercise Plan level 1 knee and trunk stretching plus gastroc stretching.   Consulted and Agree with Plan of Care Patient;Other (Comment)  interpreter        Problem List Patient Active Problem List   Diagnosis Date Noted  . Spondylolisthesis 07/19/2015  . Right medial knee pain 06/29/2015  . Overweight (BMI 25.0-29.9) 06/29/2015  .  Chronic pain of left knee 04/21/2015  . Memory deficit 11/28/2014  . Coronary atherosclerosis of native coronary artery 06/24/2013  . History of TIAs 06/24/2013  . GERD (gastroesophageal reflux disease) 06/24/2013  . Depression 12/31/2012  . CHEST PAIN UNSPECIFIED 02/17/2009  . THYROID NODULE 02/14/2009  . Elevated lipids 02/14/2009  . ANEMIA, CHRONIC 02/14/2009  . Essential hypertension 02/14/2009  . PULMONARY NODULE 02/14/2009    Voncille Lo, PT 07/27/2015 10:48 AM Phone: [phone] Fax: [facility] [place], [place], [address] Phone: [phone]   Fax:  [phone]  Name: Kache Smyer MRN: [id number] Date of Birth: 09/20/1940

## 2015-07-27 NOTE — Patient Instructions (Addendum)
  Achilles / Gastroc, Standing   De pie, pie derecho detrs, el taln en el suelo y se gir ligeramente hacia fuera, la pierna recta, la pierna hacia adelante doblada. Mueva las caderas hacia adelante. Mantenga _30__ segundos. Repita _3__ veces por sesin. Haga _3__ sesiones por da.    Stretching: Soleus   Prese con el pie derecho hacia atrs, con ambas rodillas flexionadas. Mantener el taln en el piso, girar ligeramente hacia fuera, inclinarse en la pared hasta que el estiramiento se siente en la pantorrilla inferior. Mantenga _30___ segundos. Repita __3__ Juliette Mangle. Haga __3__ sesiones por da.  Voncille Lo, PT 07/27/2015 9:20 AM Phone: [phone] Fax: [phone]

## 2015-07-28 ENCOUNTER — Ambulatory Visit: Payer: No Typology Code available for payment source | Admitting: Physical Therapy

## 2015-07-28 DIAGNOSIS — M25561 Pain in right knee: Secondary | ICD-10-CM

## 2015-07-28 DIAGNOSIS — R262 Difficulty in walking, not elsewhere classified: Secondary | ICD-10-CM

## 2015-07-28 DIAGNOSIS — M79662 Pain in left lower leg: Secondary | ICD-10-CM

## 2015-07-28 DIAGNOSIS — M545 Low back pain, unspecified: Secondary | ICD-10-CM

## 2015-07-28 DIAGNOSIS — M25562 Pain in left knee: Principal | ICD-10-CM

## 2015-07-28 NOTE — Therapy (Signed)
[place], [place], [address] Phone: [phone]   Fax:  [phone]  Physical Therapy Treatment  Patient Details  Name: Wendy Petty MRN: [id number] Date of Birth: August 28, 1941 Referring Provider: Adrian Blackwater  Encounter Date: 07/28/2015      PT End of Session - 07/28/15 1201    Visit Number 5   Number of Visits 16   Authorization Type [employer] discount 100%   PT Start Time 0935   PT Stop Time 1030   PT Time Calculation (min) 55 min   Activity Tolerance Patient tolerated treatment well      Past Medical History  Diagnosis Date  . Hypertension   . GERD (gastroesophageal reflux disease)   . HYPERLIPIDEMIA   . THYROID NODULE   . PULMONARY NODULE   . Edema   . CHEST PAIN UNSPECIFIED   . ANEMIA, CHRONIC   . Hx of cardiovascular stress test     Lexiscan Myoview (11/14):  No ischemia, EF 68% (normal study)    Past Surgical History  Procedure Laterality Date  . Vaginal hysterectomy  1989  . Tubal ligation    . Left heart catheterization with coronary angiogram N/A 09/16/2011    Procedure: LEFT HEART CATHETERIZATION WITH CORONARY ANGIOGRAM;  Surgeon: [name], MD;  Location: [facility] CATH LAB;  Service: Cardiovascular;  Laterality: N/A;    There were no vitals filed for this visit.  Visit Diagnosis:  Arthralgia of both knees  Difficulty walking up stairs  Difficulty walking  Bilateral low back pain without sciatica  Calf pain, left      Subjective Assessment - 07/28/15 0950    Subjective Patient arrives 25 min late for appt.    Patient is accompained by: Interpreter   Currently in Pain? Yes   Pain Score 5    Pain Location Knee   Pain Orientation Right;Left   Pain Type Chronic pain   Pain Onset More than a month ago   Pain Score 5   Pain Location Back   Pain Orientation Lower   Pain Type Chronic pain                         [facility] Adult PT Treatment/Exercise -  07/28/15 0951    Lumbar Exercises: Supine   Ab Set 10 reps   Bent Knee Raise 10 reps   Isometric Hip Flexion 10 reps   Lumbar Exercises: Sidelying   Clam 10 reps  Bilaterally   Knee/Hip Exercises: Aerobic   Nustep 8 min level 3 Ue and LE    Knee/Hip Exercises: Seated   Heel Slides Strengthening;Right;Left;10 reps   Other Seated Knee/Hip Exercises red band ankle DF with hip flexion 10x B   Moist Heat Therapy   Number Minutes Moist Heat 12 Minutes   Moist Heat Location Lumbar Spine;Hip                PT Education - 07/28/15 1200    Education provided Yes   Education Details red band knee extension and hip flexion 10x  1x/day   Person(s) Educated Patient   Methods Explanation;Handout;Demonstration   Comprehension Verbalized understanding;Returned demonstration          PT Short Term Goals - 07/28/15 1205    PT SHORT TERM GOAL #1   Title Patient will be I with initial HEP    Status Achieved   PT SHORT TERM GOAL #2   Title Pt will be able  to tolerate 10 min of standing for housework (light) with min/mod increase in pain (knee/back)   Time 4   Period Weeks   Status On-going   PT SHORT TERM GOAL #3   Title Pt will be able to step up with Rt. LE onto step with improved confidence, less pain.    Time 4   Period Weeks   Status On-going   PT SHORT TERM GOAL #4   Title Pt will be able to use 2-3 pain mgmt strategies for home use.    Time 4   Period Weeks   Status On-going           PT Long Term Goals - 07/28/15 1206    PT LONG TERM GOAL #1   Title pt will be I with more advanced HEP as of last visit.    Time 8   Period Weeks   Status On-going   PT LONG TERM GOAL #2   Title Pt will return to doing ADLs and housework without limits of pain most days of the week.    Time 8   Period Weeks   Status On-going   PT LONG TERM GOAL #3   Title Pt will be able to negotiate stairs in community without increase in pain using rails or device.    Time 8   Period Weeks    Status On-going   PT LONG TERM GOAL #4   Title Pt will be able to go without cane for communtiy gait (up to 30 min) and min increase in knee/back pain.    Time 8   Period Weeks   Status On-going   PT LONG TERM GOAL #5   Title FOTO score will improve to no more than 45% limited    Time 8   Period Weeks   Status On-going               Plan - 07/28/15 1202    Clinical Impression Statement Patient arrived 25 min late.  Rescheduled to new appt time with available language interpreter "Sonia".  The patient reports an overall improvement in pain and function since starting PT.  She is now able to go up and down the stair with greater ease and is not using her cane as much.  She performs core and LE strengthening exercises without an exacerbation of pain.  Therapist providing verbal and tactile cues for proper technique.     PT Next Visit Plan advance as tolerate mat level 1-2 knee/hip and some trunk stretching, NuStep, assess goals        Problem List Patient Active Problem List   Diagnosis Date Noted  . Spondylolisthesis 07/19/2015  . Right medial knee pain 06/29/2015  . Overweight (BMI 25.0-29.9) 06/29/2015  . Chronic pain of left knee 04/21/2015  . Memory deficit 11/28/2014  . Coronary atherosclerosis of native coronary artery 06/24/2013  . History of TIAs 06/24/2013  . GERD (gastroesophageal reflux disease) 06/24/2013  . Depression 12/31/2012  . CHEST PAIN UNSPECIFIED 02/17/2009  . THYROID NODULE 02/14/2009  . Elevated lipids 02/14/2009  . ANEMIA, CHRONIC 02/14/2009  . Essential hypertension 02/14/2009  . PULMONARY NODULE 02/14/2009    Alvera Singh 07/28/2015, 12:07 PM  [facility] [address] [place], [place], [address] Phone: [phone]   Fax:  [phone]  Name: Thaily Stefano MRN: [id number] Date of Birth: [birth year]   Ruben Im, PT 07/28/2015 12:07 PM Phone: [phone] Fax:  [phone]

## 2015-07-28 NOTE — Patient Instructions (Signed)
Pictures provided of seated knee extension and seated ankle DF with hip flexion with red band.  Interpreter reviewed with patient frequency and duration

## 2015-07-31 ENCOUNTER — Encounter: Payer: No Typology Code available for payment source | Admitting: Physical Therapy

## 2015-07-31 ENCOUNTER — Ambulatory Visit: Payer: No Typology Code available for payment source | Admitting: Physical Therapy

## 2015-07-31 DIAGNOSIS — R262 Difficulty in walking, not elsewhere classified: Secondary | ICD-10-CM

## 2015-07-31 DIAGNOSIS — M25562 Pain in left knee: Principal | ICD-10-CM

## 2015-07-31 DIAGNOSIS — M25561 Pain in right knee: Secondary | ICD-10-CM

## 2015-07-31 DIAGNOSIS — M545 Low back pain, unspecified: Secondary | ICD-10-CM

## 2015-07-31 NOTE — Therapy (Signed)
[place], [place], [address] Phone: [phone]   Fax:  [phone]  Physical Therapy Treatment  Patient Details  Name: Concetta Guion MRN: [id number] Date of Birth: 10/17/1940 Referring Provider: Adrian Blackwater  Encounter Date: 07/31/2015      PT End of Session - 07/31/15 1258    Visit Number 6   Number of Visits 16   PT Start Time 0930   PT Stop Time 1020   PT Time Calculation (min) 50 min   Activity Tolerance Patient tolerated treatment well   Behavior During Therapy [facility] for tasks assessed/performed      Past Medical History  Diagnosis Date  . Hypertension   . GERD (gastroesophageal reflux disease)   . HYPERLIPIDEMIA   . THYROID NODULE   . PULMONARY NODULE   . Edema   . CHEST PAIN UNSPECIFIED   . ANEMIA, CHRONIC   . Hx of cardiovascular stress test     Lexiscan Myoview (11/14):  No ischemia, EF 68% (normal study)    Past Surgical History  Procedure Laterality Date  . Vaginal hysterectomy  1989  . Tubal ligation    . Left heart catheterization with coronary angiogram N/A 09/16/2011    Procedure: LEFT HEART CATHETERIZATION WITH CORONARY ANGIOGRAM;  Surgeon: [name], MD;  Location: [facility] CATH LAB;  Service: Cardiovascular;  Laterality: N/A;    There were no vitals filed for this visit.  Visit Diagnosis:  Arthralgia of both knees  Difficulty walking up stairs  Difficulty walking  Bilateral low back pain without sciatica      Subjective Assessment - 07/31/15 0927    Subjective Pt arrives wrong time for appt.  Daughter to interpret.    Patient is accompained by: Family member   Currently in Pain? Yes   Pain Score 6    Pain Location Knee  Back               [facility] Adult PT Treatment/Exercise - 07/31/15 0934    Lumbar Exercises: Stretches   Single Knee to Chest Stretch 3 reps;30 seconds   Lower Trunk Rotation 10 seconds   Lower Trunk Rotation Limitations x 10    Lumbar Exercises: Supine   Ab Set 10 reps   Bent Knee Raise --   Bridge 10 reps   Isometric Hip Flexion --   Lumbar Exercises: Sidelying   Clam 20 reps  Bilaterally   Hip Abduction 10 reps   Knee/Hip Exercises: Standing   Lateral Step Up Both;1 set;10 reps;Hand Hold: 2;Step Height: 6"   Forward Step Up Both;1 set;10 reps;Hand Hold: 2;Step Height: 6"   Knee/Hip Exercises: Seated   Other Seated Knee/Hip Exercises red band ankle DF with hip flexion 10x B   Cryotherapy   Number Minutes Cryotherapy 15 Minutes   Cryotherapy Location Lumbar Spine   Type of Cryotherapy Ice pack   Electrical Stimulation   Electrical Stimulation Location Lumbar bilat.    Electrical Stimulation Action IFC   Electrical Stimulation Parameters to [employer] Goals Pain                PT Education - 07/31/15 1257    Education provided Yes   Education Details breathing, stabilization   Person(s) Educated Patient   Methods Explanation;Demonstration;Tactile cues;Verbal cues;Handout   Comprehension Verbalized understanding;Need further instruction          PT Short Term Goals - 07/31/15 0930    PT SHORT TERM GOAL #1  Title Patient will be I with initial HEP    Status Achieved   PT SHORT TERM GOAL #2   Title Pt will be able to tolerate 10 min of standing for housework (light) with min/mod increase in pain (knee/back)   Status On-going   PT SHORT TERM GOAL #3   Title Pt will be able to step up with Rt. LE onto step with improved confidence, less pain.    Status On-going   PT SHORT TERM GOAL #4   Title Pt will be able to use 2-3 pain mgmt strategies for home use.    Baseline meds, exercises   Status Partially Met           PT Long Term Goals - 07/31/15 1015    PT LONG TERM GOAL #1   Title pt will be I with more advanced HEP as of last visit.    Status On-going   PT LONG TERM GOAL #2   Title Pt will return to doing ADLs and housework without limits of pain most days of  the week.    Status On-going   PT LONG TERM GOAL #3   Title Pt will be able to negotiate stairs in community without increase in pain using rails or device.    Status On-going   PT LONG TERM GOAL #4   Title Pt will be able to go without cane for communtiy gait (up to 30 min) and min increase in knee/back pain.    Status On-going   PT LONG TERM GOAL #5   Title FOTO score will improve to no more than 45% limited    Status On-going               Plan - 07/31/15 1258    Clinical Impression Statement Cont to improve in function and did not use cane today.  She is I with HEP and gives good effort.  Needs help with deep breathing techniques.     PT Next Visit Plan advance as tolerate mat level 1-2 knee/hip and some trunk stretching, NuStep, CHECK SPECIFIC GOALS   PT Home Exercise Plan level 1 knee and trunk stretching plus gastroc stretching.   Consulted and Agree with Plan of Care Patient   Family Member Consulted daughter        Problem List Patient Active Problem List   Diagnosis Date Noted  . Spondylolisthesis 07/19/2015  . Right medial knee pain 06/29/2015  . Overweight (BMI 25.0-29.9) 06/29/2015  . Chronic pain of left knee 04/21/2015  . Memory deficit 11/28/2014  . Coronary atherosclerosis of native coronary artery 06/24/2013  . History of TIAs 06/24/2013  . GERD (gastroesophageal reflux disease) 06/24/2013  . Depression 12/31/2012  . CHEST PAIN UNSPECIFIED 02/17/2009  . THYROID NODULE 02/14/2009  . Elevated lipids 02/14/2009  . ANEMIA, CHRONIC 02/14/2009  . Essential hypertension 02/14/2009  . PULMONARY NODULE 02/14/2009    PAA,JENNIFER 07/31/2015, 1:01 PM  [facility] [address] [place], [place], [address] Phone: [phone]   Fax:  [phone]  Name: Wilma Michaelson MRN: [id number] Date of Birth: 09-Dec-1940   Raeford Razor, PT 07/31/2015 1:01 PM Phone: [phone] Fax: [phone]

## 2015-07-31 NOTE — Patient Instructions (Signed)
Abdominal Breathing Lying Down    Place one hand on stomach. Breathe in slowly through nose, letting stomach rise. Breathe out gently through pursed lips, letting stomach fall. Breathe out for at least twice as long as you breathe in. Repeat ____ times, ____ times daily.  Copyright  VHI. All rights reserved.    Stabilization: Transverse Abdominus Contraction - Supine    Wendy Petty arriba con rodillas flexionadas, pies apoyados. Coloque los dedos [name] justo dentro de la pelvis. Contraiga los abdominales llevando el ombligo hacia la columna. Sienta la contraccin muscular, mantenga la pelvis y espalda fijas. Repita __10__ veces por rutina. Realice [id number] [name] por sesin. Realice [id number] sesiones por semana.  Copyright  VHI. All rights reserved.

## 2015-08-01 ENCOUNTER — Ambulatory Visit: Payer: No Typology Code available for payment source | Admitting: Physical Therapy

## 2015-08-01 DIAGNOSIS — M545 Low back pain, unspecified: Secondary | ICD-10-CM

## 2015-08-01 DIAGNOSIS — M25561 Pain in right knee: Secondary | ICD-10-CM

## 2015-08-01 DIAGNOSIS — R262 Difficulty in walking, not elsewhere classified: Secondary | ICD-10-CM

## 2015-08-01 DIAGNOSIS — M25562 Pain in left knee: Principal | ICD-10-CM

## 2015-08-01 NOTE — Therapy (Signed)
[place], [place], [address] Phone: [phone]   Fax:  [phone]  Physical Therapy Treatment  Patient Details  Name: Wendy Petty MRN: [id number] Date of Birth: Aug 25, 1941 Referring Provider: Adrian Blackwater  Encounter Date: 08/01/2015      PT End of Session - 08/01/15 1632    Visit Number 7   Number of Visits 16   Authorization Type [employer] discount 100%   PT Start Time 0933   PT Stop Time 1012   PT Time Calculation (min) 39 min   Activity Tolerance Patient tolerated treatment well   Behavior During Therapy [facility] for tasks assessed/performed      Past Medical History  Diagnosis Date  . Hypertension   . GERD (gastroesophageal reflux disease)   . HYPERLIPIDEMIA   . THYROID NODULE   . PULMONARY NODULE   . Edema   . CHEST PAIN UNSPECIFIED   . ANEMIA, CHRONIC   . Hx of cardiovascular stress test     Lexiscan Myoview (11/14):  No ischemia, EF 68% (normal study)    Past Surgical History  Procedure Laterality Date  . Vaginal hysterectomy  1989  . Tubal ligation    . Left heart catheterization with coronary angiogram N/A 09/16/2011    Procedure: LEFT HEART CATHETERIZATION WITH CORONARY ANGIOGRAM;  Surgeon: [name], MD;  Location: [facility] CATH LAB;  Service: Cardiovascular;  Laterality: N/A;    There were no vitals filed for this visit.  Visit Diagnosis:  Arthralgia of both knees  Bilateral low back pain without sciatica  Difficulty walking up stairs      Subjective Assessment - 08/01/15 1622    Pain Orientation Right;Left   Pain Descriptors / Indicators Cramping   Multiple Pain Sites Yes   Pain Score 5   Pain Location Back   Pain Orientation Lower   Pain Descriptors / Indicators Other (Comment)  heat/fire                         [facility] Adult PT Treatment/Exercise - 08/01/15 0001    Lumbar Exercises: Stretches   Single Knee to Chest Stretch 3 reps;30 seconds   Pelvic Tilt --  1X10 with verbal and tactile cues   Lumbar Exercises: Supine   Ab Set 10 reps;5 seconds   AB Set Limitations verbal and tactile cues   Clam --  bilateral   Bent Knee Raise 10 reps   Isometric Hip Flexion 10 reps   Other Supine Lumbar Exercises pelvic tilt with hip flexion, alternating bilaterally 1 X 10. Verbal and tactile cues needed.    Lumbar Exercises: Sidelying   Clam 10 reps   Clam Limitations bilateral, verbal and tactile cues   Knee/Hip Exercises: Aerobic   Nustep 8 min L3 UE+LE   Knee/Hip Exercises: Seated   Other Seated Knee/Hip Exercises red band ankle DF with hip flexion 10x B                PT Education - 08/01/15 1630    Education provided Yes   Education Details Education on correct technique with HEP, needing heavy cues for core activation with exercises and avoiding compensations.    Person(s) Educated Patient   Methods Explanation;Demonstration;Tactile cues;Verbal cues   Comprehension Verbalized understanding;Returned demonstration          PT Short Term Goals - 07/31/15 0930    PT SHORT TERM GOAL #1   Title Patient will be I with initial  HEP    Status Achieved   PT SHORT TERM GOAL #2   Title Pt will be able to tolerate 10 min of standing for housework (light) with min/mod increase in pain (knee/back)   Status On-going   PT SHORT TERM GOAL #3   Title Pt will be able to step up with Rt. LE onto step with improved confidence, less pain.    Status On-going   PT SHORT TERM GOAL #4   Title Pt will be able to use 2-3 pain mgmt strategies for home use.    Baseline meds, exercises   Status Partially Met           PT Long Term Goals - 07/31/15 1015    PT LONG TERM GOAL #1   Title pt will be I with more advanced HEP as of last visit.    Status On-going   PT LONG TERM GOAL #2   Title Pt will return to doing ADLs and housework without limits of pain most days of the week.    Status On-going   PT LONG TERM GOAL #3   Title Pt will  be able to negotiate stairs in community without increase in pain using rails or device.    Status On-going   PT LONG TERM GOAL #4   Title Pt will be able to go without cane for communtiy gait (up to 30 min) and min increase in knee/back pain.    Status On-going   PT LONG TERM GOAL #5   Title FOTO score will improve to no more than 45% limited    Status On-going               Plan - 08/01/15 1633    Clinical Impression Statement Patient making gradual progress, needing verbal and tactile cues for correct technique with core stabilization exercises. Denied any increased pain following session.    PT Frequency 2x / week   PT Next Visit Plan progress core stabilization exercises as tolerated with emphasis on technique and avoiding compensations.    PT Home Exercise Plan level 1 knee and trunk stretching plus gastroc stretching.   Consulted and Agree with Plan of Care Patient        Problem List Patient Active Problem List   Diagnosis Date Noted  . Spondylolisthesis 07/19/2015  . Right medial knee pain 06/29/2015  . Overweight (BMI 25.0-29.9) 06/29/2015  . Chronic pain of left knee 04/21/2015  . Memory deficit 11/28/2014  . Coronary atherosclerosis of native coronary artery 06/24/2013  . History of TIAs 06/24/2013  . GERD (gastroesophageal reflux disease) 06/24/2013  . Depression 12/31/2012  . CHEST PAIN UNSPECIFIED 02/17/2009  . THYROID NODULE 02/14/2009  . Elevated lipids 02/14/2009  . ANEMIA, CHRONIC 02/14/2009  . Essential hypertension 02/14/2009  . PULMONARY NODULE 02/14/2009    Linard Millers, PT, CSCS 08/01/2015, 4:39 PM  [facility] [address] [place], [place], [address] Phone: [phone]   Fax:  [phone]  Name: Dakisha Schoof MRN: [id number] Date of Birth: 1941/02/06

## 2015-08-02 ENCOUNTER — Ambulatory Visit (INDEPENDENT_AMBULATORY_CARE_PROVIDER_SITE_OTHER): Payer: No Typology Code available for payment source | Admitting: Family Medicine

## 2015-08-02 ENCOUNTER — Encounter: Payer: Self-pay | Admitting: Family Medicine

## 2015-08-02 VITALS — BP 140/82 | Ht 62.0 in | Wt 155.0 lb

## 2015-08-02 DIAGNOSIS — M4310 Spondylolisthesis, site unspecified: Secondary | ICD-10-CM

## 2015-08-02 MED ORDER — MELOXICAM 15 MG PO TABS: 15.0000 mg | ORAL_TABLET | Freq: Every day | ORAL | Status: DC

## 2015-08-02 NOTE — Assessment & Plan Note (Signed)
Most likely the cause of her bilateral medial knee pain and cramping is coming from her spondylolisthesis/sciatic symptoms. -X-rays reviewed showing about an 11 mm anterior spondylolisthesis of L5 on S1. Her symptoms are greatly improved on therapy and continues on Neurontin -Consider possible neurosurgical intervention if continues to have symptoms versus MRI of the worsening knee. Follow-up in 2 months

## 2015-08-02 NOTE — Progress Notes (Signed)
  Wendy Petty - 74 y.o. female MRN [id number]  Date of birth: December 02, 1940 Wendy Petty is a 74 y.o. female who presents today for knee pain.  Bilateral knee pain-ongoing for 4-5 years per her report. It is worse after any type of walking up hills or back extension type activities. She has had a x-ray done bilateral standing knees which did not show a whole lot of arthritis and had a previous intra-articular injection which did not do anything for her. She is previously been on anti-inflammatory over-the-counter medications with some relief but not a whole lot. She does have a x-ray from 2009 showing a spondylolisthesis at L5-S1 measuring around 11 mm. She denies frank locking, catching, giving way, instability of either knee.  F/U - x-ray of her lumbar spine showing 11 mm anterior spondylolisthesis with stability with flexion/extension views. She is much improved after physical therapy but does not feel like the Voltaren is doing much for her. She also has done a course of Sterapred which has helped greatly. Denies red flags at this visit including bowel or bladder loss, night sweats, chills, fevers, unintentional weight loss.  PMHx - Updated and reviewed.  Contributory factors include: Spondylolisthesis L5-S1 PSHx - Updated and reviewed.  Contributory factors include:  Noncontributory FHx - Updated and reviewed.  Contributory factors include:  Noncontributory Medications - updated and reviewed. Contributory factors are Vicodin when necessary along with Neurontin.   ROS Per HPI.  12 point negative other than per HPI.   Exam:  Filed Vitals:   08/02/15 1338  BP: 140/82   Gen: NAD Cardiorespiratory - Normal respiratory effort/rate.  RRR +SLR B/L LE with radiation.  MS 5/5 B/L LE.  DTR 2/4 B/L LE.  DP/PT 2+ B/L.   Knee: No effusion or erythema.  No TTP medial/lateral joint line.  No crepitus with knee extension.  Negative Meniscal testing.    Imaging:  2 view Lumbar spine  07/24/2015 1. Grade 1 anterolisthesis of L5 which affect S1 secondary to bilateral pars defects and degenerative disc disease at L5-S1 stable since March of 2009. There does not appear to be instability as the patient moves from the neutral to the flexed and then to the extended positions. 2. Stable mild degenerative disc space narrowing at L2-3.

## 2015-08-02 NOTE — Progress Notes (Signed)
Patient ID: Wendy Petty, female   DOB: [birth year], 74 y.o.   MRN: [id number]   Interpreter for visit is [employer]

## 2015-08-07 ENCOUNTER — Ambulatory Visit: Payer: No Typology Code available for payment source | Admitting: Physical Therapy

## 2015-08-07 DIAGNOSIS — M25561 Pain in right knee: Secondary | ICD-10-CM

## 2015-08-07 DIAGNOSIS — R262 Difficulty in walking, not elsewhere classified: Secondary | ICD-10-CM

## 2015-08-07 DIAGNOSIS — M545 Low back pain, unspecified: Secondary | ICD-10-CM

## 2015-08-07 DIAGNOSIS — M25562 Pain in left knee: Principal | ICD-10-CM

## 2015-08-07 NOTE — Patient Instructions (Signed)
Clam Shell 45 Degrees    Acostado con cadera y rodillas flexionadas a 75, una almohada entre las rodillas y tobillos. Eleve la rodilla. Asegrese de que la pelvis no se incline [employer]. No arquee la espalda. Realice [id number] veces, con cada pierna, __2_ veces por da.  http://ss.exer.us/75   Copyright  VHI. All rights reserved.  Pelvic Tilt: Posterior - Legs Bent (Supine)    Contraiga los msculos del Merchant navy officer la espalda sobre la superficie mientras rota la pelvis hacia abajo. Sostenga __10__ segundos. Descanse. Repita __10__ veces por rutina. Realice [id number] rutinas por sesin. Realice [id number] sesiones por da.  http://orth.exer.us/203   Copyright  VHI. All rights reserved.

## 2015-08-07 NOTE — Therapy (Signed)
[place], [place], [address] Phone: [phone]   Fax:  [phone]  Physical Therapy Treatment  Patient Details  Name: Wendy Petty MRN: [id number] Date of Birth: [birth year] Referring Provider: Adrian Blackwater  Encounter Date: 08/07/2015      PT End of Session - 08/07/15 1009    Visit Number 8   Number of Visits 16   PT Start Time 0932   PT Stop Time 1020   PT Time Calculation (min) 48 min   Activity Tolerance Patient tolerated treatment well   Behavior During Therapy [facility] for tasks assessed/performed      Past Medical History  Diagnosis Date  . Hypertension   . GERD (gastroesophageal reflux disease)   . HYPERLIPIDEMIA   . THYROID NODULE   . PULMONARY NODULE   . Edema   . CHEST PAIN UNSPECIFIED   . ANEMIA, CHRONIC   . Hx of cardiovascular stress test     Lexiscan Myoview (11/14):  No ischemia, EF 68% (normal study)    Past Surgical History  Procedure Laterality Date  . Vaginal hysterectomy  1989  . Tubal ligation    . Left heart catheterization with coronary angiogram N/A 09/16/2011    Procedure: LEFT HEART CATHETERIZATION WITH CORONARY ANGIOGRAM;  Surgeon: [name], MD;  Location: [facility] CATH LAB;  Service: Cardiovascular;  Laterality: N/A;    There were no vitals filed for this visit.  Visit Diagnosis:  Arthralgia of both knees  Bilateral low back pain without sciatica  Difficulty walking up stairs  Difficulty walking      Subjective Assessment - 08/07/15 0936    Subjective Saw MD, said therapy will help her leg and back pain.  Had some pain yesterday.     Currently in Pain? No/denies   Pain Location Knee   Pain Score 5   Pain Location Back   Pain Orientation Lower;Right   Pain Descriptors / Indicators --  Warm sensation   Pain Type Chronic pain   Pain Onset More than a month ago   Pain Frequency Intermittent   Aggravating Factors  walking   Pain Relieving Factors  sitting, meds, stretches                         [facility] Adult PT Treatment/Exercise - 08/07/15 0945    Lumbar Exercises: Stretches   Lower Trunk Rotation Limitations x 10    Pelvic Tilt Limitations;Other (comment)   Pelvic Tilt Limitations x 10 PPT with max cueing    Lumbar Exercises: Supine   Ab Set 10 reps   Clam 10 reps   Bent Knee Raise 10 reps   Bridge 10 reps   Knee/Hip Exercises: Aerobic   Nustep 8 min L5 LE only   Moist Heat Therapy   Number Minutes Moist Heat 15 Minutes   Moist Heat Location Lumbar Spine   Electrical Stimulation   Electrical Stimulation Location Lumbar bilat.    Electrical Stimulation Action IFC   Electrical Stimulation Parameters 5   Electrical Stimulation Goals Pain                PT Education - 08/07/15 1009    Education provided Yes   Education Details spondylolistheisis, stab ex   Person(s) Educated Patient   Methods Explanation;Tactile cues;Verbal cues;Handout   Comprehension Verbalized understanding;Need further instruction          PT Short Term Goals - 08/07/15 1003    PT  SHORT TERM GOAL #1   Title Patient will be I with initial HEP    Status Achieved   PT SHORT TERM GOAL #2   Title Pt will be able to tolerate 10 min of standing for housework (light) with min/mod increase in pain (knee/back)   Status Achieved   PT SHORT TERM GOAL #3   Title Pt will be able to step up with Rt. LE onto step with improved confidence, less pain.    Status Achieved   PT SHORT TERM GOAL #4   Title Pt will be able to use 2-3 pain mgmt strategies for home use.    Status Partially Met           PT Long Term Goals - 08/07/15 1004    PT LONG TERM GOAL #1   Title pt will be I with more advanced HEP as of last visit.    Status On-going   PT LONG TERM GOAL #2   Title Pt will return to doing ADLs and housework without limits of pain most days of the week.    Status On-going   PT LONG TERM GOAL #3   Title Pt will be able to  negotiate stairs in community without increase in pain using rails or device.    Baseline sometimes   Status Partially Met   PT LONG TERM GOAL #4   Title Pt will be able to go without cane for communtiy gait (up to 30 min) and min increase in knee/back pain.    Status On-going   PT LONG TERM GOAL #5   Title FOTO score will improve to no more than 45% limited    Status On-going               Plan - 08/07/15 1011    Clinical Impression Statement Patient meeting several goals, states she was barely able to walk when she first started.  She understands that her pain is primarily from her low back.  Has some crepitus in knees.  Weakness in lower abdominals and difficult to understand technique for stabilization at first.     PT Next Visit Plan progress core stabilization exercises as tolerated with emphasis on technique and avoiding compensations.    PT Home Exercise Plan level 1 knee and trunk stretching plus gastroc stretching, gave post pelvic tilt and abdominals, clam   Consulted and Agree with Plan of Care Patient        Problem List Patient Active Problem List   Diagnosis Date Noted  . Spondylolisthesis 07/19/2015  . Right medial knee pain 06/29/2015  . Overweight (BMI 25.0-29.9) 06/29/2015  . Chronic pain of left knee 04/21/2015  . Memory deficit 11/28/2014  . Coronary atherosclerosis of native coronary artery 06/24/2013  . History of TIAs 06/24/2013  . GERD (gastroesophageal reflux disease) 06/24/2013  . Depression 12/31/2012  . CHEST PAIN UNSPECIFIED 02/17/2009  . THYROID NODULE 02/14/2009  . Elevated lipids 02/14/2009  . ANEMIA, CHRONIC 02/14/2009  . Essential hypertension 02/14/2009  . PULMONARY NODULE 02/14/2009    PAA,JENNIFER 08/07/2015, 10:16 AM  [facility] [address] [place], [place], [address] Phone: [phone]   Fax:  [phone]  Name: Wendy Petty MRN: [id number] Date of Birth:  05-Dec-1940    Raeford Razor, PT 08/07/2015 10:16 AM Phone: [phone] Fax: [phone]

## 2015-08-09 ENCOUNTER — Ambulatory Visit: Payer: No Typology Code available for payment source | Admitting: Physical Therapy

## 2015-08-09 DIAGNOSIS — M25561 Pain in right knee: Secondary | ICD-10-CM

## 2015-08-09 DIAGNOSIS — M545 Low back pain, unspecified: Secondary | ICD-10-CM

## 2015-08-09 DIAGNOSIS — R262 Difficulty in walking, not elsewhere classified: Secondary | ICD-10-CM

## 2015-08-09 DIAGNOSIS — M25562 Pain in left knee: Principal | ICD-10-CM

## 2015-08-09 NOTE — Therapy (Signed)
[place], [place], [address] Phone: [phone]   Fax:  [phone]  Physical Therapy Treatment  Patient Details  Name: Wendy Petty MRN: [id number] Date of Birth: 1941/04/29 Referring Provider: Adrian Blackwater  Encounter Date: 08/09/2015      PT End of Session - 08/09/15 1003    Visit Number 9   Number of Visits 16   Date for PT Re-Evaluation 09/07/15   Authorization Type [employer] discount 100%   PT Start Time 0938   PT Stop Time 1028   PT Time Calculation (min) 50 min   Activity Tolerance Patient tolerated treatment well   Behavior During Therapy [facility] for tasks assessed/performed      Past Medical History  Diagnosis Date  . Hypertension   . GERD (gastroesophageal reflux disease)   . HYPERLIPIDEMIA   . THYROID NODULE   . PULMONARY NODULE   . Edema   . CHEST PAIN UNSPECIFIED   . ANEMIA, CHRONIC   . Hx of cardiovascular stress test     Lexiscan Myoview (11/14):  No ischemia, EF 68% (normal study)    Past Surgical History  Procedure Laterality Date  . Vaginal hysterectomy  1989  . Tubal ligation    . Left heart catheterization with coronary angiogram N/A 09/16/2011    Procedure: LEFT HEART CATHETERIZATION WITH CORONARY ANGIOGRAM;  Surgeon: [name], MD;  Location: [facility] CATH LAB;  Service: Cardiovascular;  Laterality: N/A;    There were no vitals filed for this visit.  Visit Diagnosis:  Arthralgia of both knees  Bilateral low back pain without sciatica  Difficulty walking up stairs  Difficulty walking      Subjective Assessment - 08/09/15 1001    Subjective Pain in knee 5/10 and back is 4/10.    Currently in Pain? Yes            [facility] PT Assessment - 08/09/15 1008    Strength   Right Hip ABduction 4+/5   Left Hip ABduction 4-/5             [facility] Adult PT Treatment/Exercise - 08/09/15 0954    Lumbar Exercises: Stretches   Single Knee to Chest Stretch 2 reps;30  seconds   Lower Trunk Rotation Limitations x 10    Lumbar Exercises: Supine   Clam 10 reps   Clam Limitations blue band    Lumbar Exercises: Sidelying   Clam 20 reps   Clam Limitations bilat.    Knee/Hip Exercises: Standing   Heel Raises Both;1 set;20 reps   Hip Flexion Stengthening;Both;2 sets;10 reps   Hip Flexion Limitations march, 1 set on foam, 1 set on tile    Functional Squat 1 set;10 reps   Functional Squat Limitations on foam no UE support   Knee/Hip Exercises: Seated   Long Arc Quad Strengthening;Both;1 set;10 reps   Long Arc Quad Weight 5 lbs.   Long [employer] Limitations 2 sets, 1 with ball squeeze and no weight    Knee/Hip Flexion red band x 10 each side    Knee/Hip Exercises: Supine   Straight Leg Raises Strengthening;Both;1 set;10 reps   Straight Leg Raise with External Rotation Strengthening;Both;1 set;10 reps      MHP lumbar in supine 15 min           PT Education - 08/09/15 1003    Education provided Yes   Education Details balance, heat vs ice and when use   Person(s) Educated Patient   Methods Explanation;Demonstration  Comprehension Verbalized understanding          PT Short Term Goals - 08/07/15 1003    PT SHORT TERM GOAL #1   Title Patient will be I with initial HEP    Status Achieved   PT SHORT TERM GOAL #2   Title Pt will be able to tolerate 10 min of standing for housework (light) with min/mod increase in pain (knee/back)   Status Achieved   PT SHORT TERM GOAL #3   Title Pt will be able to step up with Rt. LE onto step with improved confidence, less pain.    Status Achieved   PT SHORT TERM GOAL #4   Title Pt will be able to use 2-3 pain mgmt strategies for home use.    Status Partially Met           PT Long Term Goals - 08/07/15 1004    PT LONG TERM GOAL #1   Title pt will be I with more advanced HEP as of last visit.    Status On-going   PT LONG TERM GOAL #2   Title Pt will return to doing ADLs and housework without limits  of pain most days of the week.    Status On-going   PT LONG TERM GOAL #3   Title Pt will be able to negotiate stairs in community without increase in pain using rails or device.    Baseline sometimes   Status Partially Met   PT LONG TERM GOAL #4   Title Pt will be able to go without cane for communtiy gait (up to 30 min) and min increase in knee/back pain.    Status On-going   PT LONG TERM GOAL #5   Title FOTO score will improve to no more than 45% limited    Status On-going               Plan - 08/09/15 1013    Clinical Impression Statement Patient appreciative of her progress, she is stronger and has less pain overall.     PT Next Visit Plan progress core stabilization exercises as tolerated with emphasis on technique and avoiding compensations.    PT Home Exercise Plan level 1 knee and trunk stretching plus gastroc stretching, gave post pelvic tilt and abdominals, clam   Consulted and Agree with Plan of Care Patient        Problem List Patient Active Problem List   Diagnosis Date Noted  . Spondylolisthesis 07/19/2015  . Right medial knee pain 06/29/2015  . Overweight (BMI 25.0-29.9) 06/29/2015  . Chronic pain of left knee 04/21/2015  . Memory deficit 11/28/2014  . Coronary atherosclerosis of native coronary artery 06/24/2013  . History of TIAs 06/24/2013  . GERD (gastroesophageal reflux disease) 06/24/2013  . Depression 12/31/2012  . CHEST PAIN UNSPECIFIED 02/17/2009  . THYROID NODULE 02/14/2009  . Elevated lipids 02/14/2009  . ANEMIA, CHRONIC 02/14/2009  . Essential hypertension 02/14/2009  . PULMONARY NODULE 02/14/2009    PAA,JENNIFER 08/09/2015, 10:15 AM  [facility] [address] [place], [place], [address] Phone: [phone]   Fax:  [phone]  Name: Wendy Petty MRN: [id number] Date of Birth: 1940-09-17  Raeford Razor, PT 08/09/2015 10:18 AM Phone: [phone] Fax: [phone]

## 2015-08-10 ENCOUNTER — Encounter: Payer: No Typology Code available for payment source | Admitting: Physical Therapy

## 2015-08-15 ENCOUNTER — Ambulatory Visit: Payer: No Typology Code available for payment source | Attending: Family Medicine | Admitting: Physical Therapy

## 2015-08-15 DIAGNOSIS — M25562 Pain in left knee: Secondary | ICD-10-CM | POA: Insufficient documentation

## 2015-08-15 DIAGNOSIS — M545 Low back pain, unspecified: Secondary | ICD-10-CM

## 2015-08-15 DIAGNOSIS — R262 Difficulty in walking, not elsewhere classified: Secondary | ICD-10-CM | POA: Insufficient documentation

## 2015-08-15 DIAGNOSIS — M25561 Pain in right knee: Secondary | ICD-10-CM | POA: Insufficient documentation

## 2015-08-15 NOTE — Therapy (Signed)
[place], [place], [address] Phone: [phone]   Fax:  [phone]  Physical Therapy Treatment  Patient Details  Name: Wendy Petty MRN: [id number] Date of Birth: Jul 08, 1941 Referring Provider: Adrian Blackwater  Encounter Date: 08/15/2015      PT End of Session - 08/15/15 0937    Visit Number 10   Number of Visits 16   Date for PT Re-Evaluation 09/07/15   PT Start Time 0933   PT Stop Time 1026   PT Time Calculation (min) 53 min   Activity Tolerance Patient tolerated treatment well   Behavior During Therapy [facility] for tasks assessed/performed      Past Medical History  Diagnosis Date  . Hypertension   . GERD (gastroesophageal reflux disease)   . HYPERLIPIDEMIA   . THYROID NODULE   . PULMONARY NODULE   . Edema   . CHEST PAIN UNSPECIFIED   . ANEMIA, CHRONIC   . Hx of cardiovascular stress test     Lexiscan Myoview (11/14):  No ischemia, EF 68% (normal study)    Past Surgical History  Procedure Laterality Date  . Vaginal hysterectomy  1989  . Tubal ligation    . Left heart catheterization with coronary angiogram N/A 09/16/2011    Procedure: LEFT HEART CATHETERIZATION WITH CORONARY ANGIOGRAM;  Surgeon: [name], MD;  Location: [facility] CATH LAB;  Service: Cardiovascular;  Laterality: N/A;    There were no vitals filed for this visit.  Visit Diagnosis:  Arthralgia of both knees  Bilateral low back pain without sciatica  Difficulty walking up stairs  Difficulty walking      Subjective Assessment - 08/15/15 0937    Subjective No pain today, I keep moving at home, I walk, I am so happy. Mild tightness in knees.    Currently in Pain? No/denies                         [facility] Adult PT Treatment/Exercise - 08/15/15 0942    Lumbar Exercises: Stretches   Active Hamstring Stretch 2 reps;30 seconds  with sheet and ample VC/TC for straightening of knee   Single Knee to Chest  Stretch 2 reps;30 seconds   Lower Trunk Rotation 10 seconds   Lower Trunk Rotation Limitations x 10   Knee/Hip Exercises: Aerobic   Nustep 8 min Level 5, mod pace   Knee/Hip Exercises: Standing   Heel Raises Both;1 set;20 reps   Heel Raises Limitations squeezing ball at ankle   Side Lunges Both;3 sets;10 reps   Side Lunges Limitations red band    Wall Squat 10 reps   Wall Squat Limitations 5 sec hold   Other Standing Knee Exercises hip ext x 10 red band and hip abd x 10 red band, each bilat.    Moist Heat Therapy   Number Minutes Moist Heat 15 Minutes   Moist Heat Location Lumbar Spine   Electrical Stimulation   Electrical Stimulation Location Lumbar bilat.   Rt. side   Electrical Stimulation Action IFC   Electrical Stimulation Parameters 7   Electrical Stimulation Goals Pain                PT Education - 08/15/15 1249    Education provided No          PT Short Term Goals - 08/15/15 0955    PT SHORT TERM GOAL #1   Title Patient will be I with initial HEP  Status Achieved   PT SHORT TERM GOAL #2   Title Pt will be able to tolerate 10 min of standing for housework (light) with min/mod increase in pain (knee/back)   Status Achieved   PT SHORT TERM GOAL #3   Title Pt will be able to step up with Rt. LE onto step with improved confidence, less pain.    Status Achieved   PT SHORT TERM GOAL #4   Title Pt will be able to use 2-3 pain mgmt strategies for home use.    Status Achieved           PT Long Term Goals - 08/15/15 9741    PT LONG TERM GOAL #1   Title pt will be I with more advanced HEP as of last visit.    Status On-going   PT LONG TERM GOAL #2   Title Pt will return to doing ADLs and housework without limits of pain most days of the week.    Baseline the only thing I cannot do well is take my bath, I have to stand and her legs are weak    Status Achieved   PT LONG TERM GOAL #3   Title Pt will be able to negotiate stairs in community without  increase in pain using rails or device.    Status Partially Met   PT LONG TERM GOAL #4   Title Pt will be able to go without cane for communtiy gait (up to 30 min) and min increase in knee/back pain.    Status Achieved   PT LONG TERM GOAL #5   Title FOTO score will improve to no more than 45% limited    Baseline 08/15/15, 64%   Status On-going               Plan - 08/15/15 1249    Clinical Impression Statement Patient progressing towards goals, feels intermittent weakness which limits her ability to complete ADLs. Otherwise, has stopped using her cane, and can do light housework without much pain.  FOTO improved 10%   PT Next Visit Plan progress core stabilization exercises as tolerated with emphasis on technique and avoiding compensations.    PT Home Exercise Plan level 1 knee and trunk stretching plus gastroc stretching, gave post pelvic tilt and abdominals, clam   Consulted and Agree with Plan of Care Patient        Problem List Patient Active Problem List   Diagnosis Date Noted  . Spondylolisthesis 07/19/2015  . Right medial knee pain 06/29/2015  . Overweight (BMI 25.0-29.9) 06/29/2015  . Chronic pain of left knee 04/21/2015  . Memory deficit 11/28/2014  . Coronary atherosclerosis of native coronary artery 06/24/2013  . History of TIAs 06/24/2013  . GERD (gastroesophageal reflux disease) 06/24/2013  . Depression 12/31/2012  . CHEST PAIN UNSPECIFIED 02/17/2009  . THYROID NODULE 02/14/2009  . Elevated lipids 02/14/2009  . ANEMIA, CHRONIC 02/14/2009  . Essential hypertension 02/14/2009  . PULMONARY NODULE 02/14/2009    Garmon Dehn 08/15/2015, 12:53 PM  [facility] [address] [place], [place], [address] Phone: [phone]   Fax:  [phone]  Name: Tanyia Grabbe MRN: [id number] Date of Birth: Jul 19, 1941    Raeford Razor, PT 08/15/2015 12:57 PM Phone: [phone] Fax: [phone]

## 2015-08-18 ENCOUNTER — Ambulatory Visit: Payer: No Typology Code available for payment source | Admitting: Physical Therapy

## 2015-08-22 ENCOUNTER — Ambulatory Visit: Payer: No Typology Code available for payment source | Admitting: Physical Therapy

## 2015-08-22 DIAGNOSIS — M545 Low back pain, unspecified: Secondary | ICD-10-CM

## 2015-08-22 DIAGNOSIS — R262 Difficulty in walking, not elsewhere classified: Secondary | ICD-10-CM

## 2015-08-22 DIAGNOSIS — M25562 Pain in left knee: Principal | ICD-10-CM

## 2015-08-22 DIAGNOSIS — M25561 Pain in right knee: Secondary | ICD-10-CM

## 2015-08-22 NOTE — Therapy (Signed)
[place], [place], [address] Phone: [phone]   Fax:  [phone]  Physical Therapy Treatment  Patient Details  Name: Wendy Petty MRN: [id number] Date of Birth: Nov 02, 1940 Referring Provider: Adrian Blackwater  Encounter Date: 08/22/2015      PT End of Session - 08/22/15 1044    Visit Number 11   Number of Visits 16   Date for PT Re-Evaluation 09/07/15   PT Start Time 1020   PT Stop Time 1120   PT Time Calculation (min) 60 min   Activity Tolerance Patient tolerated treatment well   Behavior During Therapy [facility] for tasks assessed/performed      Past Medical History  Diagnosis Date  . Hypertension   . GERD (gastroesophageal reflux disease)   . HYPERLIPIDEMIA   . THYROID NODULE   . PULMONARY NODULE   . Edema   . CHEST PAIN UNSPECIFIED   . ANEMIA, CHRONIC   . Hx of cardiovascular stress test     Lexiscan Myoview (11/14):  No ischemia, EF 68% (normal study)    Past Surgical History  Procedure Laterality Date  . Vaginal hysterectomy  1989  . Tubal ligation    . Left heart catheterization with coronary angiogram N/A 09/16/2011    Procedure: LEFT HEART CATHETERIZATION WITH CORONARY ANGIOGRAM;  Surgeon: [name], MD;  Location: [facility] CATH LAB;  Service: Cardiovascular;  Laterality: N/A;    There were no vitals filed for this visit.  Visit Diagnosis:  Arthralgia of both knees  Bilateral low back pain without sciatica  Difficulty walking up stairs  Difficulty walking      Subjective Assessment - 08/22/15 1304    Subjective Pt has bilateral leg pain (thighs), min medial knee pain.     Currently in Pain? Yes   Pain Score 6    Pain Location Leg   Pain Orientation Right;Left;Anterior;Lateral   Pain Descriptors / Indicators Sore  fatigue   Pain Type Chronic pain            [facility] Adult PT Treatment/Exercise - 08/22/15 1032    Lumbar Exercises: Stretches   Single Knee to Chest  Stretch 3 reps;30 seconds   Lower Trunk Rotation 10 seconds   Lower Trunk Rotation Limitations x 10   Piriformis Stretch 2 reps;30 seconds   Piriformis Stretch Limitations knee to opp shoulder    Lumbar Exercises: Supine   Bridge 10 reps   Knee/Hip Exercises: Aerobic   Nustep 8 min Level 5, mod pace   Knee/Hip Exercises: Standing   Hip Extension Stengthening;Left;1 set;20 reps;Knee bent   Extension Limitations x 10    Forward Step Up Both;1 set;20 reps;Hand Hold: 1;Step Height: 6"   Other Standing Knee Exercises step squat x 10 each side 2 inch step    Knee/Hip Exercises: Supine   Bridges Limitations x 10 x 2 sets    Moist Heat Therapy   Number Minutes Moist Heat 15 Minutes   Moist Heat Location Lumbar Spine  prone   Manual Therapy   Manual Therapy Soft tissue mobilization;Myofascial release   Soft tissue mobilization bilateral lumbar paraspinals and into Rt. glute med   Myofascial Release low back                 PT Education - 08/22/15 1305    Education provided Yes   Education Details avoiding sleeping in prone for her  back pain , preferred to choose sidelying with pillows   Person(s) Educated Patient  Methods Explanation;Demonstration   Comprehension Verbalized understanding;Returned demonstration          PT Short Term Goals - 08/15/15 0955    PT SHORT TERM GOAL #1   Title Patient will be I with initial HEP    Status Achieved   PT SHORT TERM GOAL #2   Title Pt will be able to tolerate 10 min of standing for housework (light) with min/mod increase in pain (knee/back)   Status Achieved   PT SHORT TERM GOAL #3   Title Pt will be able to step up with Rt. LE onto step with improved confidence, less pain.    Status Achieved   PT SHORT TERM GOAL #4   Title Pt will be able to use 2-3 pain mgmt strategies for home use.    Status Achieved           PT Long Term Goals - 08/15/15 7741    PT LONG TERM GOAL #1   Title pt will be I with more advanced HEP as  of last visit.    Status On-going   PT LONG TERM GOAL #2   Title Pt will return to doing ADLs and housework without limits of pain most days of the week.    Baseline the only thing I cannot do well is take my bath, I have to stand and her legs are weak    Status Achieved   PT LONG TERM GOAL #3   Title Pt will be able to negotiate stairs in community without increase in pain using rails or device.    Status Partially Met   PT LONG TERM GOAL #4   Title Pt will be able to go without cane for communtiy gait (up to 30 min) and min increase in knee/back pain.    Status Achieved   PT LONG TERM GOAL #5   Title FOTO score will improve to no more than 45% limited    Baseline 08/15/15, 64%   Status On-going               Plan - 08/22/15 1305    Clinical Impression Statement Pt able to do leg strengthening today without increasing back pain.  Bridging for 2nd set caused "fire" in Rt. buttock.  Responded well to soft tissue massage to that area post exercise.    PT Next Visit Plan check goals, prep for possible DC   PT Home Exercise Plan level 1 knee and trunk stretching plus gastroc stretching, gave post pelvic tilt and abdominals, clam   Consulted and Agree with Plan of Care Patient        Problem List Patient Active Problem List   Diagnosis Date Noted  . Spondylolisthesis 07/19/2015  . Right medial knee pain 06/29/2015  . Overweight (BMI 25.0-29.9) 06/29/2015  . Chronic pain of left knee 04/21/2015  . Memory deficit 11/28/2014  . Coronary atherosclerosis of native coronary artery 06/24/2013  . History of TIAs 06/24/2013  . GERD (gastroesophageal reflux disease) 06/24/2013  . Depression 12/31/2012  . CHEST PAIN UNSPECIFIED 02/17/2009  . THYROID NODULE 02/14/2009  . Elevated lipids 02/14/2009  . ANEMIA, CHRONIC 02/14/2009  . Essential hypertension 02/14/2009  . PULMONARY NODULE 02/14/2009    Wendy Petty 08/22/2015, 1:10 PM  [facility] [address] [place], [place], [address] Phone: [phone]   Fax:  [phone]  Name: Wendy Petty MRN: [id number] Date of Birth: [birth year]    Raeford Razor, PT 08/22/2015 1:11 PM Phone: [phone] Fax:  [phone]  

## 2015-08-25 ENCOUNTER — Telehealth: Payer: Self-pay | Admitting: Family Medicine

## 2015-08-25 ENCOUNTER — Ambulatory Visit: Payer: No Typology Code available for payment source | Admitting: Physical Therapy

## 2015-08-25 DIAGNOSIS — M545 Low back pain, unspecified: Secondary | ICD-10-CM

## 2015-08-25 DIAGNOSIS — M25561 Pain in right knee: Secondary | ICD-10-CM

## 2015-08-25 DIAGNOSIS — M25562 Pain in left knee: Principal | ICD-10-CM

## 2015-08-25 DIAGNOSIS — R262 Difficulty in walking, not elsewhere classified: Secondary | ICD-10-CM

## 2015-08-25 NOTE — Therapy (Signed)
Wendy Petty, [place], [address] Phone: [phone]   Fax:  [phone]  Physical Therapy Treatment  Patient Details  Name: Wendy Petty MRN: [id number] Date of Birth: 02/18/1941 Referring Provider: Adrian Blackwater  Encounter Date: 08/25/2015      PT End of Session - 08/25/15 1007    Visit Number 12   Number of Visits 16   Date for PT Re-Evaluation 09/07/15   PT Start Time 0933   PT Stop Time 1015   PT Time Calculation (min) 42 min   Activity Tolerance Patient tolerated treatment well   Behavior During Therapy Wendy Petty for tasks assessed/performed      Past Medical History  Diagnosis Date  . Hypertension   . GERD (gastroesophageal reflux disease)   . HYPERLIPIDEMIA   . THYROID NODULE   . PULMONARY NODULE   . Edema   . CHEST PAIN UNSPECIFIED   . ANEMIA, CHRONIC   . Hx of cardiovascular stress test     Lexiscan Myoview (11/14):  No ischemia, EF 68% (normal study)    Past Surgical History  Procedure Laterality Date  . Vaginal hysterectomy  1989  . Tubal ligation    . Left heart catheterization with coronary angiogram N/A 09/16/2011    Procedure: LEFT HEART CATHETERIZATION WITH CORONARY ANGIOGRAM;  Surgeon: [name], MD;  Location: [facility] CATH LAB;  Service: Cardiovascular;  Laterality: N/A;    There were no vitals filed for this visit.  Visit Diagnosis:  Arthralgia of both knees  Bilateral low back pain without sciatica  Difficulty walking up stairs  Difficulty walking      Subjective Assessment - 08/25/15 0951    Subjective Min knee pain today, no back pain.    Currently in Pain? Yes   Pain Score 2    Pain Location Knee   Pain Orientation Right;Left;Anterior;Lateral   Pain Descriptors / Indicators Sore   Pain Type Chronic pain   Pain Onset More than a month ago   Pain Frequency Intermittent   Aggravating Factors  Weightbearing   Pain Relieving Factors rest                          [facility] Adult PT Treatment/Exercise - 08/25/15 0944    Knee/Hip Exercises: Aerobic   Nustep 6 min NuStep Level 8   Knee/Hip Exercises: Supine   Quad Sets Strengthening;Both;1 set;10 reps   Bridges Limitations x 10 x 2 sets    Single Leg Bridge Strengthening;Both;1 set;10 reps   Straight Leg Raises Strengthening;Both;1 set;10 reps   Straight Leg Raise with External Rotation Strengthening;Both;1 set;10 reps   Moist Heat Therapy   Number Minutes Moist Heat 15 Minutes   Moist Heat Location Lumbar Spine  prone   Manual Therapy   Manual Therapy Soft tissue mobilization;Myofascial release   Soft tissue mobilization bilateral lumbar paraspinals and into Rt. glute med   Myofascial Release low back                 PT Education - 08/25/15 1007    Education provided No          PT Short Term Goals - 08/15/15 0955    PT SHORT TERM GOAL #1   Title Patient will be I with initial HEP    Status Achieved   PT SHORT TERM GOAL #2   Title Pt will be able to tolerate 10 min of standing for housework (light) with min/mod  increase in pain (knee/back)   Status Achieved   PT SHORT TERM GOAL #3   Title Pt will be able to step up with Rt. LE onto step with improved confidence, less pain.    Status Achieved   PT SHORT TERM GOAL #4   Title Pt will be able to use 2-3 pain mgmt strategies for home use.    Status Achieved           PT Long Term Goals - 08/15/15 1610    PT LONG TERM GOAL #1   Title pt will be I with more advanced HEP as of last visit.    Status On-going   PT LONG TERM GOAL #2   Title Pt will return to doing ADLs and housework without limits of pain most days of the week.    Baseline the only thing I cannot do well is take my bath, I have to stand and her legs are weak    Status Achieved   PT LONG TERM GOAL #3   Title Pt will be able to negotiate stairs in community without increase in pain using rails or device.    Status Partially  Met   PT LONG TERM GOAL #4   Title Pt will be able to go without cane for communtiy gait (up to 30 min) and min increase in knee/back pain.    Status Achieved   PT LONG TERM GOAL #5   Title FOTO score will improve to no more than 45% limited    Baseline 08/15/15, 64%   Status On-going               Plan - 08/25/15 1009    Clinical Impression Statement Responded well to manual/soft tissue work. Requested shorter session (son) . No pain with bridging today    PT Next Visit Plan progress strength in LE and core    PT Home Exercise Plan level 1 knee and trunk stretching plus gastroc stretching, gave post pelvic tilt and abdominals, clam   Consulted and Agree with Plan of Care Patient        Problem List Patient Active Problem List   Diagnosis Date Noted  . Spondylolisthesis 07/19/2015  . Right medial knee pain 06/29/2015  . Overweight (BMI 25.0-29.9) 06/29/2015  . Chronic pain of left knee 04/21/2015  . Memory deficit 11/28/2014  . Coronary atherosclerosis of native coronary artery 06/24/2013  . History of TIAs 06/24/2013  . GERD (gastroesophageal reflux disease) 06/24/2013  . Depression 12/31/2012  . CHEST PAIN UNSPECIFIED 02/17/2009  . THYROID NODULE 02/14/2009  . Elevated lipids 02/14/2009  . ANEMIA, CHRONIC 02/14/2009  . Essential hypertension 02/14/2009  . PULMONARY NODULE 02/14/2009    Wendy Petty 08/25/2015, 10:14 AM  [place], [place], [address] Phone: [phone]   Fax:  [phone]  Name: Wendy Petty MRN: [id number] Date of Birth: 01/02/1941    Raeford Razor, PT 08/25/2015 10:14 AM Phone: [phone] Fax: [phone]

## 2015-08-25 NOTE — Telephone Encounter (Signed)
amLODipine (NORVASC) 10 MG tablet atorvastatin (LIPITOR) 40 MG tablet levothyroxine (SYNTHROID) 25 MCG tablet

## 2015-08-28 ENCOUNTER — Ambulatory Visit: Payer: No Typology Code available for payment source | Admitting: Physical Therapy

## 2015-08-28 DIAGNOSIS — M545 Low back pain, unspecified: Secondary | ICD-10-CM

## 2015-08-28 DIAGNOSIS — M25562 Pain in left knee: Principal | ICD-10-CM

## 2015-08-28 DIAGNOSIS — M25561 Pain in right knee: Secondary | ICD-10-CM

## 2015-08-28 DIAGNOSIS — R262 Difficulty in walking, not elsewhere classified: Secondary | ICD-10-CM

## 2015-08-28 NOTE — Therapy (Signed)
[place], [place], [address] Phone: [phone]   Fax:  [phone]  Physical Therapy Treatment  Patient Details  Name: Wendy Petty MRN: [id number] Date of Birth: February 13, 1941 Referring Provider: Adrian Blackwater  Encounter Date: 08/28/2015      PT End of Session - 08/28/15 1004    Visit Number 13   Number of Visits 16   Date for PT Re-Evaluation 09/07/15   PT Start Time 0945  15 min late   PT Stop Time 1030   PT Time Calculation (min) 45 min   Activity Tolerance Patient tolerated treatment well   Behavior During Therapy [facility] for tasks assessed/performed      Past Medical History  Diagnosis Date  . Hypertension   . GERD (gastroesophageal reflux disease)   . HYPERLIPIDEMIA   . THYROID NODULE   . PULMONARY NODULE   . Edema   . CHEST PAIN UNSPECIFIED   . ANEMIA, CHRONIC   . Hx of cardiovascular stress test     Lexiscan Myoview (11/14):  No ischemia, EF 68% (normal study)    Past Surgical History  Procedure Laterality Date  . Vaginal hysterectomy  1989  . Tubal ligation    . Left heart catheterization with coronary angiogram N/A 09/16/2011    Procedure: LEFT HEART CATHETERIZATION WITH CORONARY ANGIOGRAM;  Surgeon: [name], MD;  Location: [facility] CATH LAB;  Service: Cardiovascular;  Laterality: N/A;    There were no vitals filed for this visit.  Visit Diagnosis:  Arthralgia of both knees  Bilateral low back pain without sciatica  Difficulty walking up stairs  Difficulty walking      Subjective Assessment - 08/28/15 0947    Subjective My knee pain is always there. Pt 15 min late.    Currently in Pain? Yes   Pain Score 4    Pain Location Knee   Pain Orientation Right  Rt more than L    Pain Type Chronic pain   Pain Onset More than a month ago   Pain Frequency Intermittent   Pain Score 4   Pain Location Back   Pain Orientation Right;Lower   Pain Descriptors / Indicators  Burning   Pain Type Chronic pain   Pain Onset More than a month ago   Pain Frequency Intermittent                         [facility] Adult PT Treatment/Exercise - 08/28/15 0949    Lumbar Exercises: Stretches   Single Knee to Chest Stretch 2 reps   Lower Trunk Rotation 10 seconds   Lower Trunk Rotation Limitations x 10    Piriformis Stretch 2 reps;30 seconds   Piriformis Stretch Limitations knee to opp shoulder    Lumbar Exercises: Supine   Clam 10 reps   Clam Limitations blue band    Bent Knee Raise 10 reps   Bridge 10 reps   Bridge Limitations 2 sets with ball    Knee/Hip Exercises: Seated   Long Arc Quad Strengthening;Both;1 set;10 reps;Weights   Long Arc Quad Weight 5 lbs.   Long [employer] Limitations used ball between knees   Sit to [employer] 1 set;10 reps;with UE support;Other (comment)  handheld steadying assist   Moist Heat Therapy   Number Minutes Moist Heat 15 Minutes   Moist Heat Location Lumbar Spine;Knee   Manual Therapy   Manual Therapy Soft tissue mobilization   Soft tissue mobilization Rt. knee, quads  and distal ITB                PT Education - 08/28/15 1206    Education provided Yes   Education Details chest pain in women and need to see MD   Person(s) Educated Patient   Methods Explanation   Comprehension Verbalized understanding          PT Short Term Goals - 08/15/15 0955    PT SHORT TERM GOAL #1   Title Patient will be I with initial HEP    Status Achieved   PT SHORT TERM GOAL #2   Title Pt will be able to tolerate 10 min of standing for housework (light) with min/mod increase in pain (knee/back)   Status Achieved   PT SHORT TERM GOAL #3   Title Pt will be able to step up with Rt. LE onto step with improved confidence, less pain.    Status Achieved   PT SHORT TERM GOAL #4   Title Pt will be able to use 2-3 pain mgmt strategies for home use.    Status Achieved           PT Long Term Goals - 08/15/15 5027    PT LONG  TERM GOAL #1   Title pt will be I with more advanced HEP as of last visit.    Status On-going   PT LONG TERM GOAL #2   Title Pt will return to doing ADLs and housework without limits of pain most days of the week.    Baseline the only thing I cannot do well is take my bath, I have to stand and her legs are weak    Status Achieved   PT LONG TERM GOAL #3   Title Pt will be able to negotiate stairs in community without increase in pain using rails or device.    Status Partially Met   PT LONG TERM GOAL #4   Title Pt will be able to go without cane for communtiy gait (up to 30 min) and min increase in knee/back pain.    Status Achieved   PT LONG TERM GOAL #5   Title FOTO score will improve to no more than 45% limited    Baseline 08/15/15, 64%   Status On-going               Plan - 08/28/15 1006    Clinical Impression Statement PT late and had some questions related to symptoms she has other than pain: fatigue, reflux, chest pain.  None in clinic, rec she see cardiologist prior to her appt in March. SAys the only thing she has trouble with is ability to negotiate stairs.  Feels weak>pain.    PT Next Visit Plan progress strength in LE and core , consider renewal for 2-4 more weeks in jan    PT Home Exercise Plan level 1 knee and trunk stretching plus gastroc stretching, gave post pelvic tilt and abdominals, clam   Consulted and Agree with Plan of Care Patient        Problem List Patient Active Problem List   Diagnosis Date Noted  . Spondylolisthesis 07/19/2015  . Right medial knee pain 06/29/2015  . Overweight (BMI 25.0-29.9) 06/29/2015  . Chronic pain of left knee 04/21/2015  . Memory deficit 11/28/2014  . Coronary atherosclerosis of native coronary artery 06/24/2013  . History of TIAs 06/24/2013  . GERD (gastroesophageal reflux disease) 06/24/2013  . Depression 12/31/2012  . CHEST PAIN UNSPECIFIED 02/17/2009  . THYROID NODULE  02/14/2009  . Elevated lipids 02/14/2009  .  ANEMIA, CHRONIC 02/14/2009  . Essential hypertension 02/14/2009  . PULMONARY NODULE 02/14/2009    Wendy Petty 08/28/2015, 12:12 PM  [facility] [address] [place], [place], [address] Phone: [phone]   Fax:  [phone]  Name: Wendy Petty MRN: [id number] Date of Birth: [birth year]    Raeford Razor, PT 08/28/2015 12:12 PM Phone: [phone] Fax: [phone]

## 2015-08-28 NOTE — Telephone Encounter (Signed)
Patient called and requested a med refill for   Amlodipine Lipitor Levothyroxine  Please f/u

## 2015-08-30 ENCOUNTER — Other Ambulatory Visit: Payer: Self-pay | Admitting: Family Medicine

## 2015-08-30 NOTE — Telephone Encounter (Signed)
Pt. Called requesting a med refill on the following medications:  Amlodipine Lipitor Levothyroxine  Please f/u

## 2015-08-31 NOTE — Telephone Encounter (Signed)
Patient called and requested a med refill for   Amlodipine Lipitor Levothyroxine  Please f/u

## 2015-09-01 ENCOUNTER — Other Ambulatory Visit: Payer: Self-pay | Admitting: Pharmacist

## 2015-09-01 ENCOUNTER — Ambulatory Visit: Payer: No Typology Code available for payment source | Admitting: Physical Therapy

## 2015-09-01 DIAGNOSIS — E038 Other specified hypothyroidism: Secondary | ICD-10-CM

## 2015-09-01 DIAGNOSIS — M545 Low back pain, unspecified: Secondary | ICD-10-CM

## 2015-09-01 DIAGNOSIS — M25562 Pain in left knee: Principal | ICD-10-CM

## 2015-09-01 DIAGNOSIS — I10 Essential (primary) hypertension: Secondary | ICD-10-CM

## 2015-09-01 DIAGNOSIS — M25561 Pain in right knee: Secondary | ICD-10-CM

## 2015-09-01 DIAGNOSIS — R262 Difficulty in walking, not elsewhere classified: Secondary | ICD-10-CM

## 2015-09-01 MED ORDER — LEVOTHYROXINE SODIUM 25 MCG PO TABS: ORAL_TABLET | ORAL | Status: DC

## 2015-09-01 MED ORDER — AMLODIPINE BESYLATE 10 MG PO TABS: 10.0000 mg | ORAL_TABLET | Freq: Every day | ORAL | Status: DC

## 2015-09-01 NOTE — Telephone Encounter (Signed)
Received call from front desk that patient was present and requesting refills.  Patient is requesting levothyroxine, carvedilol, amlodipine, and atorvastatin.  Refilled levothyroxine and amlodipine. Atorvastatin and carvedilol are not due for refills yet.    Nicoletta Ba, PharmD, BCPS, [facility] [phone]

## 2015-09-01 NOTE — Patient Instructions (Signed)
Abduction: Side Leg Lift (Eccentric) - Side-Lying    Lie on side. Lift top leg slightly higher than shoulder level. Keep top leg straight with body, toes pointing forward. Slowly lower for 3-5 seconds. __10-20_ reps per set, __1 sets per day, __3-5_ days per week.  http://ecce.exer.us/63   Copyright  VHI. All rights reserved.

## 2015-09-01 NOTE — Therapy (Signed)
[place], [place], [address] Phone: [phone]   Fax:  [phone]  Physical Therapy Treatment/RENEWAL  Patient Details  Name: Wendy Petty MRN: [id number] Date of Birth: 1940/09/29 Referring Provider: Adrian Blackwater  Encounter Date: 09/01/2015      PT End of Session - 09/01/15 1038    Visit Number 14   Number of Visits 21   Date for PT Re-Evaluation 09/29/15   PT Start Time 0942   PT Stop Time 1032   PT Time Calculation (min) 50 min   Activity Tolerance Patient tolerated treatment well   Behavior During Therapy [facility] for tasks assessed/performed      Past Medical History  Diagnosis Date  . Hypertension   . GERD (gastroesophageal reflux disease)   . HYPERLIPIDEMIA   . THYROID NODULE   . PULMONARY NODULE   . Edema   . CHEST PAIN UNSPECIFIED   . ANEMIA, CHRONIC   . Hx of cardiovascular stress test     Lexiscan Myoview (11/14):  No ischemia, EF 68% (normal study)    Past Surgical History  Procedure Laterality Date  . Vaginal hysterectomy  1989  . Tubal ligation    . Left heart catheterization with coronary angiogram N/A 09/16/2011    Procedure: LEFT HEART CATHETERIZATION WITH CORONARY ANGIOGRAM;  Surgeon: [name], MD;  Location: [facility] CATH LAB;  Service: Cardiovascular;  Laterality: N/A;    There were no vitals filed for this visit.  Visit Diagnosis:  Arthralgia of both knees  Bilateral low back pain without sciatica  Difficulty walking up stairs  Difficulty walking      Subjective Assessment - 09/01/15 0948    Subjective Yesterday pain was very bad, exercises helped.  She is OK today, legs hurt.     Patient is accompained by: Interpreter   Limitations Standing;Walking;House hold activities   Currently in Pain? Yes   Pain Score 3    Pain Location Back   Pain Orientation Right   Pain Descriptors / Indicators Burning;Sore   Pain Type Chronic pain   Pain Radiating Towards  legs, bilat.    Pain Onset More than a month ago   Pain Frequency Intermittent   Aggravating Factors  walking, bending FW< heavier housework   Pain Relieving Factors rest, heat, stretching    Effect of Pain on Daily Activities less mobile   Multiple Pain Sites Yes   Pain Score 2   Pain Location Knee   Pain Orientation Right;Left   Pain Descriptors / Indicators Aching   Pain Type Chronic pain   Pain Onset More than a month ago   Pain Frequency Intermittent   Aggravating Factors  walking, stairs   Pain Relieving Factors rest, heat            [facility] PT Assessment - 09/01/15 0951    AROM   Right Knee Extension 0   Right Knee Flexion 130   Left Knee Extension 0   Left Knee Flexion 130   Lumbar Flexion painful, 25% limited   Lumbar Extension "pop", 50% limited   Lumbar - Right Side Bend no pain, WFL   Lumbar - Left Side Bend pain on Rt, WFL   Lumbar - Right Rotation WFL no pain   Lumbar - Left Rotation WFL no pain    Strength   Right Hip Flexion 4/5   Left Hip Flexion 4/5   Right Knee Flexion 4+/5   Right Knee Extension 4+/5   Left Knee  Flexion 4+/5   Left Knee Extension 4+/5   Right/Left Ankle --  WNL    Ambulation/Gait   Gait Comments no longer walking with cane            [facility] Adult PT Treatment/Exercise - 09/01/15 1006    Self-Care   Self-Care Posture;Other Self-Care Comments   Posture sitting, standing   Other Self-Care Comments  anatomy, spondylolisthesis    Lumbar Exercises: Stretches   Single Knee to Chest Stretch 3 reps;30 seconds   Lower Trunk Rotation 10 seconds   Lower Trunk Rotation Limitations x 10    Pelvic Tilt 5 reps;10 seconds   Knee/Hip Exercises: Supine   Straight Leg Raises Strengthening;Both;1 set;10 reps   Knee/Hip Exercises: Sidelying   Hip ABduction Strengthening;Both;1 set;15 reps     MHP to Rt. Hip/Lumbar 15 min            PT Education - 09/01/15 1037    Education provided Yes   Education Details HEP for hip abd,  spondylolisthesis, progress and renewal    Person(s) Educated Patient   Methods Explanation;Demonstration;Handout;Verbal cues   Comprehension Verbalized understanding;Returned demonstration          PT Short Term Goals - 08/15/15 0955    PT SHORT TERM GOAL #1   Title Patient will be I with initial HEP    Status Achieved   PT SHORT TERM GOAL #2   Title Pt will be able to tolerate 10 min of standing for housework (light) with min/mod increase in pain (knee/back)   Status Achieved   PT SHORT TERM GOAL #3   Title Pt will be able to step up with Rt. LE onto step with improved confidence, less pain.    Status Achieved   PT SHORT TERM GOAL #4   Title Pt will be able to use 2-3 pain mgmt strategies for home use.    Status Achieved           PT Long Term Goals - 09/01/15 1001    PT LONG TERM GOAL #1   Title pt will be I with more advanced HEP as of last visit.    Status On-going   PT LONG TERM GOAL #2   Title Pt will return to doing ADLs and housework without limits of pain most days of the week.    Status On-going   PT LONG TERM GOAL #3   Title Pt will be able to negotiate stairs in community without increase in pain using rails or device.    Status Partially Met   PT LONG TERM GOAL #4   Title Pt will be able to go without cane for communtiy gait (up to 30 min) and min increase in knee/back pain.    Baseline not painful in knees but low    Status Partially Met   PT LONG TERM GOAL #5   Title FOTO score will improve to no more than 45% limited    Baseline 08/15/15, 64%   Status On-going               Plan - 09/01/15 1039    Clinical Impression Statement Wendy Petty has met several LTGs but cont to have pain in back, LE with walking and she lacks confidence and strength with stairs.  She will beneft from continued PT to improve mobility and spinal stability.  SHe also complains of LE swelling midday.  She has mentioned chest pain, rec she followup with cardiologist  sooner than her appt in  March. She has min knee pain but bilat. LE pain is referred from [facility].    Pt will benefit from skilled therapeutic intervention in order to improve on the following deficits Abnormal gait;Decreased strength;Increased muscle spasms;Improper body mechanics;Difficulty walking;Decreased knowledge of use of DME;Decreased activity tolerance;Decreased mobility;Impaired flexibility;Decreased range of motion;Decreased balance;Pain;Obesity;Impaired UE functional use;Increased edema;Decreased endurance;Increased fascial restricitons   PT Frequency 2x / week   PT Duration 4 weeks   PT Treatment/Interventions Gait training;Stair training;DME Instruction;ADLs/Self Care Home Management;Vasopneumatic Device;Manual techniques;Functional mobility training;Cryotherapy;Therapeutic activities;Therapeutic exercise;Electrical Stimulation;Iontophoresis 24m/ml Dexamethasone;Balance training;Neuromuscular re-education;Moist Heat;Ultrasound;Parrafin;Patient/family education;Dry needling;Taping;Passive range of motion   PT Next Visit Plan progress strength in LE and core    PT Home Exercise Plan level 1 knee and trunk stretching plus gastroc stretching, gave post pelvic tilt and abdominals, clam   Consulted and Agree with Plan of Care Patient     Added sidelying hip abd   Problem List Patient Active Problem List   Diagnosis Date Noted  . Spondylolisthesis 07/19/2015  . Right medial knee pain 06/29/2015  . Overweight (BMI 25.0-29.9) 06/29/2015  . Chronic pain of left knee 04/21/2015  . Memory deficit 11/28/2014  . Coronary atherosclerosis of native coronary artery 06/24/2013  . History of TIAs 06/24/2013  . GERD (gastroesophageal reflux disease) 06/24/2013  . Depression 12/31/2012  . CHEST PAIN UNSPECIFIED 02/17/2009  . THYROID NODULE 02/14/2009  . Elevated lipids 02/14/2009  . ANEMIA, CHRONIC 02/14/2009  . Essential hypertension 02/14/2009  . PULMONARY NODULE 02/14/2009     Wendy Petty 09/01/2015, 11:17 AM  [facility] [address]Phone: [phone]  Fax:  [phone] Name: Wendy SchlotterbeckMRN: [id number] of Birth: 704/02/1941  JRaeford Razor PT 09/01/2015 11:17 AM Phone: [phone]: [phone]

## 2015-09-05 ENCOUNTER — Ambulatory Visit: Payer: No Typology Code available for payment source | Admitting: Physical Therapy

## 2015-09-05 DIAGNOSIS — M545 Low back pain, unspecified: Secondary | ICD-10-CM

## 2015-09-05 DIAGNOSIS — M25562 Pain in left knee: Principal | ICD-10-CM

## 2015-09-05 DIAGNOSIS — R262 Difficulty in walking, not elsewhere classified: Secondary | ICD-10-CM

## 2015-09-05 DIAGNOSIS — M25561 Pain in right knee: Secondary | ICD-10-CM

## 2015-09-05 NOTE — Therapy (Signed)
[place], [place], [address] Phone: [phone]   Fax:  [phone]  Physical Therapy Treatment  Patient Details  Name: Wendy Petty MRN: [id number] Date of Birth: 18-Jan-1941 Referring Provider: Adrian Blackwater  Encounter Date: 09/05/2015      PT End of Session - 09/05/15 0941    Visit Number 15   Number of Visits 21   Date for PT Re-Evaluation 09/29/15   PT Start Time 0932   PT Stop Time 1023   PT Time Calculation (min) 51 min   Activity Tolerance Patient tolerated treatment well   Behavior During Therapy [facility] for tasks assessed/performed      Past Medical History  Diagnosis Date  . Hypertension   . GERD (gastroesophageal reflux disease)   . HYPERLIPIDEMIA   . THYROID NODULE   . PULMONARY NODULE   . Edema   . CHEST PAIN UNSPECIFIED   . ANEMIA, CHRONIC   . Hx of cardiovascular stress test     Lexiscan Myoview (11/14):  No ischemia, EF 68% (normal study)    Past Surgical History  Procedure Laterality Date  . Vaginal hysterectomy  1989  . Tubal ligation    . Left heart catheterization with coronary angiogram N/A 09/16/2011    Procedure: LEFT HEART CATHETERIZATION WITH CORONARY ANGIOGRAM;  Surgeon: [name], MD;  Location: [facility] CATH LAB;  Service: Cardiovascular;  Laterality: N/A;    There were no vitals filed for this visit.  Visit Diagnosis:  Arthralgia of both knees  Bilateral low back pain without sciatica  Difficulty walking up stairs  Difficulty walking      Subjective Assessment - 09/05/15 0939    Subjective Pt was sick (respiratory) Sunday.  No major complaints, feels better today.  No interpreter today.    Currently in Pain? Yes   Pain Score 4    Pain Location Leg   Pain Orientation Right;Left;Posterior;Upper;Lateral   Pain Descriptors / Indicators Sore   Pain Type Chronic pain   Pain Onset More than a month ago   Pain Frequency Intermittent           [facility]  Adult PT Treatment/Exercise - 09/05/15 0947    Lumbar Exercises: Prone   Straight Leg Raise 10 reps;Other (comment)   Straight Leg Raises Limitations over pillows  2 sets each leg,knee flexed x 1 , knee ext x 1    Knee/Hip Exercises: Aerobic   Nustep 6 min NuStep Level 8   Knee/Hip Exercises: Standing   Heel Raises Both;1 set;20 reps   Hip Abduction Stengthening;Both;1 set;20 reps;Knee straight   Abduction Limitations off step    Forward Step Up Both;1 set;20 reps;Hand Hold: 1;Step Height: 6"   Moist Heat Therapy   Number Minutes Moist Heat 15 Minutes   Moist Heat Location Lumbar Spine;Knee  sidelying    Manual Therapy   Manual Therapy Soft tissue mobilization;Myofascial release   Soft tissue mobilization bilateral lumbar paraspinals and into Rt. glute med   Myofascial Release low back                 PT Education - 09/05/15 0941    Education provided No          PT Short Term Goals - 08/15/15 0955    PT SHORT TERM GOAL #1   Title Patient will be I with initial HEP    Status Achieved   PT SHORT TERM GOAL #2   Title Pt will be able to tolerate  10 min of standing for housework (light) with min/mod increase in pain (knee/back)   Status Achieved   PT SHORT TERM GOAL #3   Title Pt will be able to step up with Rt. LE onto step with improved confidence, less pain.    Status Achieved   PT SHORT TERM GOAL #4   Title Pt will be able to use 2-3 pain mgmt strategies for home use.    Status Achieved           PT Long Term Goals - 09/01/15 1001    PT LONG TERM GOAL #1   Title pt will be I with more advanced HEP as of last visit.    Status On-going   PT LONG TERM GOAL #2   Title Pt will return to doing ADLs and housework without limits of pain most days of the week.    Status On-going   PT LONG TERM GOAL #3   Title Pt will be able to negotiate stairs in community without increase in pain using rails or device.    Status Partially Met   PT LONG TERM GOAL #4   Title  Pt will be able to go without cane for communtiy gait (up to 30 min) and min increase in knee/back pain.    Baseline not painful in knees but low    Status Partially Met   PT LONG TERM GOAL #5   Title FOTO score will improve to no more than 45% limited    Baseline 08/15/15, 64%   Status On-going               Plan - 09/05/15 1007    Clinical Impression Statement No interpreter today, able to do basic strengthening exercises and relieve some pain in low back with manual.  Checked o2 sats today due to dyspnea, 99%, recovering from cold this weekend.     PT Next Visit Plan progress strength in LE and core    PT Home Exercise Plan level 1 knee and trunk stretching plus gastroc stretching, gave post pelvic tilt and abdominals, clam   Consulted and Agree with Plan of Care Patient        Problem List Patient Active Problem List   Diagnosis Date Noted  . Spondylolisthesis 07/19/2015  . Right medial knee pain 06/29/2015  . Overweight (BMI 25.0-29.9) 06/29/2015  . Chronic pain of left knee 04/21/2015  . Memory deficit 11/28/2014  . Coronary atherosclerosis of native coronary artery 06/24/2013  . History of TIAs 06/24/2013  . GERD (gastroesophageal reflux disease) 06/24/2013  . Depression 12/31/2012  . CHEST PAIN UNSPECIFIED 02/17/2009  . THYROID NODULE 02/14/2009  . Elevated lipids 02/14/2009  . ANEMIA, CHRONIC 02/14/2009  . Essential hypertension 02/14/2009  . PULMONARY NODULE 02/14/2009    PAA,JENNIFER 09/05/2015, 10:10 AM  [facility] [address] [place], [place], [address] Phone: [phone]   Fax:  [phone]  Name: Braylee Lal MRN: [id number] Date of Birth: [birth year]    Raeford Razor, PT 09/05/2015 10:10 AM Phone: [phone] Fax: [phone]

## 2015-09-13 ENCOUNTER — Ambulatory Visit: Payer: No Typology Code available for payment source | Attending: Family Medicine | Admitting: Physical Therapy

## 2015-09-13 DIAGNOSIS — M79662 Pain in left lower leg: Secondary | ICD-10-CM | POA: Insufficient documentation

## 2015-09-13 DIAGNOSIS — R262 Difficulty in walking, not elsewhere classified: Secondary | ICD-10-CM | POA: Insufficient documentation

## 2015-09-13 DIAGNOSIS — M545 Low back pain, unspecified: Secondary | ICD-10-CM

## 2015-09-13 DIAGNOSIS — M25561 Pain in right knee: Secondary | ICD-10-CM | POA: Insufficient documentation

## 2015-09-13 DIAGNOSIS — M25562 Pain in left knee: Secondary | ICD-10-CM | POA: Insufficient documentation

## 2015-09-13 NOTE — Therapy (Signed)
[place], [place], [address] Phone: [phone]   Fax:  [phone]  Physical Therapy Treatment  Patient Details  Name: Wendy Petty MRN: [id number] Date of Birth: 1941-01-10 Referring Provider: Adrian Blackwater  Encounter Date: 09/13/2015      PT End of Session - 09/13/15 1009    Visit Number 16   Number of Visits 21   Date for PT Re-Evaluation 09/29/15   PT Start Time 2025  late for 930 appt    PT Stop Time 1030   PT Time Calculation (min) 43 min   Activity Tolerance Patient tolerated treatment well   Behavior During Therapy [facility] for tasks assessed/performed      Past Medical History  Diagnosis Date  . Hypertension   . GERD (gastroesophageal reflux disease)   . HYPERLIPIDEMIA   . THYROID NODULE   . PULMONARY NODULE   . Edema   . CHEST PAIN UNSPECIFIED   . ANEMIA, CHRONIC   . Hx of cardiovascular stress test     Lexiscan Myoview (11/14):  No ischemia, EF 68% (normal study)    Past Surgical History  Procedure Laterality Date  . Vaginal hysterectomy  1989  . Tubal ligation    . Left heart catheterization with coronary angiogram N/A 09/16/2011    Procedure: LEFT HEART CATHETERIZATION WITH CORONARY ANGIOGRAM;  Surgeon: [name], MD;  Location: [facility] CATH LAB;  Service: Cardiovascular;  Laterality: N/A;    There were no vitals filed for this visit.  Visit Diagnosis:  Arthralgia of both knees  Bilateral low back pain without sciatica  Difficulty walking up stairs  Difficulty walking  Calf pain, left      Subjective Assessment - 09/13/15 0948    Subjective Much better, i have a tightness in my knees when i don;t exercise    Currently in Pain? No/denies  very little                          [facility] Adult PT Treatment/Exercise - 09/13/15 0954    Lumbar Exercises: Supine   Heel Slides 10 reps   Heel Slides Limitations with ball    Bridge 10 reps   Bridge  Limitations with ball   Knee/Hip Exercises: Stretches   Press photographer Both;2 reps;20 seconds   Soleus Stretch Both;2 reps;20 seconds   Knee/Hip Exercises: Standing   Heel Raises Both;1 set;10 reps   Heel Raises Limitations 3 lbs no UE support    Hip Flexion Stengthening   Hip Flexion Limitations marching 3 lbs    Hip Abduction Stengthening;Both;1 set;20 reps;Knee straight   Abduction Limitations off step    Forward Step Up Both;1 set;20 reps;Hand Hold: 1;Step Height: 6"   Forward Step Up Limitations 3 lbs, 6 inch step    Knee/Hip Exercises: Seated   Long Arc Quad Strengthening;Both;1 set;10 reps;Weights   Long Arc Quad Weight 5 lbs.   Knee/Hip Flexion x 20 with 3 lb weights    Sit to Sand 1 set;10 reps;without UE support     MHP to Lumbar spine and bilateral knees 15 min in supine              PT Short Term Goals - 08/15/15 0955    PT SHORT TERM GOAL #1   Title Patient will be I with initial HEP    Status Achieved   PT SHORT TERM GOAL #2   Title Pt will be able to tolerate  10 min of standing for housework (light) with min/mod increase in pain (knee/back)   Status Achieved   PT SHORT TERM GOAL #3   Title Pt will be able to step up with Rt. LE onto step with improved confidence, less pain.    Status Achieved   PT SHORT TERM GOAL #4   Title Pt will be able to use 2-3 pain mgmt strategies for home use.    Status Achieved           PT Long Term Goals - 09/01/15 1001    PT LONG TERM GOAL #1   Title pt will be I with more advanced HEP as of last visit.    Status On-going   PT LONG TERM GOAL #2   Title Pt will return to doing ADLs and housework without limits of pain most days of the week.    Status On-going   PT LONG TERM GOAL #3   Title Pt will be able to negotiate stairs in community without increase in pain using rails or device.    Status Partially Met   PT LONG TERM GOAL #4   Title Pt will be able to go without cane for communtiy gait (up to 30 min) and  min increase in knee/back pain.    Baseline not painful in knees but low    Status Partially Met   PT LONG TERM GOAL #5   Title FOTO score will improve to no more than 45% limited    Baseline 08/15/15, 64%   Status On-going               Plan - 09/13/15 1234    Clinical Impression Statement Pt late today, relies on others for transportation.  Pt able to tolerate about 30 min of continuous exercise for LE and core strengthening.  She likes to exercise and does daily.  Cont with POC.    PT Next Visit Plan progress strength in LE and core    PT Home Exercise Plan level 1 knee and trunk stretching plus gastroc stretching, gave post pelvic tilt and abdominals, clam   Consulted and Agree with Plan of Care Patient        Problem List Patient Active Problem List   Diagnosis Date Noted  . Spondylolisthesis 07/19/2015  . Right medial knee pain 06/29/2015  . Overweight (BMI 25.0-29.9) 06/29/2015  . Chronic pain of left knee 04/21/2015  . Memory deficit 11/28/2014  . Coronary atherosclerosis of native coronary artery 06/24/2013  . History of TIAs 06/24/2013  . GERD (gastroesophageal reflux disease) 06/24/2013  . Depression 12/31/2012  . CHEST PAIN UNSPECIFIED 02/17/2009  . THYROID NODULE 02/14/2009  . Elevated lipids 02/14/2009  . ANEMIA, CHRONIC 02/14/2009  . Essential hypertension 02/14/2009  . PULMONARY NODULE 02/14/2009    Wendy Petty 09/13/2015, 12:38 PM  [facility] [address] [place], [place], [address] Phone: [phone]   Fax:  [phone]  Name: Wendy Petty MRN: [id number] Date of Birth: 10/24/1940  Raeford Razor, PT 09/13/2015 12:39 PM Phone: [phone] Fax: [phone]

## 2015-09-15 ENCOUNTER — Ambulatory Visit: Payer: No Typology Code available for payment source | Admitting: Physical Therapy

## 2015-09-15 DIAGNOSIS — M545 Low back pain, unspecified: Secondary | ICD-10-CM

## 2015-09-15 DIAGNOSIS — R262 Difficulty in walking, not elsewhere classified: Secondary | ICD-10-CM

## 2015-09-15 DIAGNOSIS — M25562 Pain in left knee: Principal | ICD-10-CM

## 2015-09-15 DIAGNOSIS — M25561 Pain in right knee: Secondary | ICD-10-CM

## 2015-09-15 NOTE — Therapy (Signed)
[place], [place], [address] Phone: [phone]   Fax:  [phone]  Physical Therapy Treatment  Patient Details  Name: Wendy Petty MRN: [id number] Date of Birth: October 26, 1940 Referring Provider: Adrian Blackwater  Encounter Date: 09/15/2015      PT End of Session - 09/15/15 1326    Visit Number 17   Number of Visits 21   Date for PT Re-Evaluation 09/29/15   Authorization Type [employer] discount 100%   PT Start Time 0934   PT Stop Time 1017   PT Time Calculation (min) 43 min   Activity Tolerance Patient tolerated treatment well;No increased pain   Behavior During Therapy [facility] for tasks assessed/performed      Past Medical History  Diagnosis Date  . Hypertension   . GERD (gastroesophageal reflux disease)   . HYPERLIPIDEMIA   . THYROID NODULE   . PULMONARY NODULE   . Edema   . CHEST PAIN UNSPECIFIED   . ANEMIA, CHRONIC   . Hx of cardiovascular stress test     Lexiscan Myoview (11/14):  No ischemia, EF 68% (normal study)    Past Surgical History  Procedure Laterality Date  . Vaginal hysterectomy  1989  . Tubal ligation    . Left heart catheterization with coronary angiogram N/A 09/16/2011    Procedure: LEFT HEART CATHETERIZATION WITH CORONARY ANGIOGRAM;  Surgeon: [name], MD;  Location: [facility] CATH LAB;  Service: Cardiovascular;  Laterality: N/A;    There were no vitals filed for this visit.  Visit Diagnosis:  Arthralgia of both knees  Bilateral low back pain without sciatica  Difficulty walking      Subjective Assessment - 09/15/15 0936    Subjective Doing much better. Feeling some burning in her low back but this is typical.    Currently in Pain? Yes   Pain Score 3    Pain Location Knee   Pain Orientation Right;Left   Pain Descriptors / Indicators Burning   Pain Type Chronic pain   Aggravating Factors  not moving, staying still too long.    Pain Relieving Factors exercises                          [facility] Adult PT Treatment/Exercise - 09/15/15 0001    Lumbar Exercises: Stretches   Lower Trunk Rotation 10 seconds   Lower Trunk Rotation Limitations x 10    Lumbar Exercises: Supine   Ab Set 10 reps   AB Set Limitations verbal and tactile cues   Heel Slides 10 reps   Heel Slides Limitations with ball    Knee/Hip Exercises: Stretches   Gastroc Stretch Both;3 reps;30 seconds   Knee/Hip Exercises: Standing   Heel Raises Both;1 set;10 reps;Other (comment)  no UE support, SBA for safety   Hip Abduction Both;1 set;10 reps;Limitations   Abduction Limitations cues for abdominal activation    Forward Step Up Both;1 set;20 reps;Hand Hold: 1;Step Height: 6"   Forward Step Up Limitations 3 lbs, 6 inch step    Knee/Hip Exercises: Seated   Long Arc Quad Strengthening;Both;10 reps;Weights;2 sets   [employer] Weight 5 lbs.   Moist Heat Therapy   Number Minutes Moist Heat 15 Minutes   Moist Heat Location Lumbar Spine;Knee                PT Education - 09/15/15 1325    Education provided Yes   Education Details review of abdominal activation with  ADLs and HEP   Person(s) Educated Patient   Methods Explanation   Comprehension Verbalized understanding;Need further instruction          PT Short Term Goals - 08/15/15 0955    PT SHORT TERM GOAL #1   Title Patient will be I with initial HEP    Status Achieved   PT SHORT TERM GOAL #2   Title Pt will be able to tolerate 10 min of standing for housework (light) with min/mod increase in pain (knee/back)   Status Achieved   PT SHORT TERM GOAL #3   Title Pt will be able to step up with Rt. LE onto step with improved confidence, less pain.    Status Achieved   PT SHORT TERM GOAL #4   Title Pt will be able to use 2-3 pain mgmt strategies for home use.    Status Achieved           PT Long Term Goals - 09/01/15 1001    PT LONG TERM GOAL #1   Title pt will be I with more advanced HEP as of  last visit.    Status On-going   PT LONG TERM GOAL #2   Title Pt will return to doing ADLs and housework without limits of pain most days of the week.    Status On-going   PT LONG TERM GOAL #3   Title Pt will be able to negotiate stairs in community without increase in pain using rails or device.    Status Partially Met   PT LONG TERM GOAL #4   Title Pt will be able to go without cane for communtiy gait (up to 30 min) and min increase in knee/back pain.    Baseline not painful in knees but low    Status Partially Met   PT LONG TERM GOAL #5   Title FOTO score will improve to no more than 45% limited    Baseline 08/15/15, 64%   Status On-going               Plan - 09/15/15 1326    Clinical Impression Statement Patient reports that she feel like she is doing much better. No complaints after session, even reporting that she feels improvement after exercises. Will continue with skilled PT at this time with progressions as tolerated.    PT Next Visit Plan progress strength in LE and core    PT Home Exercise Plan level 1 knee and trunk stretching plus gastroc stretching, gave post pelvic tilt and abdominals, clam        Problem List Patient Active Problem List   Diagnosis Date Noted  . Spondylolisthesis 07/19/2015  . Right medial knee pain 06/29/2015  . Overweight (BMI 25.0-29.9) 06/29/2015  . Chronic pain of left knee 04/21/2015  . Memory deficit 11/28/2014  . Coronary atherosclerosis of native coronary artery 06/24/2013  . History of TIAs 06/24/2013  . GERD (gastroesophageal reflux disease) 06/24/2013  . Depression 12/31/2012  . CHEST PAIN UNSPECIFIED 02/17/2009  . THYROID NODULE 02/14/2009  . Elevated lipids 02/14/2009  . ANEMIA, CHRONIC 02/14/2009  . Essential hypertension 02/14/2009  . PULMONARY NODULE 02/14/2009    Linard Millers, PT 09/15/2015, 1:29 PM  [facility] [address] [place], [place],  [address] Phone: [phone]   Fax:  [phone]  Name: Wendy Petty MRN: [id number] Date of Birth: Nov 07, 1940

## 2015-09-19 ENCOUNTER — Ambulatory Visit: Payer: No Typology Code available for payment source | Admitting: Physical Therapy

## 2015-09-19 DIAGNOSIS — M25562 Pain in left knee: Principal | ICD-10-CM

## 2015-09-19 DIAGNOSIS — M25561 Pain in right knee: Secondary | ICD-10-CM

## 2015-09-19 DIAGNOSIS — M545 Low back pain, unspecified: Secondary | ICD-10-CM

## 2015-09-19 DIAGNOSIS — R262 Difficulty in walking, not elsewhere classified: Secondary | ICD-10-CM

## 2015-09-19 NOTE — Therapy (Signed)
[place], [place], [address] Phone: [phone]   Fax:  [phone]  Physical Therapy Treatment  Patient Details  Name: Wendy Petty MRN: [id number] Date of Birth: 08-20-1941 Referring Provider: Adrian Blackwater  Encounter Date: 09/19/2015      PT End of Session - 09/19/15 0943    Visit Number 18   Number of Visits 21   Date for PT Re-Evaluation 09/29/15   PT Start Time 0932   PT Stop Time 1030   PT Time Calculation (min) 58 min   Activity Tolerance Patient tolerated treatment well   Behavior During Therapy [facility] for tasks assessed/performed      Past Medical History  Diagnosis Date  . Hypertension   . GERD (gastroesophageal reflux disease)   . HYPERLIPIDEMIA   . THYROID NODULE   . PULMONARY NODULE   . Edema   . CHEST PAIN UNSPECIFIED   . ANEMIA, CHRONIC   . Hx of cardiovascular stress test     Lexiscan Myoview (11/14):  No ischemia, EF 68% (normal study)    Past Surgical History  Procedure Laterality Date  . Vaginal hysterectomy  1989  . Tubal ligation    . Left heart catheterization with coronary angiogram N/A 09/16/2011    Procedure: LEFT HEART CATHETERIZATION WITH CORONARY ANGIOGRAM;  Surgeon: [name], MD;  Location: [facility] CATH LAB;  Service: Cardiovascular;  Laterality: N/A;    There were no vitals filed for this visit.  Visit Diagnosis:  Arthralgia of both knees  Bilateral low back pain without sciatica  Difficulty walking  Difficulty walking up stairs      Subjective Assessment - 09/19/15 0941    Subjective Pt had a lot of pain this weekend, was in [place].  Popping in knee very painful, but then goes away.  Pt with multiple questions regarding options for treating knee arthritis, injections, etc.    Currently in Pain? Yes   Pain Score 3    Pain Location Knee   Pain Orientation Left   Pain Descriptors / Indicators Stabbing;Aching  at times, then resolves    Pain  Type Chronic pain   Pain Onset More than a month ago   Pain Frequency Intermittent   Pain Relieving Factors exercises, heat   Pain Score --  No back pain today            [facility] Adult PT Treatment/Exercise - 09/19/15 0946    Knee/Hip Exercises: Aerobic   Nustep 8 min L4 UE and LE    Knee/Hip Exercises: Machines for Strengthening   Cybex Knee Extension 1 plate x 15 slow    Cybex Knee Flexion 2 plates x 10 slow   Knee/Hip Exercises: Standing   Side Lunges Both;1 set;5 reps   Side Lunges Limitations red band    Hip Abduction Stengthening;Both;1 set;15 reps;Knee straight;Other (comment)   Abduction Limitations red band    Hip Extension Stengthening;Both;1 set;10 reps;Knee straight   Extension Limitations red band    Forward Step Up Both;1 set;20 reps;Hand Hold: 1   Functional Squat 1 set;10 reps   Functional Squat Limitations red band      LAQ x 20 seated        PT Education - 09/19/15 1228    Education provided Yes   Education Details options for knee treatment   Person(s) Educated Patient   Methods Explanation   Comprehension Verbalized understanding;Returned demonstration          PT Short Term Goals -  08/15/15 0955    PT SHORT TERM GOAL #1   Title Patient will be I with initial HEP    Status Achieved   PT SHORT TERM GOAL #2   Title Pt will be able to tolerate 10 min of standing for housework (light) with min/mod increase in pain (knee/back)   Status Achieved   PT SHORT TERM GOAL #3   Title Pt will be able to step up with Rt. LE onto step with improved confidence, less pain.    Status Achieved   PT SHORT TERM GOAL #4   Title Pt will be able to use 2-3 pain mgmt strategies for home use.    Status Achieved           PT Long Term Goals - 09/01/15 1001    PT LONG TERM GOAL #1   Title pt will be I with more advanced HEP as of last visit.    Status On-going   PT LONG TERM GOAL #2   Title Pt will return to doing ADLs and housework without limits of pain  most days of the week.    Status On-going   PT LONG TERM GOAL #3   Title Pt will be able to negotiate stairs in community without increase in pain using rails or device.    Status Partially Met   PT LONG TERM GOAL #4   Title Pt will be able to go without cane for communtiy gait (up to 30 min) and min increase in knee/back pain.    Baseline not painful in knees but low    Status Partially Met   PT LONG TERM GOAL #5   Title FOTO score will improve to no more than 45% limited    Baseline 08/15/15, 64%   Status On-going               Plan - 09/19/15 1229    Clinical Impression Statement Patient doing well, able to do closed chain ex without increased pain. Has 2 more appts to finalize HEP and discuss options for continuing with progress.    PT Next Visit Plan progress strength in LE and core    PT Home Exercise Plan level 1 knee and trunk stretching plus gastroc stretching, gave post pelvic tilt and abdominals, clam   Consulted and Agree with Plan of Care Patient        Problem List Patient Active Problem List   Diagnosis Date Noted  . Spondylolisthesis 07/19/2015  . Right medial knee pain 06/29/2015  . Overweight (BMI 25.0-29.9) 06/29/2015  . Chronic pain of left knee 04/21/2015  . Memory deficit 11/28/2014  . Coronary atherosclerosis of native coronary artery 06/24/2013  . History of TIAs 06/24/2013  . GERD (gastroesophageal reflux disease) 06/24/2013  . Depression 12/31/2012  . CHEST PAIN UNSPECIFIED 02/17/2009  . THYROID NODULE 02/14/2009  . Elevated lipids 02/14/2009  . ANEMIA, CHRONIC 02/14/2009  . Essential hypertension 02/14/2009  . PULMONARY NODULE 02/14/2009    Julene Rahn 09/19/2015, 12:32 PM  [facility] [address] [place], [place], [address] Phone: [phone]   Fax:  [phone]  Name: Wendy Petty MRN: [id number] Date of Birth: [birth year]  Raeford Razor, PT 09/19/2015 12:35  PM Phone: [phone] Fax: [phone]

## 2015-09-22 ENCOUNTER — Ambulatory Visit: Payer: No Typology Code available for payment source | Admitting: Physical Therapy

## 2015-09-22 ENCOUNTER — Encounter: Payer: No Typology Code available for payment source | Admitting: Physical Therapy

## 2015-09-22 DIAGNOSIS — M79662 Pain in left lower leg: Secondary | ICD-10-CM

## 2015-09-22 DIAGNOSIS — R262 Difficulty in walking, not elsewhere classified: Secondary | ICD-10-CM

## 2015-09-22 DIAGNOSIS — M545 Low back pain, unspecified: Secondary | ICD-10-CM

## 2015-09-22 DIAGNOSIS — M25561 Pain in right knee: Secondary | ICD-10-CM

## 2015-09-22 DIAGNOSIS — M25562 Pain in left knee: Principal | ICD-10-CM

## 2015-09-22 NOTE — Therapy (Signed)
[place], [place], [address] Phone: [phone]   Fax:  [phone]  Physical Therapy Treatment  Patient Details  Name: Wendy Petty MRN: [id number] Date of Birth: 1941-06-09 Referring Provider: Adrian Blackwater  Encounter Date: 09/22/2015      PT End of Session - 09/22/15 1027    Visit Number 19   Number of Visits 21   Date for PT Re-Evaluation 09/29/15   PT Start Time 0932   PT Stop Time 1030   PT Time Calculation (min) 58 min   Activity Tolerance Patient tolerated treatment well   Behavior During Therapy [facility] for tasks assessed/performed      Past Medical History  Diagnosis Date  . Hypertension   . GERD (gastroesophageal reflux disease)   . HYPERLIPIDEMIA   . THYROID NODULE   . PULMONARY NODULE   . Edema   . CHEST PAIN UNSPECIFIED   . ANEMIA, CHRONIC   . Hx of cardiovascular stress test     Lexiscan Myoview (11/14):  No ischemia, EF 68% (normal study)    Past Surgical History  Procedure Laterality Date  . Vaginal hysterectomy  1989  . Tubal ligation    . Left heart catheterization with coronary angiogram N/A 09/16/2011    Procedure: LEFT HEART CATHETERIZATION WITH CORONARY ANGIOGRAM;  Surgeon: [name], MD;  Location: [facility] CATH LAB;  Service: Cardiovascular;  Laterality: N/A;    There were no vitals filed for this visit.  Visit Diagnosis:  Arthralgia of both knees  Bilateral low back pain without sciatica  Difficulty walking  Difficulty walking up stairs  Calf pain, left      Subjective Assessment - 09/22/15 0943    Subjective Has a little pain today in back, thighs and knees.  (MIn) Reports heaviness in legs when I walk too much (>20-30 min )   Currently in Pain? Yes   Pain Score 3    Pain Location Knee   Pain Orientation Right;Left   Pain Type Chronic pain   Pain Onset More than a month ago   Pain Frequency Intermittent   Pain Score 3   Pain Location Leg   Pain  Orientation Lower   Pain Descriptors / Indicators Heaviness   Pain Type Chronic pain   Pain Onset More than a month ago                         [facility] Adult PT Treatment/Exercise - 09/22/15 0953    Lumbar Exercises: Stretches   Active Hamstring Stretch 3 reps;30 seconds   Active Hamstring Stretch Limitations seated   Single Knee to Chest Stretch 3 reps   Lower Trunk Rotation 5 reps   Lumbar Exercises: Supine   Ab Set 10 reps   AB Set Limitations verbal and tactile cues   Knee/Hip Exercises: Aerobic   Nustep 6 min L6 UE and LE    Knee/Hip Exercises: Seated   Long Arc Quad Strengthening;Both;1 set;10 reps   Long Arc Quad Weight --  green   Knee/Hip Flexion 20   Hamstring Curl Strengthening;Both;1 set;10 reps   Knee/Hip Exercises: Sidelying   Hip ABduction Strengthening;Both;1 set;10 reps   Clams x 20    Moist Heat Therapy   Number Minutes Moist Heat 15 Minutes   Moist Heat Location Lumbar Spine                PT Education - 09/22/15 1026    Education provided  Yes   Education Details HEP review and alternatives with green band seated   Person(s) Educated Patient   Methods Explanation;Demonstration   Comprehension Verbalized understanding;Returned demonstration          PT Short Term Goals - 08/15/15 0955    PT SHORT TERM GOAL #1   Title Patient will be I with initial HEP    Status Achieved   PT SHORT TERM GOAL #2   Title Pt will be able to tolerate 10 min of standing for housework (light) with min/mod increase in pain (knee/back)   Status Achieved   PT SHORT TERM GOAL #3   Title Pt will be able to step up with Rt. LE onto step with improved confidence, less pain.    Status Achieved   PT SHORT TERM GOAL #4   Title Pt will be able to use 2-3 pain mgmt strategies for home use.    Status Achieved           PT Long Term Goals - 09/22/15 1040    PT LONG TERM GOAL #1   Title pt will be I with more advanced HEP as of last visit.    Status  On-going   PT LONG TERM GOAL #2   Title Pt will return to doing ADLs and housework without limits of pain most days of the week.    Status Achieved   PT LONG TERM GOAL #3   Title Pt will be able to negotiate stairs in community without increase in pain using rails or device.    Status Partially Met   PT LONG TERM GOAL #4   Title Pt will be able to go without cane for communtiy gait (up to 30 min) and min increase in knee/back pain.    Status Achieved   PT LONG TERM GOAL #5   Title FOTO score will improve to no more than 45% limited    Status Unable to assess               Plan - 09/22/15 1028    Clinical Impression Statement Pt needs min cues for HEP. Has met goals for standing and housework, stairs.  1 more visit   PT Next Visit Plan progress strength, answer questions about HEP   PT Home Exercise Plan level 1 knee and trunk stretching plus gastroc stretching, gave post pelvic tilt and abdominals, clam   Consulted and Agree with Plan of Care Patient     FOTO NEXT VISIT    Problem List Patient Active Problem List   Diagnosis Date Noted  . Spondylolisthesis 07/19/2015  . Right medial knee pain 06/29/2015  . Overweight (BMI 25.0-29.9) 06/29/2015  . Chronic pain of left knee 04/21/2015  . Memory deficit 11/28/2014  . Coronary atherosclerosis of native coronary artery 06/24/2013  . History of TIAs 06/24/2013  . GERD (gastroesophageal reflux disease) 06/24/2013  . Depression 12/31/2012  . CHEST PAIN UNSPECIFIED 02/17/2009  . THYROID NODULE 02/14/2009  . Elevated lipids 02/14/2009  . ANEMIA, CHRONIC 02/14/2009  . Essential hypertension 02/14/2009  . PULMONARY NODULE 02/14/2009    Arless Vineyard 09/22/2015, 11:54 AM  [facility] [address] [place], [place], [address] Phone: [phone]   Fax:  [phone]  Name: Wendy Petty MRN: [id number] Date of Birth: [birth year]  Raeford Razor, PT 09/22/2015 11:55  AM Phone: [phone] Fax: [phone]

## 2015-09-26 ENCOUNTER — Ambulatory Visit: Payer: No Typology Code available for payment source | Admitting: Physical Therapy

## 2015-09-26 ENCOUNTER — Ambulatory Visit: Payer: No Typology Code available for payment source | Attending: Family Medicine | Admitting: Physician Assistant

## 2015-09-26 VITALS — BP 133/79 | HR 80 | Temp 98.3°F | Resp 16 | Ht 59.5 in | Wt 159.0 lb

## 2015-09-26 DIAGNOSIS — M25561 Pain in right knee: Secondary | ICD-10-CM

## 2015-09-26 DIAGNOSIS — R262 Difficulty in walking, not elsewhere classified: Secondary | ICD-10-CM

## 2015-09-26 DIAGNOSIS — M545 Low back pain, unspecified: Secondary | ICD-10-CM

## 2015-09-26 DIAGNOSIS — G8929 Other chronic pain: Secondary | ICD-10-CM

## 2015-09-26 DIAGNOSIS — M25562 Pain in left knee: Secondary | ICD-10-CM

## 2015-09-26 DIAGNOSIS — R1012 Left upper quadrant pain: Secondary | ICD-10-CM

## 2015-09-26 DIAGNOSIS — M79662 Pain in left lower leg: Secondary | ICD-10-CM

## 2015-09-26 DIAGNOSIS — E038 Other specified hypothyroidism: Secondary | ICD-10-CM

## 2015-09-26 DIAGNOSIS — K219 Gastro-esophageal reflux disease without esophagitis: Secondary | ICD-10-CM

## 2015-09-26 DIAGNOSIS — I10 Essential (primary) hypertension: Secondary | ICD-10-CM

## 2015-09-26 MED ORDER — LORATADINE 10 MG PO TABS: 10.0000 mg | ORAL_TABLET | Freq: Every day | ORAL | Status: DC

## 2015-09-26 MED ORDER — FLUTICASONE PROPIONATE 50 MCG/ACT NA SUSP: 2.0000 | Freq: Every day | NASAL | Status: DC

## 2015-09-26 MED ORDER — ASPIRIN 81 MG PO TABS: 81.0000 mg | ORAL_TABLET | Freq: Every day | ORAL | Status: DC

## 2015-09-26 MED ORDER — GABAPENTIN 300 MG PO CAPS: 300.0000 mg | ORAL_CAPSULE | Freq: Every day | ORAL | Status: DC

## 2015-09-26 MED ORDER — OMEPRAZOLE 20 MG PO CPDR: 20.0000 mg | DELAYED_RELEASE_CAPSULE | Freq: Every day | ORAL | Status: DC

## 2015-09-26 MED ORDER — HYDROCODONE-ACETAMINOPHEN 5-325 MG PO TABS: 1.0000 | ORAL_TABLET | ORAL | Status: DC | PRN

## 2015-09-26 MED ORDER — AMLODIPINE BESYLATE 10 MG PO TABS: 10.0000 mg | ORAL_TABLET | Freq: Every day | ORAL | Status: DC

## 2015-09-26 MED ORDER — CLOPIDOGREL BISULFATE 75 MG PO TABS: 75.0000 mg | ORAL_TABLET | Freq: Every day | ORAL | Status: DC

## 2015-09-26 MED ORDER — LEVOTHYROXINE SODIUM 25 MCG PO TABS: ORAL_TABLET | ORAL | Status: DC

## 2015-09-26 MED ORDER — MEMANTINE HCL 10 MG PO TABS: 10.0000 mg | ORAL_TABLET | Freq: Every day | ORAL | Status: DC

## 2015-09-26 NOTE — Progress Notes (Signed)
Patient c/o abdominal pain on her left side. She states she's having liquid coming from navel with a foul odor. Described as aching and discomfort, constant.  Patient c/o leg pain in both legs described as aching rated at 1-2.  Patient requesting a refill on meds.

## 2015-09-26 NOTE — Patient Instructions (Signed)
Keep appt with Dr. Adrian Blackwater on Friday

## 2015-09-26 NOTE — Progress Notes (Signed)
Chief Complaint: Abdominal pain, right hip pain, allergies  Subjective: This is a pleasant 75 year old Hispanic female with multiple complaints. It actually was pretty hard to direct her to discuss her issues that were specific to the walk-in clinic for today. Her past medical history, allergies, and medications have been reviewed.  She is primarily complaining of abdominal pain. She has 2 different types. There is one sensation in the left upper quadrant just by her left rib cage that occasionally hurts when she eats. It does not hurt with taking a deep breath. Is non-positional. She has had bowel movements daily. She is not constipated. She takes a stool softener regularly. She feels that she has a great deal of reflux. The other discomfort is around her umbilical area. She states that occasionally she has some drainage that is foul in odor from her navel. No nausea or vomiting. No change in weight. No loss of appetite. No blood in her stools.  Secondly, she has some chronic issues with her back and right leg and hip. She seen a sports medicine therapist. She states that all in all her symptoms have improved some. She takes mobile.  She also states that she's been having runny nose, itchy watery eyes and possibly fluid on her left ear. She would like a refill on her allergy medicines as well as the other medications. She  She has an appointment with her primary care provider on this Friday.    ROS:  GEN: denies fever or chills, denies change in weight HEENT: denies headache, +earache, no epistaxis, sore throat, or neck pain ABD: + abd pain, no N or V EXT: denies muscle spasms or swelling; +pain in right lower ext, no weakness NEURO: denies numbness or tingling, denies sz, stroke or TIA   Objective:  Filed Vitals:   09/26/15 1053  BP: 133/79  Pulse: 80  Temp: 98.3 F (36.8 C)  TempSrc: Oral  Resp: 16  Height: 4' 11.5" (1.511 m)  Weight: 159 lb (72.122 kg)  SpO2: 98%     Physical Exam:  General: in no acute distress. HEENT: no pallor, no icterus, moist oral mucosa, no JVD, no lymphadenopathy. Fluid behind left ear. Heart: Normal  s1 &s2  Regular rate and rhythm, without murmurs, rubs, gallops. Lungs: Clear to auscultation bilaterally. Abdomen: Soft, nontender, nondistended, positive bowel sounds.BENIGN abdominal exam. Extremities: No clubbing cyanosis or edema with positive pedal pulses. Neuro: Alert, awake, oriented x3, nonfocal.   Medications: Prior to Admission medications   Medication Sig Start Date End Date Taking? Authorizing Provider  amLODipine (NORVASC) 10 MG tablet Take 1 tablet (10 mg total) by mouth daily. 09/26/15  Yes Ulysse Siemen Daneil Dan, PA-C  aspirin 81 MG tablet Take 1 tablet (81 mg total) by mouth daily. 09/26/15  Yes Kristene Liberati Daneil Dan, PA-C  atorvastatin (LIPITOR) 40 MG tablet Take 1 tablet (40 mg total) by mouth daily. 04/21/15  Yes Josalyn Funches, MD  carvedilol (COREG) 12.5 MG tablet Take 1/2 tablet by mouth twice a day for 2 weeks then take 1 tablet by mouth twice a day 06/22/15  Yes Isaiah Serge, NP  clopidogrel (PLAVIX) 75 MG tablet Take 1 tablet (75 mg total) by mouth daily. 09/26/15  Yes Kalaya Infantino Daneil Dan, PA-C  docusate sodium (COLACE) 100 MG capsule Take 1 capsule (100 mg total) by mouth daily. To prevent constipation while taking narcotic pain reliever like Vicodin (hydrocodone). 06/24/15  Yes Charlesetta Shanks, MD  donepezil (ARICEPT) 5 MG tablet Take 2 tablets (10 mg total)  by mouth daily. 04/21/15  Yes Josalyn Funches, MD  gabapentin (NEURONTIN) 300 MG capsule Take 1 capsule (300 mg total) by mouth at bedtime. 09/26/15  Yes Valda Christenson Daneil Dan, PA-C  HYDROcodone-acetaminophen (NORCO/VICODIN) 5-325 MG tablet Take 1-2 tablets by mouth every 4 (four) hours as needed for moderate pain or severe pain. 09/26/15  Yes Wreatha Sturgeon Daneil Dan, PA-C  levothyroxine (SYNTHROID) 25 MCG tablet TAKE 1 TABLET BY MOUTH ONCE DAILY 09/26/15  Yes Elleigh Cassetta Daneil Dan, PA-C   loratadine (CLARITIN) 10 MG tablet Take 1 tablet (10 mg total) by mouth daily. 09/26/15  Yes Sarp Vernier Daneil Dan, PA-C  meclizine (ANTIVERT) 25 MG tablet Take 1 tablet (25 mg total) by mouth 3 (three) times daily as needed for dizziness or nausea. 04/21/15  Yes Josalyn Funches, MD  meloxicam (MOBIC) 15 MG tablet Take 1 tablet (15 mg total) by mouth daily. 08/02/15  Yes Bryan R Hess, DO  memantine (NAMENDA) 10 MG tablet Take 1 tablet (10 mg total) by mouth daily. Pt states she takes 1/4 of the tablet by mouth daily 09/26/15  Yes Kristeena Meineke Daneil Dan, PA-C  nitroGLYCERIN (NITROSTAT) 0.4 MG SL tablet Place 1 tablet (0.4 mg total) under the tongue every 5 (five) minutes as needed for chest pain. 07/08/13  Yes Thurnell Lose, MD  omeprazole (PRILOSEC) 20 MG capsule Take 1 capsule (20 mg total) by mouth daily. 09/26/15  Yes Benay Pomeroy Daneil Dan, PA-C  predniSONE (DELTASONE) 10 MG tablet Take as directed 07/19/15  Yes Bryan R Hess, DO  fluticasone (FLONASE) 50 MCG/ACT nasal spray Place 2 sprays into both nostrils daily. 09/26/15   Brayton Caves, PA-C    AssessmentPlan:  1. LUQ abdominal pain  -PPI refill  -if no improvement by Friday, may need bloodwork 2. Right hip/LBP  -cont conservative measures  -resume PT  -Cont Mobic 3. Allergies  -Claritin refill  -Add Flonase   Follow [id number] previously scheduled appt with Dr. Adrian Blackwater for Friday  The patient was given clear instructions to go to ER or return to medical center if symptoms don't improve, worsen or new problems develop. The patient verbalized understanding.  This note has been created with Surveyor, quantity. Any transcriptional errors are unintentional.   Zettie Pho, PA-C 09/26/2015, 12:01 PM

## 2015-09-26 NOTE — Therapy (Signed)
[place], [place], [address] Phone: [phone]   Fax:  [phone]  Physical Therapy Treatment  Patient Details  Name: Wendy Petty MRN: [id number] Date of Birth: 12-22-1940 Referring Provider: Adrian Blackwater  Encounter Date: 09/26/2015      PT End of Session - 09/26/15 1129    Visit Number 20   Number of Visits 21   Date for PT Re-Evaluation 09/29/15   PT Start Time 0930   PT Stop Time 1030   PT Time Calculation (min) 60 min   Activity Tolerance Patient tolerated treatment well   Behavior During Therapy [facility] for tasks assessed/performed      Past Medical History  Diagnosis Date  . Hypertension   . GERD (gastroesophageal reflux disease)   . HYPERLIPIDEMIA   . THYROID NODULE   . PULMONARY NODULE   . Edema   . CHEST PAIN UNSPECIFIED   . ANEMIA, CHRONIC   . Hx of cardiovascular stress test     Lexiscan Myoview (11/14):  No ischemia, EF 68% (normal study)    Past Surgical History  Procedure Laterality Date  . Vaginal hysterectomy  1989  . Tubal ligation    . Left heart catheterization with coronary angiogram N/A 09/16/2011    Procedure: LEFT HEART CATHETERIZATION WITH CORONARY ANGIOGRAM;  Surgeon: [name], MD;  Location: [facility] CATH LAB;  Service: Cardiovascular;  Laterality: N/A;    There were no vitals filed for this visit.  Visit Diagnosis:  Arthralgia of both knees  Bilateral low back pain without sciatica  Difficulty walking  Difficulty walking up stairs  Calf pain, left      Subjective Assessment - 09/26/15 0944    Subjective No pain today.  Only has pain when she goes up and down stairs, medial knee, today the Rt. one hurts when she does this.     Currently in Pain? No/denies                         [facility] Adult PT Treatment/Exercise - 09/26/15 0950    Ambulation/Gait   Ambulation/Gait Yes   Ambulation Distance (Feet) 300 Feet   Assistive device  None   Gait Pattern Step-to pattern;Decreased step length - right;Decreased step length - left   Ambulation Surface Level;Indoor   Stairs Yes   Stairs Assistance 5: Supervision   Stairs Assistance Details (indicate cue type and reason) verbal for sequence and safety   Stair Management Technique No rails  used for safety only    Number of Stairs 16   Height of Stairs --  6 inch, 4 inch   Self-Care   Other Self-Care Comments  stair negotiation cues for sequencing and using rails    Lumbar Exercises: Stretches   Lower Trunk Rotation 5 reps   Lumbar Exercises: Supine   Heel Slides 10 reps   Heel Slides Limitations with ball    Bridge 10 reps   Bridge Limitations with ball   Lumbar Exercises: Sidelying   Clam 20 reps   Clam Limitations blue band   Knee/Hip Exercises: Aerobic   Nustep 6 min level 2    Knee/Hip Exercises: Standing   Heel Raises Both;1 set;20 reps   Heel Raises Limitations 3 lbs   Wall Squat 15 reps   Other Standing Knee Exercises high march 3 lbs x 10 each   Knee/Hip Exercises: Supine   Short Arc Quad Sets Strengthening;Both;1 set;20 reps   Short Arc  Quad Sets Limitations 3 lbs   Straight Leg Raises Strengthening;Both;1 set;20 reps   Straight Leg Raises Limitations 3 lbs    Straight Leg Raise with External Rotation Strengthening;Both;1 set;10 reps   Straight Leg Raise with External Rotation Limitations 3 lbs                 PT Education - 09/26/15 1125    Education provided Yes   Education Details "why does my knee pop", arthritis, stair negotiation, safety   Person(s) Educated Patient   Methods Explanation;Verbal cues   Comprehension Verbalized understanding;Returned demonstration          PT Short Term Goals - 08/15/15 0955    PT SHORT TERM GOAL #1   Title Patient will be I with initial HEP    Status Achieved   PT SHORT TERM GOAL #2   Title Pt will be able to tolerate 10 min of standing for housework (light) with min/mod increase in pain  (knee/back)   Status Achieved   PT SHORT TERM GOAL #3   Title Pt will be able to step up with Rt. LE onto step with improved confidence, less pain.    Status Achieved   PT SHORT TERM GOAL #4   Title Pt will be able to use 2-3 pain mgmt strategies for home use.    Status Achieved           PT Long Term Goals - 09/26/15 1138    PT LONG TERM GOAL #1   Title pt will be I with more advanced HEP as of last visit.    Status On-going   PT LONG TERM GOAL #2   Title Pt will return to doing ADLs and housework without limits of pain most days of the week.    Status Achieved   PT LONG TERM GOAL #3   Title Pt will be able to negotiate stairs in community without increase in pain using rails or device.    Status Achieved   PT LONG TERM GOAL #4   Title Pt will be able to go without cane for communtiy gait (up to 30 min) and min increase in knee/back pain.    Status Achieved   PT LONG TERM GOAL #5   Title FOTO score will improve to no more than 45% limited    Status Unable to assess               Plan - 09/26/15 1130    Clinical Impression Statement Patient very appreciative of her progress, has been able to do a full session of PT without increase in pain. She complains of occ medial knee pain with stairs. She also reports during session feelings of cold and heat in knees at times, unable to give her a good reason for that today, but I did explain nerve signals and how temperatures can interpret different sensations.     PT Next Visit Plan progress strength, answer questions about HEP, FOTO and DC    PT Home Exercise Plan level 1 knee and trunk stretching plus gastroc stretching, gave post pelvic tilt and abdominals, clam   Consulted and Agree with Plan of Care Patient        Problem List Patient Active Problem List   Diagnosis Date Noted  . Spondylolisthesis 07/19/2015  . Right medial knee pain 06/29/2015  . Overweight (BMI 25.0-29.9) 06/29/2015  . Chronic pain of left knee  04/21/2015  . Memory deficit 11/28/2014  . Coronary atherosclerosis of native  coronary artery 06/24/2013  . History of TIAs 06/24/2013  . GERD (gastroesophageal reflux disease) 06/24/2013  . Depression 12/31/2012  . CHEST PAIN UNSPECIFIED 02/17/2009  . THYROID NODULE 02/14/2009  . Elevated lipids 02/14/2009  . ANEMIA, CHRONIC 02/14/2009  . Essential hypertension 02/14/2009  . PULMONARY NODULE 02/14/2009    Zakyia Gagan 09/26/2015, 11:44 AM  [facility] [address] [place], [place], [address] Phone: [phone]   Fax:  [phone]  Name: Isaly Delehanty MRN: [id number] Date of Birth: 10/07/1940    Raeford Razor, PT 09/26/2015 11:45 AM Phone: [phone] Fax: [phone]

## 2015-09-29 ENCOUNTER — Ambulatory Visit: Payer: No Typology Code available for payment source | Attending: Family Medicine | Admitting: Family Medicine

## 2015-09-29 ENCOUNTER — Ambulatory Visit: Payer: No Typology Code available for payment source | Admitting: Family Medicine

## 2015-09-29 ENCOUNTER — Encounter: Payer: Self-pay | Admitting: Family Medicine

## 2015-09-29 ENCOUNTER — Ambulatory Visit: Payer: No Typology Code available for payment source | Admitting: Physical Therapy

## 2015-09-29 VITALS — BP 129/81 | HR 78 | Temp 98.1°F | Resp 16 | Ht 59.5 in | Wt 157.0 lb

## 2015-09-29 DIAGNOSIS — M170 Bilateral primary osteoarthritis of knee: Secondary | ICD-10-CM | POA: Insufficient documentation

## 2015-09-29 DIAGNOSIS — K219 Gastro-esophageal reflux disease without esophagitis: Secondary | ICD-10-CM | POA: Insufficient documentation

## 2015-09-29 DIAGNOSIS — Z0000 Encounter for general adult medical examination without abnormal findings: Secondary | ICD-10-CM

## 2015-09-29 DIAGNOSIS — Z972 Presence of dental prosthetic device (complete) (partial): Secondary | ICD-10-CM

## 2015-09-29 DIAGNOSIS — D649 Anemia, unspecified: Secondary | ICD-10-CM | POA: Insufficient documentation

## 2015-09-29 DIAGNOSIS — M25561 Pain in right knee: Secondary | ICD-10-CM

## 2015-09-29 DIAGNOSIS — R262 Difficulty in walking, not elsewhere classified: Secondary | ICD-10-CM

## 2015-09-29 DIAGNOSIS — M25562 Pain in left knee: Principal | ICD-10-CM

## 2015-09-29 DIAGNOSIS — K0889 Other specified disorders of teeth and supporting structures: Secondary | ICD-10-CM | POA: Insufficient documentation

## 2015-09-29 DIAGNOSIS — E785 Hyperlipidemia, unspecified: Secondary | ICD-10-CM | POA: Insufficient documentation

## 2015-09-29 DIAGNOSIS — E1129 Type 2 diabetes mellitus with other diabetic kidney complication: Secondary | ICD-10-CM

## 2015-09-29 DIAGNOSIS — M545 Low back pain, unspecified: Secondary | ICD-10-CM

## 2015-09-29 DIAGNOSIS — R413 Other amnesia: Secondary | ICD-10-CM

## 2015-09-29 DIAGNOSIS — Z7982 Long term (current) use of aspirin: Secondary | ICD-10-CM | POA: Insufficient documentation

## 2015-09-29 DIAGNOSIS — Z23 Encounter for immunization: Secondary | ICD-10-CM | POA: Insufficient documentation

## 2015-09-29 DIAGNOSIS — Z79899 Other long term (current) drug therapy: Secondary | ICD-10-CM | POA: Insufficient documentation

## 2015-09-29 DIAGNOSIS — R809 Proteinuria, unspecified: Secondary | ICD-10-CM

## 2015-09-29 DIAGNOSIS — H9202 Otalgia, left ear: Secondary | ICD-10-CM | POA: Insufficient documentation

## 2015-09-29 DIAGNOSIS — I10 Essential (primary) hypertension: Secondary | ICD-10-CM | POA: Insufficient documentation

## 2015-09-29 DIAGNOSIS — E038 Other specified hypothyroidism: Secondary | ICD-10-CM

## 2015-09-29 DIAGNOSIS — E039 Hypothyroidism, unspecified: Secondary | ICD-10-CM | POA: Insufficient documentation

## 2015-09-29 DIAGNOSIS — M79662 Pain in left lower leg: Secondary | ICD-10-CM

## 2015-09-29 DIAGNOSIS — E119 Type 2 diabetes mellitus without complications: Secondary | ICD-10-CM | POA: Insufficient documentation

## 2015-09-29 DIAGNOSIS — I2510 Atherosclerotic heart disease of native coronary artery without angina pectoris: Secondary | ICD-10-CM

## 2015-09-29 DIAGNOSIS — L299 Pruritus, unspecified: Secondary | ICD-10-CM | POA: Insufficient documentation

## 2015-09-29 HISTORY — DX: Type 2 diabetes mellitus with other diabetic kidney complication: E11.29

## 2015-09-29 HISTORY — DX: Hypothyroidism, unspecified: E03.9

## 2015-09-29 HISTORY — DX: Proteinuria, unspecified: R80.9

## 2015-09-29 HISTORY — DX: Bilateral primary osteoarthritis of knee: M17.0

## 2015-09-29 LAB — POCT GLYCOSYLATED HEMOGLOBIN (HGB A1C): HEMOGLOBIN A1C: 7.2

## 2015-09-29 MED ORDER — HYDROCORTISONE 1 % EX CREA: 1.0000 "application " | TOPICAL_CREAM | Freq: Two times a day (BID) | CUTANEOUS | Status: DC

## 2015-09-29 MED ORDER — ACETAMINOPHEN ER 650 MG PO TBCR: 650.0000 mg | EXTENDED_RELEASE_TABLET | Freq: Three times a day (TID) | ORAL | Status: DC | PRN

## 2015-09-29 NOTE — Assessment & Plan Note (Signed)
Controlled diabetes Opthalmology referral Low carb and low sugar diet for now

## 2015-09-29 NOTE — Progress Notes (Signed)
F/U HTN TSH  C/C abdominal pain, discharge from navel  Pain scale #6 , pain on legs  No tobacco user  No suicidal thought in the past two weeks

## 2015-09-29 NOTE — Assessment & Plan Note (Signed)
A: Blood pressure at goal. Meds: compliant  P: I will continue the patient's current medication regimen since her blood pressure is at goal.   

## 2015-09-29 NOTE — Progress Notes (Signed)
Subjective:  Patient ID: Wendy Petty, female    DOB: 05-Dec-1940  Age: 75 y.o. MRN: [id number]  CC: Hypertension and Diabetes  Spanish interpreter used, tele   HPI Wendy Petty presents for    1. CHRONIC HYPERTENSION  Disease Monitoring  Blood pressure range: not checking   Chest pain: no   Dyspnea: no   Claudication: no   Medication compliance: yes  Medication Side Effects  Lightheadedness: no   Urinary frequency: no   Edema: yes, with prolonged sitting    Impotence:    2. CHRONIC DIABETES dx in 2013. A1c has been up and down in 6s mostly. She was told she did not have diabetes when she went to [place].   Disease Monitoring  Blood Sugar Ranges: not checking   Polyuria: no   Visual problems: no   Medication Compliance: none   Medication Side Effects  Hypoglycemia: no   Preventitive Health Care  Eye Exam: due   Foot Exam: done today    3. Osteoarthritis in knees: both knees painful. This is chronic. Waxes and wanes. Sometimes has swelling in knees. No redness.    4. Itching: at belly button x one month. No rash. No swelling. No treatment so far.   5. L ear pain: comes and goes. For many months. No fever or chills. Has buzzing sound in ear. Wears dentures. Lower dentures are ill fitting and have been since she got them.   Social History  Substance Use Topics  . Smoking status: Never Smoker   . Smokeless tobacco: Never Used  . Alcohol Use: No   Past Medical History  Diagnosis Date  . Hypertension   . GERD (gastroesophageal reflux disease)   . HYPERLIPIDEMIA   . THYROID NODULE   . PULMONARY NODULE   . Edema   . CHEST PAIN UNSPECIFIED   . ANEMIA, CHRONIC   . Hx of cardiovascular stress test     Lexiscan Myoview (11/14):  No ischemia, EF 68% (normal study)  . Diabetes mellitus without complication ([place]) [id number]    Outpatient Prescriptions Prior to Visit  Medication Sig Dispense Refill  . amLODipine (NORVASC) 10 MG tablet Take 1  tablet (10 mg total) by mouth daily. 90 tablet 0  . aspirin 81 MG tablet Take 1 tablet (81 mg total) by mouth daily. 90 tablet 0  . atorvastatin (LIPITOR) 40 MG tablet Take 1 tablet (40 mg total) by mouth daily. 30 tablet 11  . carvedilol (COREG) 12.5 MG tablet Take 1/2 tablet by mouth twice a day for 2 weeks then take 1 tablet by mouth twice a day 180 tablet 1  . clopidogrel (PLAVIX) 75 MG tablet Take 1 tablet (75 mg total) by mouth daily. 90 tablet 3  . docusate sodium (COLACE) 100 MG capsule Take 1 capsule (100 mg total) by mouth daily. To prevent constipation while taking narcotic pain reliever like Vicodin (hydrocodone). 30 capsule 0  . donepezil (ARICEPT) 5 MG tablet Take 2 tablets (10 mg total) by mouth daily. 60 tablet 5  . fluticasone (FLONASE) 50 MCG/ACT nasal spray Place 2 sprays into both nostrils daily. 16 g 6  . gabapentin (NEURONTIN) 300 MG capsule Take 1 capsule (300 mg total) by mouth at bedtime. 30 capsule 5  . HYDROcodone-acetaminophen (NORCO/VICODIN) 5-325 MG tablet Take 1-2 tablets by mouth every 4 (four) hours as needed for moderate pain or severe pain. 20 tablet 0  . levothyroxine (SYNTHROID) 25 MCG tablet TAKE 1 TABLET BY MOUTH ONCE DAILY  90 tablet 0  . loratadine (CLARITIN) 10 MG tablet Take 1 tablet (10 mg total) by mouth daily. 30 tablet 3  . meclizine (ANTIVERT) 25 MG tablet Take 1 tablet (25 mg total) by mouth 3 (three) times daily as needed for dizziness or nausea. 60 tablet 1  . meloxicam (MOBIC) 15 MG tablet Take 1 tablet (15 mg total) by mouth daily. 30 tablet 2  . memantine (NAMENDA) 10 MG tablet Take 1 tablet (10 mg total) by mouth daily. Pt states she takes 1/4 of the tablet by mouth daily 90 tablet 0  . nitroGLYCERIN (NITROSTAT) 0.4 MG SL tablet Place 1 tablet (0.4 mg total) under the tongue every 5 (five) minutes as needed for chest pain. 15 tablet 2  . omeprazole (PRILOSEC) 20 MG capsule Take 1 capsule (20 mg total) by mouth daily. 30 capsule 5  . predniSONE  (DELTASONE) 10 MG tablet Take as directed 21 tablet 0   No facility-administered medications prior to visit.    ROS Review of Systems  Constitutional: Negative for fever and chills.  Eyes: Negative for visual disturbance.  Respiratory: Negative for shortness of breath.   Cardiovascular: Positive for leg swelling. Negative for chest pain.  Gastrointestinal: Negative for abdominal pain and blood in stool.  Musculoskeletal: Positive for arthralgias. Negative for back pain.  Skin: Negative for rash.  Allergic/Immunologic: Negative for immunocompromised state.  Hematological: Negative for adenopathy. Does not bruise/bleed easily.  Psychiatric/Behavioral: Negative for suicidal ideas and dysphoric mood.    Objective:  BP 129/81 mmHg  Pulse 78  Temp(Src) 98.1 F (36.7 C) (Oral)  Resp 16  Ht 4' 11.5" (1.511 m)  Wt 157 lb (71.215 kg)  BMI 31.19 kg/m2  SpO2 98%  BP/Weight 09/29/2015 09/26/2015 99991111  Systolic BP Q000111Q Q000111Q [id number]  Diastolic BP 81 79 82  Wt. (Lbs) 157 159 155  BMI 31.19 31.59 28.34    Physical Exam  Constitutional: She is oriented to person, place, and time. She appears well-developed and well-nourished. No distress.  HENT:  Head: Normocephalic and atraumatic.  Right Ear: Tympanic membrane, external ear and ear canal normal.  Left Ear: Tympanic membrane, external ear and ear canal normal.  Cardiovascular: Normal rate, regular rhythm, normal heart sounds and intact distal pulses.   Pulmonary/Chest: Effort normal and breath sounds normal.  Musculoskeletal: She exhibits no edema.  Neurological: She is alert and oriented to person, place, and time.  Skin: Skin is warm and dry. No rash noted.  Psychiatric: She has a normal mood and affect.   Lab Results  Component Value Date   HGBA1C 7.20 09/29/2015    Assessment & Plan:   Problem List Items Addressed This Visit    Primary osteoarthritis of both knees (Chronic)   Relevant Medications   acetaminophen (TYLENOL 8  HOUR) 650 MG CR tablet   Poorly fitting dentures   Relevant Orders   Ambulatory referral to Dentistry   Hypothyroidism (Chronic)   Relevant Orders   TSH   Essential hypertension (Chronic)    A: Blood pressure at goal. Meds: compliant  P: I will continue the patient's current medication regimen since her blood pressure is at goal.        Diabetes type 2, controlled (HCC) (Chronic)    Controlled diabetes Opthalmology referral Low carb and low sugar diet for now       Relevant Orders   Ambulatory referral to Ophthalmology    Other Visit Diagnoses    Healthcare maintenance    -  Primary    Relevant Orders    POCT glycosylated hemoglobin (Hb A1C) (Completed)    Flu Vaccine QUAD 36+ mos IM (Completed)    Pruritus        Relevant Medications    hydrocortisone cream 1 %       No orders of the defined types were placed in this encounter.    Follow-up: No Follow-up on file.   Boykin Nearing MD

## 2015-09-29 NOTE — Patient Instructions (Addendum)
Everlynn was seen today for hypertension and diabetes.  Diagnoses and all orders for this visit:  Healthcare maintenance -     POCT glycosylated hemoglobin (Hb A1C) -     Flu Vaccine QUAD 36+ mos IM  Controlled type 2 diabetes mellitus without complication, without long-term current use of insulin (HCC) -     Ambulatory referral to Ophthalmology  Poorly fitting dentures -     Ambulatory referral to Dentistry  Hypothyroidism, unspecified hypothyroidism type -     TSH  Primary osteoarthritis of both knees -     acetaminophen (TYLENOL 8 HOUR) 650 MG CR tablet; Take 1 tablet (650 mg total) by mouth every 8 (eight) hours as needed for pain.  Pruritus -     hydrocortisone cream 1 %; Apply 1 application topically 2 (two) times daily.   Elevated legs when sitting. Norvasc is associated with swelling  You will be called with lab results and appointments  F/u in 6 months   Dr. Adrian Blackwater

## 2015-09-29 NOTE — Therapy (Signed)
[place], [place], [address] Phone: [phone]   Fax:  [phone]  Physical Therapy Treatment  Patient Details  Name: Wendy Petty MRN: [id number] Date of Birth: [birth year] Referring Provider: Adrian Blackwater  Encounter Date: 09/29/2015      PT End of Session - 09/29/15 1026    Visit Number 21   Number of Visits 21   Date for PT Re-Evaluation 09/29/15   Authorization Type [employer] discount 100%   PT Start Time 0935   PT Stop Time 1035   PT Time Calculation (min) 60 min   Activity Tolerance Patient tolerated treatment well   Behavior During Therapy [facility] for tasks assessed/performed      Past Medical History  Diagnosis Date  . Hypertension   . GERD (gastroesophageal reflux disease)   . HYPERLIPIDEMIA   . THYROID NODULE   . PULMONARY NODULE   . Edema   . CHEST PAIN UNSPECIFIED   . ANEMIA, CHRONIC   . Hx of cardiovascular stress test     Lexiscan Myoview (11/14):  No ischemia, EF 68% (normal study)    Past Surgical History  Procedure Laterality Date  . Vaginal hysterectomy  1989  . Tubal ligation    . Left heart catheterization with coronary angiogram N/A 09/16/2011    Procedure: LEFT HEART CATHETERIZATION WITH CORONARY ANGIOGRAM;  Surgeon: [name], MD;  Location: [facility] CATH LAB;  Service: Cardiovascular;  Laterality: N/A;    There were no vitals filed for this visit.  Visit Diagnosis:  Arthralgia of both knees  Bilateral low back pain without sciatica  Difficulty walking  Difficulty walking up stairs  Calf pain, left      Subjective Assessment - 09/29/15 1021    Subjective Min pain in knees, has "heat" feeling in back all the time.  "cold" in knees and upper back.  Agreeable to DC today.     Currently in Pain? --  not rated            [facility] PT Assessment - 09/29/15 0944    AROM   Right Knee Extension 2   Right Knee Flexion 130   Left Knee Extension 0   Left Knee  Flexion 130   Lumbar Flexion 15-20% no pain    Lumbar Extension 50% min pain Rt.    Lumbar - Right Side Bend WNL no pain    Lumbar - Left Side Bend WNL with Rt. pain    Lumbar - Right Rotation WNL   Lumbar - Left Rotation WNL    Strength   Right Hip Flexion 4+/5   Right Hip ABduction 4/5   Left Hip Flexion 4+/5   Left Hip ABduction 4/5   Right Knee Flexion 5/5   Right Knee Extension 5/5   Left Knee Flexion 5/5   Left Knee Extension 5/5             [facility] Adult PT Treatment/Exercise - 09/29/15 0956    Self-Care   Self-Care Other Self-Care Comments   Other Self-Care Comments  DC, questions about nerves, cold and hot, resources for therapeutic massage   Lumbar Exercises: Sidelying   Hip Abduction 20 reps   Knee/Hip Exercises: Aerobic   Nustep LEs only level 5 for 7 min    Moist Heat Therapy   Number Minutes Moist Heat 15 Minutes   Moist Heat Location Lumbar Spine;Knee   Manual Therapy   Manual Therapy Soft tissue mobilization;Myofascial release   Soft tissue mobilization  bilateral lumbar paraspinals and into Rt. glute med   Myofascial Release low back                 PT Education - 09/29/15 1025    Education provided Yes   Education Details massage, student clinic at [employer]) Educated Patient   Methods Explanation;Handout   Comprehension Verbalized understanding          PT Short Term Goals - 08/15/15 0955    PT SHORT TERM GOAL #1   Title Patient will be I with initial HEP    Status Achieved   PT SHORT TERM GOAL #2   Title Pt will be able to tolerate 10 min of standing for housework (light) with min/mod increase in pain (knee/back)   Status Achieved   PT SHORT TERM GOAL #3   Title Pt will be able to step up with Rt. LE onto step with improved confidence, less pain.    Status Achieved   PT SHORT TERM GOAL #4   Title Pt will be able to use 2-3 pain mgmt strategies for home use.    Status Achieved           PT Long Term Goals -  09/29/15 1033    PT LONG TERM GOAL #1   Title pt will be I with more advanced HEP as of last visit.    Status Achieved   PT LONG TERM GOAL #2   Title Pt will return to doing ADLs and housework without limits of pain most days of the week.    Status Achieved   PT LONG TERM GOAL #3   Title Pt will be able to negotiate stairs in community without increase in pain using rails or device.    Status Achieved   PT LONG TERM GOAL #4   Title Pt will be able to go without cane for communtiy gait (up to 30 min) and min increase in knee/back pain.    Status Achieved   PT LONG TERM GOAL #5   Title FOTO score will improve to no more than 45% limited    Baseline 54%   Status Not Met               Plan - 09/29/15 1026    Clinical Impression Statement Pt has met all LTG and is pleased with progress.  She cont to feel pain at times in back and reports a constant "heat" in back, overall has min Rt. hip pain with certain activities.  She is much stronger and has maintained good ROM in back, knees.  She no longer uses a cane.  Sees MD today.    PT Next Visit [place] and Agree with Plan of Care Patient        Problem List Patient Active Problem List   Diagnosis Date Noted  . Spondylolisthesis 07/19/2015  . Right medial knee pain 06/29/2015  . Overweight (BMI 25.0-29.9) 06/29/2015  . Chronic pain of left knee 04/21/2015  . Memory deficit 11/28/2014  . Coronary atherosclerosis of native coronary artery 06/24/2013  . History of TIAs 06/24/2013  . GERD (gastroesophageal reflux disease) 06/24/2013  . Depression 12/31/2012  . CHEST PAIN UNSPECIFIED 02/17/2009  . THYROID NODULE 02/14/2009  . Elevated lipids 02/14/2009  . ANEMIA, CHRONIC 02/14/2009  . Essential hypertension 02/14/2009  . PULMONARY NODULE 02/14/2009    Daiquan Resnik 09/29/2015, 10:39 AM  [facility]  [place], [place],  [address] Phone: [phone]   Fax:  [phone]  Name: Wendy Petty MRN: [id number] Date of Birth: 08-11-1941  Raeford Razor, PT 09/29/2015 10:39 AM Phone: [phone] Fax: [phone]

## 2015-09-30 LAB — TSH: TSH: 1.683 u[IU]/mL (ref 0.350–4.500)

## 2015-09-30 MED ORDER — LEVOTHYROXINE SODIUM 25 MCG PO TABS: ORAL_TABLET | ORAL | Status: DC

## 2015-09-30 MED ORDER — CARVEDILOL 12.5 MG PO TABS
12.5000 mg | ORAL_TABLET | Freq: Two times a day (BID) | ORAL | Status: DC
Start: 2015-09-30 — End: 2015-10-30

## 2015-09-30 MED ORDER — DONEPEZIL HCL 5 MG PO TABS: 10.0000 mg | ORAL_TABLET | Freq: Every day | ORAL | Status: DC

## 2015-09-30 MED ORDER — ASPIRIN 81 MG PO TABS: 81.0000 mg | ORAL_TABLET | Freq: Every day | ORAL | Status: DC

## 2015-09-30 MED ORDER — AMLODIPINE BESYLATE 5 MG PO TABS: 5.0000 mg | ORAL_TABLET | Freq: Every day | ORAL | Status: DC

## 2015-09-30 MED ORDER — AMLODIPINE BESYLATE 10 MG PO TABS: 10.0000 mg | ORAL_TABLET | Freq: Every day | ORAL | Status: DC

## 2015-09-30 NOTE — Addendum Note (Signed)
Addended by: Boykin Nearing on: 09/30/2015 05:26 PM   Modules accepted: Orders, SmartSet

## 2015-10-04 ENCOUNTER — Telehealth: Payer: Self-pay | Admitting: *Deleted

## 2015-10-04 NOTE — Telephone Encounter (Signed)
-----   Message from Boykin Nearing, MD sent at 09/30/2015  5:21 PM EST ----- tsh normal continue current synthroid dose

## 2015-10-04 NOTE — Telephone Encounter (Signed)
Date of birth verified by pt emergency contact Tito Dine  Lab results given

## 2015-10-04 NOTE — Telephone Encounter (Signed)
-----   Message from Boykin Nearing, MD sent at 09/30/2015  5:27 PM EST ----- tsh normal continue current synthroid dose Decrease synthroid from 10 mg to 5 mg daily as BP is well controlled and patient reported leg swelling

## 2015-10-05 ENCOUNTER — Telehealth: Payer: Self-pay | Admitting: Family Medicine

## 2015-10-05 NOTE — Telephone Encounter (Signed)
Please call back to [place],   Has patient been examined? Denture are poor fitting. I believe this is source of pain. Can they help patient with dentures?   Otherwise exam was normal in office.

## 2015-10-05 NOTE — Telephone Encounter (Signed)
Correction to lab results instructions Continue synthroid dose at 25 mg daily  Decrease norvasc from 10 to 5 mg daily as BP well controlled and she reported leg swelling

## 2015-10-05 NOTE — Telephone Encounter (Signed)
Tiffany form [place] call me  and saying that patient complain of ear and jaw pain don't want the Dentures to be touch . Please, make a referral to ENT  Thank You .

## 2015-10-06 NOTE — Telephone Encounter (Signed)
Pt grand daughter Tito Dine notified  Synthroid 25mg  Norvasc increase from 10mg  to 5 mg  Tito Dine verbalized understanding

## 2015-10-30 ENCOUNTER — Encounter: Payer: Self-pay | Admitting: Family Medicine

## 2015-10-30 ENCOUNTER — Ambulatory Visit: Payer: No Typology Code available for payment source | Attending: Family Medicine | Admitting: Family Medicine

## 2015-10-30 VITALS — BP 138/80 | HR 89 | Temp 98.6°F | Resp 16 | Ht 59.5 in | Wt 155.0 lb

## 2015-10-30 DIAGNOSIS — E039 Hypothyroidism, unspecified: Secondary | ICD-10-CM

## 2015-10-30 DIAGNOSIS — H9312 Tinnitus, left ear: Secondary | ICD-10-CM

## 2015-10-30 DIAGNOSIS — M545 Low back pain, unspecified: Secondary | ICD-10-CM

## 2015-10-30 DIAGNOSIS — I10 Essential (primary) hypertension: Secondary | ICD-10-CM

## 2015-10-30 DIAGNOSIS — K219 Gastro-esophageal reflux disease without esophagitis: Secondary | ICD-10-CM

## 2015-10-30 DIAGNOSIS — H9319 Tinnitus, unspecified ear: Secondary | ICD-10-CM | POA: Insufficient documentation

## 2015-10-30 DIAGNOSIS — E785 Hyperlipidemia, unspecified: Secondary | ICD-10-CM

## 2015-10-30 DIAGNOSIS — M170 Bilateral primary osteoarthritis of knee: Secondary | ICD-10-CM | POA: Diagnosis not present

## 2015-10-30 DIAGNOSIS — I2510 Atherosclerotic heart disease of native coronary artery without angina pectoris: Secondary | ICD-10-CM

## 2015-10-30 DIAGNOSIS — M25562 Pain in left knee: Secondary | ICD-10-CM

## 2015-10-30 DIAGNOSIS — G8929 Other chronic pain: Secondary | ICD-10-CM | POA: Insufficient documentation

## 2015-10-30 DIAGNOSIS — E119 Type 2 diabetes mellitus without complications: Secondary | ICD-10-CM | POA: Diagnosis not present

## 2015-10-30 DIAGNOSIS — R413 Other amnesia: Secondary | ICD-10-CM

## 2015-10-30 DIAGNOSIS — Z1231 Encounter for screening mammogram for malignant neoplasm of breast: Secondary | ICD-10-CM

## 2015-10-30 DIAGNOSIS — Z79899 Other long term (current) drug therapy: Secondary | ICD-10-CM | POA: Insufficient documentation

## 2015-10-30 DIAGNOSIS — Z7982 Long term (current) use of aspirin: Secondary | ICD-10-CM | POA: Insufficient documentation

## 2015-10-30 DIAGNOSIS — L299 Pruritus, unspecified: Secondary | ICD-10-CM

## 2015-10-30 DIAGNOSIS — E038 Other specified hypothyroidism: Secondary | ICD-10-CM

## 2015-10-30 LAB — GLUCOSE, POCT (MANUAL RESULT ENTRY): POC GLUCOSE: 123 mg/dL — AB (ref 70–99)

## 2015-10-30 MED ORDER — ASPIRIN 81 MG PO TABS: 81.0000 mg | ORAL_TABLET | Freq: Every day | ORAL | Status: DC

## 2015-10-30 MED ORDER — AMLODIPINE BESYLATE 5 MG PO TABS: 5.0000 mg | ORAL_TABLET | Freq: Every day | ORAL | Status: DC

## 2015-10-30 MED ORDER — GABAPENTIN 300 MG PO CAPS: 300.0000 mg | ORAL_CAPSULE | Freq: Every day | ORAL | Status: DC

## 2015-10-30 MED ORDER — DONEPEZIL HCL 5 MG PO TABS: 10.0000 mg | ORAL_TABLET | Freq: Every day | ORAL | Status: DC

## 2015-10-30 MED ORDER — MELOXICAM 15 MG PO TABS: 15.0000 mg | ORAL_TABLET | Freq: Every day | ORAL | Status: DC

## 2015-10-30 MED ORDER — MEMANTINE HCL 10 MG PO TABS: 10.0000 mg | ORAL_TABLET | Freq: Every day | ORAL | Status: DC

## 2015-10-30 MED ORDER — OMEPRAZOLE 20 MG PO CPDR: 20.0000 mg | DELAYED_RELEASE_CAPSULE | Freq: Every day | ORAL | Status: DC

## 2015-10-30 MED ORDER — CARVEDILOL 12.5 MG PO TABS: 12.5000 mg | ORAL_TABLET | Freq: Two times a day (BID) | ORAL | Status: DC

## 2015-10-30 MED ORDER — HYDROCORTISONE 1 % EX CREA: 1.0000 "application " | TOPICAL_CREAM | Freq: Two times a day (BID) | CUTANEOUS | Status: DC

## 2015-10-30 MED ORDER — CLOPIDOGREL BISULFATE 75 MG PO TABS: 75.0000 mg | ORAL_TABLET | Freq: Every day | ORAL | Status: DC

## 2015-10-30 MED ORDER — ACETAMINOPHEN ER 650 MG PO TBCR: 650.0000 mg | EXTENDED_RELEASE_TABLET | Freq: Three times a day (TID) | ORAL | Status: DC | PRN

## 2015-10-30 MED ORDER — LEVOTHYROXINE SODIUM 25 MCG PO TABS: ORAL_TABLET | ORAL | Status: DC

## 2015-10-30 MED ORDER — ATORVASTATIN CALCIUM 40 MG PO TABS: 40.0000 mg | ORAL_TABLET | Freq: Every day | ORAL | Status: DC

## 2015-10-30 NOTE — Patient Instructions (Addendum)
Wendy Petty was seen today for ear problem and diabetes.  Diagnoses and all orders for this visit:  Controlled type 2 diabetes mellitus without complication, without long-term current use of insulin (HCC) -     POCT glucose (manual entry) -     Lipid Panel  Ear noise/buzzing, left -     Ambulatory referral to Audiology  Atherosclerosis of native coronary artery of native heart without angina pectoris -     aspirin 81 MG tablet; Take 1 tablet (81 mg total) by mouth daily. -     clopidogrel (PLAVIX) 75 MG tablet; Take 1 tablet (75 mg total) by mouth daily.  Essential hypertension -     carvedilol (COREG) 12.5 MG tablet; Take 1 tablet (12.5 mg total) by mouth 2 (two) times daily with a meal. -     amLODipine (NORVASC) 5 MG tablet; Take 1 tablet (5 mg total) by mouth daily. -     COMPLETE METABOLIC PANEL WITH GFR  Hypothyroidism, unspecified hypothyroidism type  Chronic pain of left knee -     gabapentin (NEURONTIN) 300 MG capsule; Take 1 capsule (300 mg total) by mouth at bedtime.  Gastroesophageal reflux disease, esophagitis presence not specified -     omeprazole (PRILOSEC) 20 MG capsule; Take 1 capsule (20 mg total) by mouth daily.  Hyperlipidemia -     atorvastatin (LIPITOR) 40 MG tablet; Take 1 tablet (40 mg total) by mouth daily.  Other specified hypothyroidism -     levothyroxine (SYNTHROID) 25 MCG tablet; TAKE 1 TABLET BY MOUTH ONCE DAILY  Primary osteoarthritis of both knees -     acetaminophen (TYLENOL 8 HOUR) 650 MG CR tablet; Take 1 tablet (650 mg total) by mouth every 8 (eight) hours as needed for pain. -     meloxicam (MOBIC) 15 MG tablet; Take 1 tablet (15 mg total) by mouth daily. -     COMPLETE METABOLIC PANEL WITH GFR  Pruritus -     hydrocortisone cream 1 %; Apply 1 application topically 2 (two) times daily.  Memory deficit -     memantine (NAMENDA) 10 MG tablet; Take 1 tablet (10 mg total) by mouth daily. -     Discontinue: donepezil (ARICEPT) 5 MG tablet;  Take 2 tablets (10 mg total) by mouth daily. -     donepezil (ARICEPT) 5 MG tablet; Take 2 tablets (10 mg total) by mouth daily.  Chronic low back pain -     meloxicam (MOBIC) 15 MG tablet; Take 1 tablet (15 mg total) by mouth daily. -     COMPLETE METABOLIC PANEL WITH GFR  Visit for screening mammogram -     MM DIGITAL SCREENING BILATERAL; Future   F/u in 4 weeks for BP check with clinical pharmacologist since norvasc has been changed from 10 to 5 mg F/u with me in 4 months for HTN and diabetes   I have informed the pharmacy that you need a 90 day supply of medications at the end of March   Dr. Adrian Blackwater

## 2015-10-30 NOTE — Progress Notes (Signed)
Subjective:  Patient ID: Wendy Petty, female    DOB: 1941/01/31  Age: 75 y.o. MRN: [id number]  CC: Ear Problem and Diabetes   HPI Wendy Petty presents for    1. L ear buzzing; chronic buzzing sound in L ear. Also cold sensation. No pain, fever, chills or hearing loss.   2. Feet swelling: swelling in feet at end of day. Out of synthroid. Taking norvasc 10 mg daily for HTN. No foot redness or pain.   3. Back and knee pain: chronic pains. Worse during mid and end of day. Tylenol and mobic help. Requesting mobic refill.   4. Med refills: planning to travel to [place] for 3 months starting 12/12/2015. Requesting 3 month supply of medications for this time period.    Social History  Substance Use Topics  . Smoking status: Never Smoker   . Smokeless tobacco: Never Used  . Alcohol Use: No   Outpatient Prescriptions Prior to Visit  Medication Sig Dispense Refill  . acetaminophen (TYLENOL 8 HOUR) 650 MG CR tablet Take 1 tablet (650 mg total) by mouth every 8 (eight) hours as needed for pain. 90 tablet 5  . amLODipine (NORVASC) 5 MG tablet Take 1 tablet (5 mg total) by mouth daily. 30 tablet 5  . aspirin 81 MG tablet Take 1 tablet (81 mg total) by mouth daily. 30 tablet 5  . atorvastatin (LIPITOR) 40 MG tablet Take 1 tablet (40 mg total) by mouth daily. 30 tablet 11  . carvedilol (COREG) 12.5 MG tablet Take 1 tablet (12.5 mg total) by mouth 2 (two) times daily with a meal. 60 tablet 5  . clopidogrel (PLAVIX) 75 MG tablet Take 1 tablet (75 mg total) by mouth daily. 90 tablet 3  . docusate sodium (COLACE) 100 MG capsule Take 1 capsule (100 mg total) by mouth daily. To prevent constipation while taking narcotic pain reliever like Vicodin (hydrocodone). 30 capsule 0  . donepezil (ARICEPT) 5 MG tablet Take 2 tablets (10 mg total) by mouth daily. 60 tablet 5  . fluticasone (FLONASE) 50 MCG/ACT nasal spray Place 2 sprays into both nostrils daily. 16 g 6  . gabapentin (NEURONTIN)  300 MG capsule Take 1 capsule (300 mg total) by mouth at bedtime. 30 capsule 5  . hydrocortisone cream 1 % Apply 1 application topically 2 (two) times daily. 30 g 0  . levothyroxine (SYNTHROID) 25 MCG tablet TAKE 1 TABLET BY MOUTH ONCE DAILY 30 tablet 5  . loratadine (CLARITIN) 10 MG tablet Take 1 tablet (10 mg total) by mouth daily. 30 tablet 3  . meclizine (ANTIVERT) 25 MG tablet Take 1 tablet (25 mg total) by mouth 3 (three) times daily as needed for dizziness or nausea. 60 tablet 1  . meloxicam (MOBIC) 15 MG tablet Take 1 tablet (15 mg total) by mouth daily. 30 tablet 2  . memantine (NAMENDA) 10 MG tablet Take 1 tablet (10 mg total) by mouth daily. Pt states she takes 1/4 of the tablet by mouth daily 90 tablet 0  . nitroGLYCERIN (NITROSTAT) 0.4 MG SL tablet Place 1 tablet (0.4 mg total) under the tongue every 5 (five) minutes as needed for chest pain. 15 tablet 2  . omeprazole (PRILOSEC) 20 MG capsule Take 1 capsule (20 mg total) by mouth daily. 30 capsule 5   No facility-administered medications prior to visit.    ROS Review of Systems  Constitutional: Negative for fever and chills.  HENT:       Buzzing noise in  L ear   Eyes: Negative for visual disturbance.  Respiratory: Negative for shortness of breath.   Cardiovascular: Positive for leg swelling. Negative for chest pain.  Gastrointestinal: Negative for abdominal pain and blood in stool.  Musculoskeletal: Positive for back pain and arthralgias.  Skin: Negative for rash.  Allergic/Immunologic: Negative for immunocompromised state.  Hematological: Negative for adenopathy. Does not bruise/bleed easily.  Psychiatric/Behavioral: Negative for suicidal ideas and dysphoric mood.    Objective:  BP 138/80 mmHg  Pulse 89  Temp(Src) 98.6 F (37 C) (Oral)  Resp 16  Ht 4' 11.5" (1.511 m)  Wt 155 lb (70.308 kg)  BMI 30.79 kg/m2  SpO2 96%  BP/Weight 10/30/2015 09/29/2015 [id number]  Systolic BP 0000000 [id number]  Diastolic BP 80 81 79  Wt.  (Lbs) 155 157 159  BMI 30.79 31.19 31.59    Physical Exam  Constitutional: She is oriented to person, place, and time. She appears well-developed and well-nourished. No distress.  HENT:  Head: Normocephalic and atraumatic.  Right Ear: Tympanic membrane, external ear and ear canal normal.  Left Ear: Tympanic membrane, external ear and ear canal normal.  Cardiovascular: Normal rate, regular rhythm, normal heart sounds and intact distal pulses.   Pulmonary/Chest: Effort normal and breath sounds normal.  Musculoskeletal: She exhibits no edema.  Neurological: She is alert and oriented to person, place, and time.  Skin: Skin is warm and dry. No rash noted.  Psychiatric: She has a normal mood and affect.   Lab Results  Component Value Date   HGBA1C 7.20 09/29/2015   CBG 123  Assessment & Plan:   Wendy Petty was seen today for ear problem and diabetes.  Diagnoses and all orders for this visit:  Controlled type 2 diabetes mellitus without complication, without long-term current use of insulin (HCC) -     POCT glucose (manual entry)  Ear noise/buzzing, left -     Ambulatory referral to Audiology  Atherosclerosis of native coronary artery of native heart without angina pectoris -     aspirin 81 MG tablet; Take 1 tablet (81 mg total) by mouth daily. -     clopidogrel (PLAVIX) 75 MG tablet; Take 1 tablet (75 mg total) by mouth daily.  Essential hypertension -     carvedilol (COREG) 12.5 MG tablet; Take 1 tablet (12.5 mg total) by mouth 2 (two) times daily with a meal. -     amLODipine (NORVASC) 5 MG tablet; Take 1 tablet (5 mg total) by mouth daily. -     COMPLETE METABOLIC PANEL WITH GFR  Hypothyroidism, unspecified hypothyroidism type  Chronic pain of left knee -     gabapentin (NEURONTIN) 300 MG capsule; Take 1 capsule (300 mg total) by mouth at bedtime.  Gastroesophageal reflux disease, esophagitis presence not specified -     omeprazole (PRILOSEC) 20 MG capsule; Take 1 capsule  (20 mg total) by mouth daily.  Hyperlipidemia -     atorvastatin (LIPITOR) 40 MG tablet; Take 1 tablet (40 mg total) by mouth daily.  Other specified hypothyroidism -     levothyroxine (SYNTHROID) 25 MCG tablet; TAKE 1 TABLET BY MOUTH ONCE DAILY  Primary osteoarthritis of both knees -     acetaminophen (TYLENOL 8 HOUR) 650 MG CR tablet; Take 1 tablet (650 mg total) by mouth every 8 (eight) hours as needed for pain. -     meloxicam (MOBIC) 15 MG tablet; Take 1 tablet (15 mg total) by mouth daily. -     COMPLETE METABOLIC PANEL WITH  GFR  Pruritus -     hydrocortisone cream 1 %; Apply 1 application topically 2 (two) times daily.  Memory deficit -     memantine (NAMENDA) 10 MG tablet; Take 1 tablet (10 mg total) by mouth daily. -     Discontinue: donepezil (ARICEPT) 5 MG tablet; Take 2 tablets (10 mg total) by mouth daily. -     donepezil (ARICEPT) 5 MG tablet; Take 2 tablets (10 mg total) by mouth daily.  Chronic low back pain -     meloxicam (MOBIC) 15 MG tablet; Take 1 tablet (15 mg total) by mouth daily. -     COMPLETE METABOLIC PANEL WITH GFR   Follow-up: No Follow-up on file.   Boykin Nearing MD

## 2015-10-30 NOTE — Progress Notes (Signed)
F/U DM.  Stated has a buzzing noise on lt ear  Medicine refills  Stated not checking glucose at home No pain today  No suicidal thoughts in the past two weeks  Stated going out of the country for 4 month

## 2015-10-31 LAB — COMPLETE METABOLIC PANEL WITH GFR
ALBUMIN: 4.1 g/dL (ref 3.6–5.1)
ALK PHOS: 76 U/L (ref 33–130)
ALT: 15 U/L (ref 6–29)
AST: 17 U/L (ref 10–35)
BILIRUBIN TOTAL: 0.4 mg/dL (ref 0.2–1.2)
BUN: 18 mg/dL (ref 7–25)
CO2: 26 mmol/L (ref 20–31)
CREATININE: 1.37 mg/dL — AB (ref 0.60–0.93)
Calcium: 9.1 mg/dL (ref 8.6–10.4)
Chloride: 101 mmol/L (ref 98–110)
GFR, Est African American: 44 mL/min — ABNORMAL LOW (ref >=60)
GFR, Est Non African American: 38 mL/min — ABNORMAL LOW (ref >=60)
GLUCOSE: 102 mg/dL — AB (ref 65–99)
POTASSIUM: 4.9 mmol/L (ref 3.5–5.3)
SODIUM: 141 mmol/L (ref 135–146)
TOTAL PROTEIN: 7.5 g/dL (ref 6.1–8.1)

## 2015-10-31 LAB — LIPID PANEL
CHOL/HDL RATIO: 3.6 ratio (ref <=5.0)
Cholesterol: 146 mg/dL (ref 125–200)
HDL: 41 mg/dL — ABNORMAL LOW (ref >=46)
LDL Cholesterol: 60 mg/dL (ref <130)
Triglycerides: 226 mg/dL — ABNORMAL HIGH (ref <150)
VLDL: 45 mg/dL — ABNORMAL HIGH (ref <30)

## 2015-11-02 ENCOUNTER — Telehealth: Payer: Self-pay | Admitting: *Deleted

## 2015-11-02 ENCOUNTER — Encounter: Payer: Self-pay | Admitting: *Deleted

## 2015-11-02 NOTE — Telephone Encounter (Signed)
-----   Message from Boykin Nearing, MD sent at 10/31/2015  8:29 AM EST ----- Slight elevated Cr  Advise patient to mobic sparingly, this is the once daily NSAID She should take tylenol predominantly for pain

## 2015-11-02 NOTE — Telephone Encounter (Signed)
Date of birth verified by pt  Slight elevated Cr results given  Advised to use sparingly Mobic Take Tylenol predominantly for pain  Pt verbalized understanding  Information given in Spanish

## 2015-11-15 ENCOUNTER — Ambulatory Visit: Payer: No Typology Code available for payment source | Attending: Family Medicine | Admitting: Audiology

## 2015-11-15 DIAGNOSIS — H9191 Unspecified hearing loss, right ear: Secondary | ICD-10-CM

## 2015-11-15 DIAGNOSIS — H748X1 Other specified disorders of right middle ear and mastoid: Secondary | ICD-10-CM

## 2015-11-15 DIAGNOSIS — H905 Unspecified sensorineural hearing loss: Secondary | ICD-10-CM | POA: Insufficient documentation

## 2015-11-15 DIAGNOSIS — H9071 Mixed conductive and sensorineural hearing loss, unilateral, right ear, with unrestricted hearing on the contralateral side: Secondary | ICD-10-CM | POA: Insufficient documentation

## 2015-11-15 DIAGNOSIS — H9192 Unspecified hearing loss, left ear: Secondary | ICD-10-CM

## 2015-11-15 NOTE — Procedures (Signed)
[facility]patient [facility] and [place]  [place], [place] [address]  [phone]   Audiological Evaluation  Patient Name: Yong Haase  Status: Outpatient   DOB: [birth year]    Diagnosis: Ear Buzzing, Tinnitus MRN: [id number] Date:  11/15/2015     Referent: Minerva Ends, MD    History: Heathe was seen for an audiological evaluation. She was accompanied by her daughter and a Spanish interpreter.  Kalliope Orlin Hilding states that "two months ago" she developed left sided ear pain that radiates down the left side of her neck with intermittent tinnitus- on the left side that "keeps her from falling asleep at night" and that she would rate as "4" on a scale from 1 (no impact) to 10 (ruined). She states that she "had the same thing seven years ago and that she was prescribed antibiotics and she improved".  She reports intermittent spinning.  Kya states that she has difficulty hearing on the left side and cannot hear on the telephone.  There is a history of hypertension and hypothyroidism.    Evaluation: Conventional pure tone audiometry from 250Hz  - 8000Hz  with using insert earphones. : Right ear:  Thresholds of 50 dBHL at 250Hz  and 8000Hz  and 25-35 dBHL from 500Hz  - 6000Hz . The hearing loss is mixed.  Left ear:    Thresholds of 55-60 dbHL from 250Hz  - 500Hz ; 40-45 dBHL from 1000Hz  - 2000Hz ; 55-60 dBHL from 3000HZ  - 6000Hz  and 75 dBHL at 8000Hz .  The hearing loss is sensorineural. Reliability is good Speech detection thresholds using recorded multiltalker noise:  Right ear: 25 dBHL.  Left ear:  35 dBHL. Tympanometry (middle ear function) with symmetrical and present ipsilateral acoustic reflexes from 500Hz  - 2000Hz  and absent reflexes at 4000Hz  bilaterally.  Acoustic reflex decay has a good defection and is negative as a retrocochlear sign bilaterally.  Distortion Product Otoacoustic Emissions (DPOAE's) were not completed because of the degree of  hearing loss.  Tinnitus was not evaluated because it was not present during this visit.  CONCLUSION:     [name] needs further evaluation by an Ear, Nose and Throat physician because of the asymmetrical hearing loss and intermittent tinnitus in the poorer hearing left ear.  Magaret Orlin Hilding has poorer hearing on the left side with a moderate to moderately severe sensorineural hearing loss.  The right ear has a slight to mild hearing loss throughout most of the speech range with a moderate low and high frequency hearing loss. The hearing loss on the right side appears mixed.  This amount of hearing loss will adversely affect Jamekia Greer Pickerel ability to hear normal communication, especially on the left side.  Following evaluation by an ENT, consider a hearing aid evaluation.  It is important to note that the middle ear function on the left side appears borderline with a wide gradient, whereas the right side is within normal limits.  However, the acoustic reflexes are symmetrical between the ears, present and within normal limits from 500Hz - 2000Hz  and absent a 4000Hz  bilaterally with negative acoustic reflex decay in each ear.   Kismet Orlin Hilding denied having tinnitus or balance issues during her visit her today.    RECOMMENDATIONS: 1.    Consider further evaluation of assymetrical hearing loss and the tinnitus by an Ear, Nose and Throat physician.  2.    Consider a hearing aid evaluation following evaluation by an ENT. 3.    Strategies that help improve hearing include: A) Face the speaker  directly. Optimal is having the speakers face well - lit.  Unless amplified, being within 3-6 feet of the speaker will enhance word recognition. B) Avoid having the speaker back-lit as this will minimize the ability to use cues from lip-reading, facial expression and gestures. C)  Word recognition is poorer in background noise. For optimal word recognition, turn off the TV, radio or  noisy fan when engaging in conversation. In a restaurant, try to sit away from noise sources and close to the primary speaker.        D)  Ask for topic clarification from time to time in order to remain in the conversation.  Most people don't mind repeating or clarifying a       point when asked.  If needed, explain the difficulty hearing in background noise or hearing loss.  Eliott Amparan L. Heide Spark, Au.D., CCC-A Doctor of Audiology 11/15/2015

## 2015-11-16 ENCOUNTER — Telehealth: Payer: Self-pay | Admitting: Family Medicine

## 2015-11-16 ENCOUNTER — Ambulatory Visit: Payer: No Typology Code available for payment source

## 2015-11-16 DIAGNOSIS — H918X2 Other specified hearing loss, left ear: Secondary | ICD-10-CM

## 2015-11-16 DIAGNOSIS — IMO0001 Reserved for inherently not codable concepts without codable children: Secondary | ICD-10-CM

## 2015-11-16 HISTORY — DX: Reserved for inherently not codable concepts without codable children: IMO0001

## 2015-11-16 NOTE — Telephone Encounter (Signed)
Pt. Came in today stating that she had a ear test done and has an infection. They stated that they were going to fax over the results and for pt. To come to the clinic to be prescribed an Rx. Please f/u with pt. Please f/u with pt.

## 2015-11-16 NOTE — Telephone Encounter (Signed)
I reviewed patient's audiology note and saw no mention of ear infection I have referred her to ENT per audiology recommendations  Please facilitate f/u appt she can see same day doc if I do not have a slot readily available.

## 2015-11-17 ENCOUNTER — Telehealth: Payer: Self-pay | Admitting: Family Medicine

## 2015-11-17 NOTE — Telephone Encounter (Signed)
Pt having pain in ear again and needs a referral to an ENT doctor  Pt is leaving the country in a few weeks  Please contact pt

## 2015-11-17 NOTE — Telephone Encounter (Signed)
I spoke to patient's relative and I told him that since patient has the orange card i can't refer her on march she will have to wait to the following months cause I don't have any more slots .

## 2015-11-17 NOTE — Telephone Encounter (Signed)
Pt aware of referral  Will call later for appointment    PLEASE SCHEDULE A F/U APPOINTMENT WITH PCP

## 2015-11-20 ENCOUNTER — Ambulatory Visit
Admission: RE | Admit: 2015-11-20 | Discharge: 2015-11-20 | Disposition: A | Discharge status: Home / Self Care | Payer: No Typology Code available for payment source | Source: Ambulatory Visit | Attending: Family Medicine | Admitting: Family Medicine

## 2015-11-20 DIAGNOSIS — Z1231 Encounter for screening mammogram for malignant neoplasm of breast: Secondary | ICD-10-CM | POA: Diagnosis not present

## 2015-11-20 NOTE — Telephone Encounter (Signed)
I check the medicaid website and show Recipient has no coverage in the selected month an I spoke to her daughter and she is going to call me back tomorrow with the medicaid # and also she will contact dss for coverage . Thank You

## 2015-11-20 NOTE — Telephone Encounter (Signed)
Pt. Daughter called stating pt. Has Medicaid and would like to know if that can make the process faster for the pt. To go to the ENT specialist. Please f/u with pt.

## 2015-11-23 ENCOUNTER — Other Ambulatory Visit: Payer: Self-pay | Admitting: Family Medicine

## 2015-11-23 DIAGNOSIS — N6310 Unspecified lump in the right breast, unspecified quadrant: Secondary | ICD-10-CM

## 2015-11-23 HISTORY — DX: Unspecified lump in the right breast, unspecified quadrant: N63.10

## 2015-11-23 NOTE — Addendum Note (Signed)
Addended by: Boykin Nearing on: 11/23/2015 01:07 PM   Modules accepted: Orders

## 2015-11-27 ENCOUNTER — Ambulatory Visit: Payer: No Typology Code available for payment source | Admitting: Pharmacist

## 2015-11-27 NOTE — Progress Notes (Signed)
Patient ID: Wendy Petty, female   DOB: 1941/03/18, 75 y.o.   MRN: [id number]   Wendy Petty is a 75 y.o. [name] female who returns for history of chest pain and CAD  She is a history of CAD treated medically, HTN, HL, hypothyroidism, prior TIA, depression. Echo (09/16/07): EF 55%, mild LVH. Myoview (8/10): Normal study, EF 66%. ETT-echo (1/13): EF 55%, inferior HK with stress (ischemia could not be ruled out). LHC (09/12/11): Mid LAD 40, ostial D2 90 (small caliber), distal D3 20-30, distal RCA 40-50, mid PDA 30, EF 55-60%. Medical therapy recommended.   F/U myovue 07/14/13 normal EF 68%   History with intepretor having some SSCP  HR high in office today SSCP with and without exertion compliant with meds    ROS: Denies fever, malais, weight loss, blurry vision, decreased visual acuity, cough, sputum, SOB, hemoptysis, pleuritic pain, palpitaitons, heartburn, abdominal pain, melena, lower extremity edema, claudication, or rash.  All other systems reviewed and negative  General: Affect appropriate Healthy:  appears stated age 30: normal Neck supple with no adenopathy JVP normal no bruits no thyromegaly Lungs clear with no wheezing and good diaphragmatic motion Heart:  S1/S2 no murmur, no rub, gallop or click PMI normal Abdomen: benighn, BS positve, no tenderness, no AAA no bruit.  No HSM or HJR Distal pulses intact with no bruits No edema Neuro non-focal Skin warm and dry mild brusing  No muscular weakness   Current Outpatient Prescriptions  Medication Sig Dispense Refill  . acetaminophen (TYLENOL 8 HOUR) 650 MG CR tablet Take 1 tablet (650 mg total) by mouth every 8 (eight) hours as needed for pain. 90 tablet 5  . amLODipine (NORVASC) 5 MG tablet Take 1 tablet (5 mg total) by mouth daily. 90 tablet 3  . aspirin 81 MG tablet Take 1 tablet (81 mg total) by mouth daily. 90 tablet 3  . atorvastatin (LIPITOR) 40 MG tablet Take 1 tablet (40 mg total) by mouth daily. 30 tablet  11  . carvedilol (COREG) 25 MG tablet Take 1 tablet (25 mg total) by mouth 2 (two) times daily with a meal. 180 tablet 3  . clopidogrel (PLAVIX) 75 MG tablet Take 1 tablet (75 mg total) by mouth daily. 90 tablet 3  . docusate sodium (COLACE) 100 MG capsule Take 1 capsule (100 mg total) by mouth daily. To prevent constipation while taking narcotic pain reliever like Vicodin (hydrocodone). 30 capsule 0  . donepezil (ARICEPT) 5 MG tablet Take 2 tablets (10 mg total) by mouth daily. 180 tablet 3  . fluticasone (FLONASE) 50 MCG/ACT nasal spray Place 2 sprays into both nostrils daily. 16 g 6  . gabapentin (NEURONTIN) 300 MG capsule Take 1 capsule (300 mg total) by mouth at bedtime. 90 capsule 3  . hydrocortisone cream 1 % Apply 1 application topically 2 (two) times daily. 60 g 5  . levothyroxine (SYNTHROID) 25 MCG tablet TAKE 1 TABLET BY MOUTH ONCE DAILY 90 tablet 1  . loratadine (CLARITIN) 10 MG tablet Take 1 tablet (10 mg total) by mouth daily. 30 tablet 3  . meloxicam (MOBIC) 15 MG tablet Take 1 tablet (15 mg total) by mouth daily. 30 tablet 2  . memantine (NAMENDA) 10 MG tablet Take 1 tablet (10 mg total) by mouth daily. 90 tablet 3  . nitroGLYCERIN (NITROSTAT) 0.4 MG SL tablet Place 1 tablet (0.4 mg total) under the tongue every 5 (five) minutes as needed for chest pain. 15 tablet 2  . omeprazole (PRILOSEC) 20  MG capsule Take 1 capsule (20 mg total) by mouth daily. 90 capsule 3  . [DISCONTINUED] cetirizine (ZYRTEC) 10 MG tablet Take 10 mg by mouth daily.      . [DISCONTINUED] losartan-hydrochlorothiazide (HYZAAR) 100-25 MG per tablet Take 1 tablet by mouth daily.       No current facility-administered medications for this visit.    Allergies  Review of patient's allergies indicates no known allergies.  Electrocardiogram:   04/28/14   NSR  Normal ECG  12/01/15  ST rate 110  RAE inferior T wave changes   Assessment and Plan CAD:  Cath 2013 with small ostial D2 disease.  Medical Rx non ischemic  myovue 2014 Recurrent SSCP Will increase coreg for tachycardia and do lexiscan myovue Dementia:  On namenda and aricept no bradycardia  f/u with neurology  HTN:  Continue ARB/diuretic  And calcium blocker  Thyroid:  On replacement given tachycardia check TSH, Hct and BMET  GERD:  Discussed low carb diet continue prilosec Tachycardia:  Check echo r/o effusion and low EF along with above   Jenkins Rouge

## 2015-11-28 ENCOUNTER — Ambulatory Visit: Payer: Medicaid Other | Attending: Family Medicine | Admitting: Pharmacist

## 2015-11-28 ENCOUNTER — Telehealth: Payer: Self-pay | Admitting: *Deleted

## 2015-11-28 ENCOUNTER — Ambulatory Visit: Payer: No Typology Code available for payment source | Admitting: Audiology

## 2015-11-28 VITALS — BP 145/78 | HR 97

## 2015-11-28 DIAGNOSIS — I10 Essential (primary) hypertension: Secondary | ICD-10-CM | POA: Diagnosis present

## 2015-11-28 DIAGNOSIS — Z79899 Other long term (current) drug therapy: Secondary | ICD-10-CM | POA: Insufficient documentation

## 2015-11-28 NOTE — Telephone Encounter (Signed)
Date of birth verified by pt  Mammogram results given  Pt aware of diagnostic mammogram and [place]

## 2015-11-28 NOTE — Patient Instructions (Signed)
Thank you for coming to see Wendy Petty today!  Your blood pressure was good today so keep up the great work.  Continue to take your medications as prescribed and continue your diet and exercise efforts. The three of these together (meds, diet, exercise) are the best way to keep your blood pressure and blood sugar down!  Follow up with Dr. Adrian Blackwater

## 2015-11-28 NOTE — Telephone Encounter (Signed)
-----   Message from Boykin Nearing, MD sent at 11/23/2015  8:53 AM EDT ----- Possible mass in R breast on screening mammogram diagnostic mammogram and R breast US ordered please schedule

## 2015-11-28 NOTE — Progress Notes (Signed)
S:    Patient arrives in good spirits.    Presents to the clinic for hypertension evaluation. Patient was referred on 10/30/2015 by Dr. Adrian Blackwater.  Patient was last seen by Primary Care Provider on 10/30/15.   Patient reports adherence with medications.  Current BP Medications include:  Amlodipine 5 mg daily, carvedilol 12.5 mg daily (prescribed as twice daily)  Antihypertensives tried in the past include: losartan-HCTZ  Patient has multiple concerns today including the buzzing in her ear, her diabetes, and eye pain. She thinks that the eye pain is related to the ear issues. She also has continued pain in her legs.  O:   Last 3 Office BP readings: BP Readings from Last 3 Encounters:  11/28/15 145/78  10/30/15 138/80  09/29/15 129/81    BMET    Component Value Date/Time   NA 141 10/30/2015 1716   K 4.9 10/30/2015 1716   CL 101 10/30/2015 1716   CO2 26 10/30/2015 1716   GLUCOSE 102* 10/30/2015 1716   BUN 18 10/30/2015 1716   CREATININE 1.37* 10/30/2015 1716   CREATININE 1.2 05/19/2014 1127   CALCIUM 9.1 10/30/2015 1716   GFRNONAA 38* 10/30/2015 1716   GFRNONAA 40* 06/29/2013 0536   GFRAA 44* 10/30/2015 1716   GFRAA 46* 06/29/2013 0536    A/P: Hypertension longstanding currently uncontrolled (if targeting <140/90) on current medications but this is likely due to stress from ear buzzing and anxiety concerning awaiting ENT referral.  Continued amlodipine 5 mg daily and instructed to take carvedilol 12.5 mg BID.  If elevated at next visit, would consider ACEi for kidney protection in diabetes. Could also consider increasing the dose of amlodipine back to 10 mg if another cause is determined for her swelling.   Results reviewed and written information provided.   Total time in face-to-face counseling 20 minutes.   F/U Clinic Visit with Dr. Adrian Blackwater as directed.  Patient seen with Jamie Brookes, PharmD Candidate

## 2015-12-01 ENCOUNTER — Encounter: Payer: Self-pay | Admitting: Cardiovascular Disease

## 2015-12-01 ENCOUNTER — Ambulatory Visit (INDEPENDENT_AMBULATORY_CARE_PROVIDER_SITE_OTHER): Payer: No Typology Code available for payment source | Admitting: Cardiovascular Disease

## 2015-12-01 ENCOUNTER — Ambulatory Visit
Admission: RE | Admit: 2015-12-01 | Discharge: 2015-12-01 | Disposition: A | Discharge status: Home / Self Care | Payer: No Typology Code available for payment source | Source: Ambulatory Visit | Attending: Family Medicine | Admitting: Family Medicine

## 2015-12-01 VITALS — BP 150/80 | HR 114 | Ht 62.0 in | Wt 151.8 lb

## 2015-12-01 DIAGNOSIS — N6001 Solitary cyst of right breast: Secondary | ICD-10-CM | POA: Diagnosis not present

## 2015-12-01 DIAGNOSIS — I10 Essential (primary) hypertension: Secondary | ICD-10-CM | POA: Diagnosis not present

## 2015-12-01 DIAGNOSIS — R0789 Other chest pain: Secondary | ICD-10-CM | POA: Diagnosis not present

## 2015-12-01 DIAGNOSIS — N6310 Unspecified lump in the right breast, unspecified quadrant: Secondary | ICD-10-CM

## 2015-12-01 DIAGNOSIS — R000 Tachycardia, unspecified: Secondary | ICD-10-CM | POA: Diagnosis not present

## 2015-12-01 DIAGNOSIS — R928 Other abnormal and inconclusive findings on diagnostic imaging of breast: Secondary | ICD-10-CM | POA: Diagnosis not present

## 2015-12-01 LAB — BASIC METABOLIC PANEL
BUN: 16 mg/dL (ref 7–25)
CHLORIDE: 99 mmol/L (ref 98–110)
CO2: 25 mmol/L (ref 20–31)
Calcium: 8.9 mg/dL (ref 8.6–10.4)
Creat: 1.18 mg/dL — ABNORMAL HIGH (ref 0.60–0.93)
GLUCOSE: 124 mg/dL — AB (ref 65–99)
POTASSIUM: 3.6 mmol/L (ref 3.5–5.3)
Sodium: 136 mmol/L (ref 135–146)

## 2015-12-01 LAB — TSH: TSH: 0.55 mIU/L

## 2015-12-01 LAB — HEMATOCRIT: HEMATOCRIT: 38.4 % (ref 36.0–46.0)

## 2015-12-01 LAB — HEMOGLOBIN: Hemoglobin: 13.2 g/dL (ref 12.0–15.0)

## 2015-12-01 MED ORDER — CARVEDILOL 25 MG PO TABS: 25.0000 mg | ORAL_TABLET | Freq: Two times a day (BID) | ORAL | Status: DC

## 2015-12-01 NOTE — Patient Instructions (Addendum)
Medication Instructions:  Your physician has recommended you make the following change in your medication:  1-Coreg 25 mg by mouth twice daily.  Labwork: Your physician recommends that you return for lab work in:  TSH, BMET, and Hgb/Hb.   Testing/Procedures: Your physician has requested that you have an echocardiogram. Echocardiography is a painless test that uses sound waves to create images of your heart. It provides your doctor with information about the size and shape of your heart and how well your heart's chambers and valves are working. This procedure takes approximately one hour. There are no restrictions for this procedure.  Your physician has requested that you have a lexiscan myoview. For further information please visit [email or web address]. Please follow instruction sheet, as given.  Follow-Up: Your physician wants you to follow-up in: 6 months with [name]. You will receive a reminder letter in the mail two months in advance. If you don't receive a letter, please call our office to schedule the follow-up appointment.  If you need a refill on your cardiac medications before your next appointment, please call your pharmacy.

## 2015-12-03 DIAGNOSIS — I10 Essential (primary) hypertension: Secondary | ICD-10-CM | POA: Diagnosis not present

## 2015-12-03 DIAGNOSIS — J40 Bronchitis, not specified as acute or chronic: Secondary | ICD-10-CM | POA: Diagnosis not present

## 2015-12-03 DIAGNOSIS — R079 Chest pain, unspecified: Secondary | ICD-10-CM | POA: Diagnosis not present

## 2015-12-03 DIAGNOSIS — Z791 Long term (current) use of non-steroidal anti-inflammatories (NSAID): Secondary | ICD-10-CM | POA: Diagnosis not present

## 2015-12-03 DIAGNOSIS — Z7982 Long term (current) use of aspirin: Secondary | ICD-10-CM | POA: Diagnosis not present

## 2015-12-03 DIAGNOSIS — R0789 Other chest pain: Secondary | ICD-10-CM | POA: Diagnosis not present

## 2015-12-03 DIAGNOSIS — Z79899 Other long term (current) drug therapy: Secondary | ICD-10-CM | POA: Diagnosis not present

## 2015-12-03 DIAGNOSIS — R509 Fever, unspecified: Secondary | ICD-10-CM | POA: Diagnosis not present

## 2015-12-03 DIAGNOSIS — R05 Cough: Secondary | ICD-10-CM | POA: Diagnosis not present

## 2015-12-03 DIAGNOSIS — E079 Disorder of thyroid, unspecified: Secondary | ICD-10-CM | POA: Diagnosis not present

## 2015-12-03 DIAGNOSIS — Z7902 Long term (current) use of antithrombotics/antiplatelets: Secondary | ICD-10-CM | POA: Diagnosis not present

## 2015-12-04 ENCOUNTER — Telehealth (HOSPITAL_COMMUNITY): Payer: Self-pay | Admitting: Radiology

## 2015-12-04 ENCOUNTER — Telehealth (HOSPITAL_COMMUNITY): Payer: Self-pay | Admitting: *Deleted

## 2015-12-04 NOTE — Telephone Encounter (Signed)
Patient given detailed instructions per Myocardial Perfusion Study Information Sheet for the test on 12/06/2015 at 7:15. Patient notified to arrive 15 minutes early and that it is imperative to arrive on time for appointment to keep from having the test rescheduled.  If you need to cancel or reschedule your appointment, please call the office within 24 hours of your appointment. Failure to do so may result in a cancellation of your appointment, and a $50 no show fee. Patient verbalized understanding.EHK Spoke to [employer] with patient's verbal permission.

## 2015-12-04 NOTE — Telephone Encounter (Signed)
Interpreter line used to call patient. No answer on patients home # and mail box was full. Spanish interpreter called cell # and grand daughter answered. Patient does not have a DPR on file. Left message with gr.daughter to have patient to call us back.Kennedee Kitzmiller, Ranae Palms

## 2015-12-05 DIAGNOSIS — H9202 Otalgia, left ear: Secondary | ICD-10-CM | POA: Diagnosis not present

## 2015-12-05 DIAGNOSIS — H9042 Sensorineural hearing loss, unilateral, left ear, with unrestricted hearing on the contralateral side: Secondary | ICD-10-CM | POA: Diagnosis not present

## 2015-12-05 DIAGNOSIS — H903 Sensorineural hearing loss, bilateral: Secondary | ICD-10-CM | POA: Diagnosis not present

## 2015-12-05 DIAGNOSIS — H918X1 Other specified hearing loss, right ear: Secondary | ICD-10-CM | POA: Diagnosis not present

## 2015-12-06 ENCOUNTER — Ambulatory Visit (HOSPITAL_COMMUNITY): Payer: Medicare Other | Attending: Cardiovascular Disease

## 2015-12-06 ENCOUNTER — Ambulatory Visit (HOSPITAL_BASED_OUTPATIENT_CLINIC_OR_DEPARTMENT_OTHER): Payer: Medicare Other

## 2015-12-06 ENCOUNTER — Other Ambulatory Visit: Payer: Self-pay

## 2015-12-06 DIAGNOSIS — I358 Other nonrheumatic aortic valve disorders: Secondary | ICD-10-CM | POA: Insufficient documentation

## 2015-12-06 DIAGNOSIS — R079 Chest pain, unspecified: Secondary | ICD-10-CM | POA: Diagnosis present

## 2015-12-06 DIAGNOSIS — E119 Type 2 diabetes mellitus without complications: Secondary | ICD-10-CM | POA: Diagnosis not present

## 2015-12-06 DIAGNOSIS — I10 Essential (primary) hypertension: Secondary | ICD-10-CM | POA: Insufficient documentation

## 2015-12-06 DIAGNOSIS — R0609 Other forms of dyspnea: Secondary | ICD-10-CM | POA: Diagnosis not present

## 2015-12-06 DIAGNOSIS — E785 Hyperlipidemia, unspecified: Secondary | ICD-10-CM | POA: Insufficient documentation

## 2015-12-06 DIAGNOSIS — R0789 Other chest pain: Secondary | ICD-10-CM | POA: Diagnosis not present

## 2015-12-06 LAB — MYOCARDIAL PERFUSION IMAGING
CHL CUP NUCLEAR SRS: 4
CHL CUP RESTING HR STRESS: 62 {beats}/min
LV dias vol: 93 mL (ref 46–106)
LVSYSVOL: 49 mL
NUC STRESS TID: 1.22
Peak HR: 92 {beats}/min
RATE: 0.3
SDS: 0
SSS: 4

## 2015-12-06 LAB — ECHOCARDIOGRAM COMPLETE
HEIGHTINCHES: 62 in
Weight: 2416 oz

## 2015-12-06 MED ORDER — AMINOPHYLLINE 25 MG/ML IV SOLN
75.0000 mg | Freq: Once | INTRAVENOUS | Status: AC
  Administered 2015-12-06: 75 mg via INTRAVENOUS

## 2015-12-06 MED ORDER — TECHNETIUM TC 99M SESTAMIBI GENERIC - CARDIOLITE
10.2000 | Freq: Once | INTRAVENOUS | Status: AC | PRN
  Administered 2015-12-06: 10 via INTRAVENOUS

## 2015-12-06 MED ORDER — REGADENOSON 0.4 MG/5ML IV SOLN
0.4000 mg | Freq: Once | INTRAVENOUS | Status: AC
  Administered 2015-12-06: 0.4 mg via INTRAVENOUS

## 2015-12-06 MED ORDER — TECHNETIUM TC 99M SESTAMIBI GENERIC - CARDIOLITE
32.2000 | Freq: Once | INTRAVENOUS | Status: AC | PRN
  Administered 2015-12-06: 32.2 via INTRAVENOUS

## 2015-12-08 ENCOUNTER — Other Ambulatory Visit: Payer: Self-pay | Admitting: Otolaryngology

## 2015-12-08 DIAGNOSIS — H9192 Unspecified hearing loss, left ear: Secondary | ICD-10-CM

## 2015-12-11 ENCOUNTER — Other Ambulatory Visit (HOSPITAL_COMMUNITY): Payer: No Typology Code available for payment source

## 2016-05-08 ENCOUNTER — Telehealth: Payer: Self-pay | Admitting: Family Medicine

## 2016-05-08 DIAGNOSIS — I10 Essential (primary) hypertension: Secondary | ICD-10-CM

## 2016-05-08 DIAGNOSIS — M25562 Pain in left knee: Secondary | ICD-10-CM

## 2016-05-08 DIAGNOSIS — E785 Hyperlipidemia, unspecified: Secondary | ICD-10-CM

## 2016-05-08 DIAGNOSIS — R0789 Other chest pain: Secondary | ICD-10-CM

## 2016-05-08 DIAGNOSIS — I2510 Atherosclerotic heart disease of native coronary artery without angina pectoris: Secondary | ICD-10-CM

## 2016-05-08 DIAGNOSIS — R413 Other amnesia: Secondary | ICD-10-CM

## 2016-05-08 DIAGNOSIS — G8929 Other chronic pain: Secondary | ICD-10-CM

## 2016-05-08 DIAGNOSIS — E038 Other specified hypothyroidism: Secondary | ICD-10-CM

## 2016-05-08 MED ORDER — GABAPENTIN 300 MG PO CAPS: 300.0000 mg | ORAL_CAPSULE | Freq: Every day | ORAL | 0 refills | Status: DC

## 2016-05-08 MED ORDER — DONEPEZIL HCL 5 MG PO TABS: 10.0000 mg | ORAL_TABLET | Freq: Every day | ORAL | 0 refills | Status: DC

## 2016-05-08 MED ORDER — ATORVASTATIN CALCIUM 40 MG PO TABS: 40.0000 mg | ORAL_TABLET | Freq: Every day | ORAL | 0 refills | Status: DC

## 2016-05-08 MED ORDER — FLUTICASONE PROPIONATE 50 MCG/ACT NA SUSP: 2.0000 | Freq: Every day | NASAL | 0 refills | Status: DC

## 2016-05-08 MED ORDER — CARVEDILOL 25 MG PO TABS: 25.0000 mg | ORAL_TABLET | Freq: Two times a day (BID) | ORAL | 0 refills | Status: DC

## 2016-05-08 MED ORDER — MEMANTINE HCL 10 MG PO TABS: 10.0000 mg | ORAL_TABLET | Freq: Every day | ORAL | 0 refills | Status: DC

## 2016-05-08 MED ORDER — AMLODIPINE BESYLATE 5 MG PO TABS: 5.0000 mg | ORAL_TABLET | Freq: Every day | ORAL | 0 refills | Status: DC

## 2016-05-08 MED ORDER — CLOPIDOGREL BISULFATE 75 MG PO TABS: 75.0000 mg | ORAL_TABLET | Freq: Every day | ORAL | 0 refills | Status: DC

## 2016-05-08 MED ORDER — LEVOTHYROXINE SODIUM 25 MCG PO TABS: ORAL_TABLET | ORAL | 0 refills | Status: DC

## 2016-05-08 NOTE — Telephone Encounter (Signed)
Pt called requesting medication refill on all current medications, pt states she is back from her country and is completely out, Please f/up

## 2016-05-08 NOTE — Telephone Encounter (Signed)
Chronic medications refilled x 30 days- patient needs office visit for further refills.

## 2016-05-10 ENCOUNTER — Encounter (HOSPITAL_COMMUNITY): Payer: Self-pay

## 2016-05-14 ENCOUNTER — Ambulatory Visit (HOSPITAL_COMMUNITY)
Admission: RE | Admit: 2016-05-14 | Discharge: 2016-05-14 | Disposition: A | Discharge status: Home / Self Care | Payer: Medicare Other | Source: Ambulatory Visit | Attending: Otolaryngology | Admitting: Otolaryngology

## 2016-05-14 ENCOUNTER — Encounter (HOSPITAL_COMMUNITY): Payer: Self-pay

## 2016-05-15 ENCOUNTER — Other Ambulatory Visit: Payer: Self-pay | Admitting: Otolaryngology

## 2016-05-21 ENCOUNTER — Ambulatory Visit: Payer: Medicare Other | Attending: Family Medicine | Admitting: Family Medicine

## 2016-05-21 ENCOUNTER — Encounter: Payer: Self-pay | Admitting: Family Medicine

## 2016-05-21 VITALS — BP 205/93 | HR 70 | Temp 97.9°F | Wt 142.0 lb

## 2016-05-21 DIAGNOSIS — Z23 Encounter for immunization: Secondary | ICD-10-CM

## 2016-05-21 DIAGNOSIS — E785 Hyperlipidemia, unspecified: Secondary | ICD-10-CM | POA: Diagnosis not present

## 2016-05-21 DIAGNOSIS — R0789 Other chest pain: Secondary | ICD-10-CM | POA: Insufficient documentation

## 2016-05-21 DIAGNOSIS — Z7951 Long term (current) use of inhaled steroids: Secondary | ICD-10-CM | POA: Insufficient documentation

## 2016-05-21 DIAGNOSIS — Z7982 Long term (current) use of aspirin: Secondary | ICD-10-CM | POA: Diagnosis not present

## 2016-05-21 DIAGNOSIS — M170 Bilateral primary osteoarthritis of knee: Secondary | ICD-10-CM | POA: Insufficient documentation

## 2016-05-21 DIAGNOSIS — I2510 Atherosclerotic heart disease of native coronary artery without angina pectoris: Secondary | ICD-10-CM | POA: Insufficient documentation

## 2016-05-21 DIAGNOSIS — Z7902 Long term (current) use of antithrombotics/antiplatelets: Secondary | ICD-10-CM | POA: Insufficient documentation

## 2016-05-21 DIAGNOSIS — Z79899 Other long term (current) drug therapy: Secondary | ICD-10-CM | POA: Diagnosis not present

## 2016-05-21 DIAGNOSIS — G8929 Other chronic pain: Secondary | ICD-10-CM

## 2016-05-21 DIAGNOSIS — M549 Dorsalgia, unspecified: Secondary | ICD-10-CM | POA: Diagnosis not present

## 2016-05-21 DIAGNOSIS — I10 Essential (primary) hypertension: Secondary | ICD-10-CM | POA: Diagnosis not present

## 2016-05-21 DIAGNOSIS — E038 Other specified hypothyroidism: Secondary | ICD-10-CM | POA: Diagnosis not present

## 2016-05-21 DIAGNOSIS — K219 Gastro-esophageal reflux disease without esophagitis: Secondary | ICD-10-CM | POA: Diagnosis not present

## 2016-05-21 DIAGNOSIS — E119 Type 2 diabetes mellitus without complications: Secondary | ICD-10-CM | POA: Diagnosis not present

## 2016-05-21 DIAGNOSIS — R413 Other amnesia: Secondary | ICD-10-CM

## 2016-05-21 DIAGNOSIS — E039 Hypothyroidism, unspecified: Secondary | ICD-10-CM | POA: Diagnosis not present

## 2016-05-21 DIAGNOSIS — M545 Low back pain: Secondary | ICD-10-CM

## 2016-05-21 LAB — COMPLETE METABOLIC PANEL WITH GFR
ALK PHOS: 53 U/L (ref 33–130)
ALT: 18 U/L (ref 6–29)
AST: 16 U/L (ref 10–35)
Albumin: 3.7 g/dL (ref 3.6–5.1)
BILIRUBIN TOTAL: 0.4 mg/dL (ref 0.2–1.2)
BUN: 16 mg/dL (ref 7–25)
CALCIUM: 9.2 mg/dL (ref 8.6–10.4)
CO2: 28 mmol/L (ref 20–31)
CREATININE: 0.87 mg/dL (ref 0.60–0.93)
Chloride: 104 mmol/L (ref 98–110)
GFR, EST AFRICAN AMERICAN: 76 mL/min (ref >=60)
GFR, EST NON AFRICAN AMERICAN: 66 mL/min (ref >=60)
Glucose, Bld: 114 mg/dL — ABNORMAL HIGH (ref 65–99)
Potassium: 4 mmol/L (ref 3.5–5.3)
Sodium: 141 mmol/L (ref 135–146)
TOTAL PROTEIN: 6.5 g/dL (ref 6.1–8.1)

## 2016-05-21 LAB — LIPID PANEL
CHOL/HDL RATIO: 3.7 ratio (ref <=5.0)
Cholesterol: 195 mg/dL (ref 125–200)
HDL: 53 mg/dL (ref >=46)
LDL CALC: 99 mg/dL (ref <130)
TRIGLYCERIDES: 214 mg/dL — AB (ref <150)
VLDL: 43 mg/dL — AB (ref <30)

## 2016-05-21 LAB — TSH: TSH: 1.98 mIU/L

## 2016-05-21 LAB — POCT GLYCOSYLATED HEMOGLOBIN (HGB A1C): Hemoglobin A1C: 6.5

## 2016-05-21 LAB — GLUCOSE, POCT (MANUAL RESULT ENTRY): POC Glucose: 126 mg/dl — AB (ref 70–99)

## 2016-05-21 MED ORDER — CLOPIDOGREL BISULFATE 75 MG PO TABS: 75.0000 mg | ORAL_TABLET | Freq: Every day | ORAL | 3 refills | Status: DC

## 2016-05-21 MED ORDER — ASPIRIN 81 MG PO TABS: 81.0000 mg | ORAL_TABLET | Freq: Every day | ORAL | 3 refills | Status: DC

## 2016-05-21 MED ORDER — MEMANTINE HCL 10 MG PO TABS: 10.0000 mg | ORAL_TABLET | Freq: Every day | ORAL | 3 refills | Status: DC

## 2016-05-21 MED ORDER — DONEPEZIL HCL 5 MG PO TABS: 10.0000 mg | ORAL_TABLET | Freq: Every day | ORAL | 0 refills | Status: DC

## 2016-05-21 MED ORDER — LEVOTHYROXINE SODIUM 25 MCG PO TABS: ORAL_TABLET | ORAL | 3 refills | Status: DC

## 2016-05-21 MED ORDER — CARVEDILOL 25 MG PO TABS: 25.0000 mg | ORAL_TABLET | Freq: Two times a day (BID) | ORAL | 3 refills | Status: DC

## 2016-05-21 MED ORDER — B COMPLEX VITAMINS PO CAPS: 1.0000 | ORAL_CAPSULE | Freq: Every day | ORAL | 3 refills | Status: DC

## 2016-05-21 MED ORDER — ATORVASTATIN CALCIUM 40 MG PO TABS: 40.0000 mg | ORAL_TABLET | Freq: Every day | ORAL | 3 refills | Status: DC

## 2016-05-21 MED ORDER — RANITIDINE HCL 300 MG PO TABS: 300.0000 mg | ORAL_TABLET | Freq: Every day | ORAL | 3 refills | Status: DC

## 2016-05-21 MED ORDER — PREGABALIN 25 MG PO CAPS: 25.0000 mg | ORAL_CAPSULE | Freq: Three times a day (TID) | ORAL | 5 refills | Status: DC

## 2016-05-21 MED ORDER — AMLODIPINE BESYLATE 5 MG PO TABS: 5.0000 mg | ORAL_TABLET | Freq: Every day | ORAL | 3 refills | Status: DC

## 2016-05-21 MED ORDER — CLONIDINE HCL 0.1 MG PO TABS
0.2000 mg | ORAL_TABLET | Freq: Once | ORAL | Status: AC
  Administered 2016-05-21: 0.2 mg via ORAL

## 2016-05-21 NOTE — Assessment & Plan Note (Signed)
A: uncontrolled due to med non compliance P: Refilled coreg, clarified that is it dose twice daily, not once Refilled norvasc

## 2016-05-21 NOTE — Progress Notes (Signed)
Subjective:  Patient ID: Wendy Petty, female    DOB: 1940-11-14  Age: 75 y.o. MRN: [id number]  CC: Diabetes and Hypertension   HPI Ariyan Orlin Hilding presents her son, she has recently returned from a 3 month visit back to [place].   1. CHRONIC HYPERTENSION  Disease Monitoring  Blood pressure range: not checking   Chest pain: no   Dyspnea: no   Claudication: no   Medication compliance: yes, coreg in morning only . Out of amlodipine for 15 days  Medication Side Effects  Lightheadedness: no   Urinary frequency: no   Edema: no     2. DM2: diet controlled. No polyuria or polydipsia.   3. Back pain: L side radiates to L foot. Sharp pains. She stated lyrica 25 mg TID while in [place] which helped with her pain. She request refill.   4. Hypothyroidism: taking synthroid. No weight changes. No swelling. No mood changes.  5. GERD: takes prilosec. Request refill. No stomach pain.   Social History  Substance Use Topics  . Smoking status: Never Smoker  . Smokeless tobacco: Never Used  . Alcohol use No     Outpatient Medications Prior to Visit  Medication Sig Dispense Refill  . acetaminophen (TYLENOL 8 HOUR) 650 MG CR tablet Take 1 tablet (650 mg total) by mouth every 8 (eight) hours as needed for pain. 90 tablet 5  . amLODipine (NORVASC) 5 MG tablet Take 1 tablet (5 mg total) by mouth daily. 30 tablet 0  . aspirin 81 MG tablet Take 1 tablet (81 mg total) by mouth daily. 90 tablet 3  . atorvastatin (LIPITOR) 40 MG tablet Take 1 tablet (40 mg total) by mouth daily. 30 tablet 0  . carvedilol (COREG) 25 MG tablet Take 1 tablet (25 mg total) by mouth 2 (two) times daily with a meal. 60 tablet 0  . clopidogrel (PLAVIX) 75 MG tablet Take 1 tablet (75 mg total) by mouth daily. 30 tablet 0  . docusate sodium (COLACE) 100 MG capsule Take 1 capsule (100 mg total) by mouth daily. To prevent constipation while taking narcotic pain reliever like Vicodin (hydrocodone). 30 capsule  0  . donepezil (ARICEPT) 5 MG tablet Take 2 tablets (10 mg total) by mouth daily. 60 tablet 0  . fluticasone (FLONASE) 50 MCG/ACT nasal spray Place 2 sprays into both nostrils daily. 16 g 0  . gabapentin (NEURONTIN) 300 MG capsule Take 1 capsule (300 mg total) by mouth at bedtime. 30 capsule 0  . hydrocortisone cream 1 % Apply 1 application topically 2 (two) times daily. 60 g 5  . levothyroxine (SYNTHROID) 25 MCG tablet TAKE 1 TABLET BY MOUTH ONCE DAILY 30 tablet 0  . loratadine (CLARITIN) 10 MG tablet Take 1 tablet (10 mg total) by mouth daily. 30 tablet 3  . meloxicam (MOBIC) 15 MG tablet Take 1 tablet (15 mg total) by mouth daily. 30 tablet 2  . memantine (NAMENDA) 10 MG tablet Take 1 tablet (10 mg total) by mouth daily. 30 tablet 0  . nitroGLYCERIN (NITROSTAT) 0.4 MG SL tablet Place 1 tablet (0.4 mg total) under the tongue every 5 (five) minutes as needed for chest pain. 15 tablet 2  . omeprazole (PRILOSEC) 20 MG capsule Take 1 capsule (20 mg total) by mouth daily. 90 capsule 3   No facility-administered medications prior to visit.     ROS Review of Systems  Constitutional: Negative for chills and fever.  Eyes: Negative for visual disturbance.  Respiratory: Negative for  shortness of breath.   Cardiovascular: Negative for chest pain.  Gastrointestinal: Negative for abdominal pain and blood in stool.  Endocrine: Negative for polyphagia and polyuria.  Musculoskeletal: Negative for arthralgias and back pain.  Skin: Negative for rash.  Allergic/Immunologic: Negative for immunocompromised state.  Hematological: Negative for adenopathy. Does not bruise/bleed easily.  Psychiatric/Behavioral: Negative for dysphoric mood and suicidal ideas.    Objective:  BP (!) 205/93   Pulse 70   Temp 97.9 F (36.6 C) (Oral)   Wt 142 lb (64.4 kg)   SpO2 98%   BMI 25.97 kg/m   BP/Weight 05/21/2016 12/01/2015 Q000111Q  Systolic BP 99991111 Q000111Q Q000111Q  Diastolic BP 93 80 78  Wt. (Lbs) 142 151.8 -  BMI  25.97 27.76 -     Physical Exam  Constitutional: She is oriented to person, place, and time. She appears well-developed and well-nourished. No distress.  HENT:  Head: Normocephalic and atraumatic.  Neck: No thyromegaly present.  Cardiovascular: Normal rate, regular rhythm, normal heart sounds and intact distal pulses.   Pulmonary/Chest: Effort normal and breath sounds normal.  Musculoskeletal: She exhibits no edema.  Neurological: She is alert and oriented to person, place, and time.  Skin: Skin is warm and dry. No rash noted.  Psychiatric: She has a normal mood and affect.    Lab Results  Component Value Date   HGBA1C 7.20 09/29/2015   Lab Results  Component Value Date   HGBA1C 6.5 05/21/2016    CBG 126  Treated high BP with clonidine 0.2 mg PO x one   Assessment & Plan:   [place] was seen today for diabetes and hypertension.  Diagnoses and all orders for this visit:  Essential hypertension -     cloNIDine (CATAPRES) tablet 0.2 mg; Take 2 tablets (0.2 mg total) by mouth once. -     carvedilol (COREG) 25 MG tablet; Take 1 tablet (25 mg total) by mouth 2 (two) times daily with a meal. -     amLODipine (NORVASC) 5 MG tablet; Take 1 tablet (5 mg total) by mouth daily. -     COMPLETE METABOLIC PANEL WITH GFR  Controlled type 2 diabetes mellitus without complication, without long-term current use of insulin (HCC) -     POCT glucose (manual entry) -     POCT glycosylated hemoglobin (Hb A1C)  Other chest pain -     carvedilol (COREG) 25 MG tablet; Take 1 tablet (25 mg total) by mouth 2 (two) times daily with a meal.  Gastroesophageal reflux disease, esophagitis presence not specified -     ranitidine (ZANTAC) 300 MG tablet; Take 1 tablet (300 mg total) by mouth at bedtime.  Other specified hypothyroidism -     levothyroxine (SYNTHROID) 25 MCG tablet; TAKE 1 TABLET BY MOUTH ONCE DAILY -     TSH  Atherosclerosis of native coronary artery of native heart without angina  pectoris -     clopidogrel (PLAVIX) 75 MG tablet; Take 1 tablet (75 mg total) by mouth daily. -     aspirin 81 MG tablet; Take 1 tablet (81 mg total) by mouth daily. -     Lipid Panel  Hyperlipidemia -     atorvastatin (LIPITOR) 40 MG tablet; Take 1 tablet (40 mg total) by mouth daily. -     Lipid Panel  Memory deficit -     donepezil (ARICEPT) 5 MG tablet; Take 2 tablets (10 mg total) by mouth daily. -     memantine (NAMENDA) 10 MG  tablet; Take 1 tablet (10 mg total) by mouth daily. -     b complex vitamins capsule; Take 1 capsule by mouth daily.  Primary osteoarthritis of both knees -     pregabalin (LYRICA) 25 MG capsule; Take 1 capsule (25 mg total) by mouth 3 (three) times daily.  Needs flu shot -     Flu Vaccine QUAD 36+ mos IM   Meds ordered this encounter  Medications  . cloNIDine (CATAPRES) tablet 0.2 mg  . carvedilol (COREG) 25 MG tablet    Sig: Take 1 tablet (25 mg total) by mouth 2 (two) times daily with a meal.    Dispense:  180 tablet    Refill:  3  . amLODipine (NORVASC) 5 MG tablet    Sig: Take 1 tablet (5 mg total) by mouth daily.    Dispense:  90 tablet    Refill:  3  . levothyroxine (SYNTHROID) 25 MCG tablet    Sig: TAKE 1 TABLET BY MOUTH ONCE DAILY    Dispense:  90 tablet    Refill:  3  . pregabalin (LYRICA) 25 MG capsule    Sig: Take 1 capsule (25 mg total) by mouth 3 (three) times daily.    Dispense:  90 capsule    Refill:  5  . clopidogrel (PLAVIX) 75 MG tablet    Sig: Take 1 tablet (75 mg total) by mouth daily.    Dispense:  90 tablet    Refill:  3  . atorvastatin (LIPITOR) 40 MG tablet    Sig: Take 1 tablet (40 mg total) by mouth daily.    Dispense:  90 tablet    Refill:  3  . donepezil (ARICEPT) 5 MG tablet    Sig: Take 2 tablets (10 mg total) by mouth daily.    Dispense:  60 tablet    Refill:  0  . memantine (NAMENDA) 10 MG tablet    Sig: Take 1 tablet (10 mg total) by mouth daily.    Dispense:  90 tablet    Refill:  3  . aspirin 81  MG tablet    Sig: Take 1 tablet (81 mg total) by mouth daily.    Dispense:  90 tablet    Refill:  3  . b complex vitamins capsule    Sig: Take 1 capsule by mouth daily.    Dispense:  90 capsule    Refill:  3  . ranitidine (ZANTAC) 300 MG tablet    Sig: Take 1 tablet (300 mg total) by mouth at bedtime.    Dispense:  90 tablet    Refill:  3    Follow-up: Return in about 2 weeks (around 06/04/2016) for pharmacy HTN f/u .   Boykin Nearing MD

## 2016-05-21 NOTE — Assessment & Plan Note (Signed)
chronic low back pain with radiculopathy down L leg Continue lyrica 25 mg TID

## 2016-05-21 NOTE — Progress Notes (Signed)
Left leg pain Pt medication is in spanish. Pt states that she does not have diabetes. Pt has not been taking BP medication.

## 2016-05-21 NOTE — Assessment & Plan Note (Signed)
Remains diet controlled.

## 2016-05-21 NOTE — Patient Instructions (Addendum)
Hilaria was seen today for diabetes and hypertension.  Diagnoses and all orders for this visit:  Essential hypertension -     cloNIDine (CATAPRES) tablet 0.2 mg; Take 2 tablets (0.2 mg total) by mouth once. -     carvedilol (COREG) 25 MG tablet; Take 1 tablet (25 mg total) by mouth 2 (two) times daily with a meal. -     amLODipine (NORVASC) 5 MG tablet; Take 1 tablet (5 mg total) by mouth daily.  Controlled type 2 diabetes mellitus without complication, without long-term current use of insulin (HCC) -     POCT glucose (manual entry) -     POCT glycosylated hemoglobin (Hb A1C)  Other chest pain -     carvedilol (COREG) 25 MG tablet; Take 1 tablet (25 mg total) by mouth 2 (two) times daily with a meal.  Gastroesophageal reflux disease, esophagitis presence not specified -     ranitidine (ZANTAC) 300 MG tablet; Take 1 tablet (300 mg total) by mouth at bedtime.  Other specified hypothyroidism -     levothyroxine (SYNTHROID) 25 MCG tablet; TAKE 1 TABLET BY MOUTH ONCE DAILY  Atherosclerosis of native coronary artery of native heart without angina pectoris -     clopidogrel (PLAVIX) 75 MG tablet; Take 1 tablet (75 mg total) by mouth daily. -     aspirin 81 MG tablet; Take 1 tablet (81 mg total) by mouth daily.  Hyperlipidemia -     atorvastatin (LIPITOR) 40 MG tablet; Take 1 tablet (40 mg total) by mouth daily.  Memory deficit -     donepezil (ARICEPT) 5 MG tablet; Take 2 tablets (10 mg total) by mouth daily. -     memantine (NAMENDA) 10 MG tablet; Take 1 tablet (10 mg total) by mouth daily.  Primary osteoarthritis of both knees -     pregabalin (LYRICA) 25 MG capsule; Take 1 capsule (25 mg total) by mouth 3 (three) times daily.  Other orders -     b complex vitamins capsule; Take 1 capsule by mouth daily.   F/u in 2 weeks for BP check with pharmacist  F/u with me in 3 months for HTN   Dr. Adrian Blackwater

## 2016-05-21 NOTE — Assessment & Plan Note (Signed)
PPI contraindicated in elderly Changed to zantac

## 2016-05-21 NOTE — Assessment & Plan Note (Signed)
Check TSH Refilled synthroid

## 2016-05-24 ENCOUNTER — Ambulatory Visit
Admission: RE | Admit: 2016-05-24 | Discharge: 2016-05-24 | Disposition: A | Discharge status: Home / Self Care | Payer: Medicare Other | Source: Ambulatory Visit | Attending: Otolaryngology | Admitting: Otolaryngology

## 2016-05-24 DIAGNOSIS — H538 Other visual disturbances: Secondary | ICD-10-CM | POA: Diagnosis not present

## 2016-05-24 DIAGNOSIS — H9193 Unspecified hearing loss, bilateral: Secondary | ICD-10-CM | POA: Diagnosis not present

## 2016-05-24 DIAGNOSIS — H9192 Unspecified hearing loss, left ear: Secondary | ICD-10-CM

## 2016-05-24 MED ORDER — GADOBENATE DIMEGLUMINE 529 MG/ML IV SOLN
13.0000 mL | Freq: Once | INTRAVENOUS | Status: AC | PRN
  Administered 2016-05-24: 13 mL via INTRAVENOUS

## 2016-05-29 ENCOUNTER — Telehealth: Payer: Self-pay

## 2016-05-29 NOTE — Telephone Encounter (Signed)
Pt was called on 9/20 and VM was left for her to return the phone call.

## 2016-06-04 ENCOUNTER — Ambulatory Visit: Payer: Medicare Other | Attending: Family Medicine | Admitting: Pharmacist

## 2016-06-04 VITALS — BP 158/88 | HR 72

## 2016-06-04 DIAGNOSIS — I10 Essential (primary) hypertension: Secondary | ICD-10-CM | POA: Diagnosis not present

## 2016-06-04 DIAGNOSIS — I2510 Atherosclerotic heart disease of native coronary artery without angina pectoris: Secondary | ICD-10-CM

## 2016-06-04 MED ORDER — NITROGLYCERIN 0.4 MG SL SUBL: 0.4000 mg | SUBLINGUAL_TABLET | SUBLINGUAL | 2 refills | Status: DC | PRN

## 2016-06-04 MED ORDER — AMLODIPINE BESYLATE 10 MG PO TABS: 10.0000 mg | ORAL_TABLET | Freq: Every day | ORAL | 3 refills | Status: DC

## 2016-06-04 NOTE — Progress Notes (Signed)
Agree with the pharmacy student's note as documented. Patient may be having some adverse effects from taking the carvedilol twice a day now - no s/sx of MI/PE. Will increase amlodipine to hopefully get blood pressure to goal.   Nicoletta Ba, PharmD, BCPS, BCACP, [facility] [phone]

## 2016-06-04 NOTE — Progress Notes (Signed)
Patient arrives in good spirits with daughter serving as Optometrist.  She presents to the clinic for hypertension evaluation. Diagnosed with Hypertension in the year of 2010. Patient last seen by Dr. Adrian Blackwater on 05/21/2016 and referred on 05/21/2016.  Patient reports adherence with medications.  Current BP Medications include:  Carvedilol 25mg  BID, amlodipine 5mg  daily  Antihypertensives tried in the past include: HCTZ, isosorbide mononitrate, losartan, spironolactone, valsartan   O:   Last 3 Office BP readings: Vitals:   06/04/16 1130 06/04/16 1131  BP: (!) 158/88 (!) 158/88  Pulse: 73 72    BP Readings from Last 3 Encounters:  06/04/16 (!) 158/88  05/21/16 (!) 205/93  12/01/15 (!) 150/80    BMET    Component Value Date/Time   NA 141 05/21/2016 1236   K 4.0 05/21/2016 1236   CL 104 05/21/2016 1236   CO2 28 05/21/2016 1236   GLUCOSE 114 (H) 05/21/2016 1236   BUN 16 05/21/2016 1236   CREATININE 0.87 05/21/2016 1236   CALCIUM 9.2 05/21/2016 1236   GFRNONAA 66 05/21/2016 1236   GFRAA 76 05/21/2016 1236    A/P:  History of hypertension since 2010 which currently is not controlled on current medications per BP in clinic today. Patient reports intermittent chest pain and feeling "bad" when taking carvedilol BID. Goal BP <140/90 per ASH/ISH. Continue carvedilol 25mg  BID, increase amlodipine to 10mg  daily. Nitroglycerin refilled and patient counseled on use. Patient instructed to bring all medication bottles to next visit.        Results reviewed and written information provided.  F/U Clinic Visit in one week. Total time in face-to-face counseling 30 minutes.  Patient seen with Deirdre Pippins PGY1 Pharmacy Resident.

## 2016-06-04 NOTE — Patient Instructions (Addendum)
Thank you for coming to see [place] today! Increase amlodipine to 10 mg daily Continue taking the carvedilol 1 tablet two times a day A refill was sent to the pharmacy for nitroglycerin and amlodipine Come back to see [place] in 1 week and please bring all of your medications with you    Plan de alimentacin DASH (DASH Eating Plan) DASH es la sigla en ingls de "Enfoques Alimentarios para Detener la Hipertensin". El plan de alimentacin DASH ha demostrado bajar la presin arterial elevada (hipertensin). Los beneficios adicionales para la salud pueden incluir la disminucin del riesgo de diabetes mellitus tipo2, enfermedades cardacas e ictus. Este plan tambin puede ayudar a Horticulturist, commercial. QU DEBO SABER ACERCA DEL PLAN DE ALIMENTACIN DASH? Para el plan de alimentacin DASH, seguir las siguientes pautas generales:  Elija los alimentos con un valor porcentual diario de sodio de menos del 5% (segn figura en la etiqueta del alimento).  Use hierbas o aderezos sin sal, en lugar de sal de mesa o sal marina.  Consulte al mdico o farmacutico antes de usar sustitutos de la sal.  Coma productos con bajo contenido de sodio, cuya etiqueta suele decir "bajo contenido de sodio" o "sin agregado de sal".  Coma alimentos frescos.  Coma ms verduras, frutas y productos lcteos con bajo contenido de [place].  Elija los cereales integrales. Busque la palabra "integral" en Equities trader de la lista de ingredientes.  Elija el pescado y el pollo o el pavo sin piel ms a menudo que las carnes rojas. Limite el consumo de pescado, carne de ave y carne a 6onzas (170g) por Training and development officer.  Limite el consumo de dulces, postres, azcares y bebidas azucaradas.  Elija las grasas saludables para el corazn.  Limite el consumo de queso a 1onza (28g) por Training and development officer.  Consuma ms comida casera y menos de restaurante, de buf y comida rpida.  Limite el consumo de alimentos fritos.  Cocine los alimentos utilizando mtodos que no  sean la fritura.  Limite las verduras enlatadas. Si las consume, enjuguelas bien para disminuir el sodio.  Cuando coma en un restaurante, pida que preparen su comida con menos sal o, en lo posible, sin nada de sal. QU ALIMENTOS PUEDO COMER? Pida ayuda a un nutricionista para conocer las necesidades calricas individuales. Cereales Pan de salvado o integral. Arroz integral. Pastas de salvado o integrales. Quinua, trigo burgol y cereales integrales. Cereales con bajo contenido de sodio. Tortillas de harina de maz o de salvado. Pan de maz integral. Galletas saladas integrales. Galletas con bajo contenido de [place]. Vegetales Verduras frescas o congeladas (crudas, al vapor, asadas o grilladas). Jugos de tomate y verduras con contenido bajo o reducido de sodio. Pasta y salsa de tomate con contenido bajo o [place]. Verduras enlatadas con bajo contenido de sodio o reducido de sodio.  Lambert Mody Lambert Mody frescas, en conserva (en su jugo natural) o frutas congeladas. Carnes y otros productos con protenas Carne de res molida (al 85% o ms [place]), carne de res de animales alimentados con pastos o carne de res sin la grasa. Pollo o pavo sin piel. Carne de pollo o de [place]. Cerdo sin la grasa. Todos los pescados y frutos de mar. Huevos. Porotos, guisantes o lentejas secos. Frutos secos y semillas sin sal. Frijoles enlatados sin sal. Lcteos Productos lcteos con bajo contenido de grasas, como [place] o al 1%, quesos reducidos en grasas o al 2%, ricota con bajo contenido de grasas o queso cottage, o yogur natural con bajo contenido  de grasas. Quesos con contenido bajo o reducido de sodio. Grasas y Naval architect en barra que no contengan grasas trans. [place] y alios para ensaladas livianos o reducidos en grasas (reducidos en sodio). [place]. Aceites de crtamo, oliva o canola. [place] natural de man o almendra. Otros Palomitas de maz y pretzels sin sal. Los artculos  mencionados arriba pueden no ser [employer] de las bebidas o los alimentos recomendados. Comunquese con el nutricionista para conocer ms opciones. QU ALIMENTOS NO SE RECOMIENDAN? Cereales Pan blanco. Pastas blancas. Arroz blanco. Pan de maz refinado. Bagels y croissants. Galletas saladas que contengan grasas trans. Vegetales Vegetales con crema o fritos. Verduras en [place]. Verduras enlatadas comunes. Pasta y salsa de tomate en lata comunes. Jugos comunes de tomate y de verduras. Lambert Mody Frutas secas. Fruta enlatada en almbar liviano o espeso. Jugo de frutas. Carnes y otros productos con protenas [place] de carne con Lobbyist. Costillas, alas de pollo, tocineta, salchicha, mortadela, salame, chinchulines, tocino, perros calientes, salchichas alemanas y embutidos envasados. Frutos secos y semillas con sal. Frijoles con sal en lata. Lcteos Leche entera o al 2%, crema, mezcla de [place] y crema, y queso crema. Yogur entero o endulzado. Quesos o queso azul con alto contenido de Physicist, medical. [place] no lcteas y coberturas batidas. Quesos procesados, quesos para untar o cuajadas. Condimentos Sal de cebolla y ajo, sal condimentada, sal de mesa y sal marina. Salsas en lata y envasadas. Salsa [place]. Salsa trtara. Salsa barbacoa. Salsa teriyaki. Salsa de soja, incluso la que tiene contenido reducido de [place]. Salsa de carne. Salsa de pescado. Salsa de [place]. Salsa rosada. Rbano picante. Ketchup y mostaza. Saborizantes y tiernizantes para carne. Caldo en cubitos. Salsa picante. Salsa tabasco. Adobos. Aderezos para tacos. Salsas. Grasas y aceites [place], [place] en barra, [place] Bonito de [place], [place], [place] clarificada y Wendee Copp de tocino. Aceites de coco, de palmiste o de palma. Aderezos comunes para ensalada. Otros Pickles y [place]. Palomitas de maz y pretzels con sal. Los artculos mencionados arriba pueden no ser [employer] de las bebidas y los alimentos que se  Higher education careers adviser. Comunquese con el nutricionista para obtener ms informacin. DNDE Dolan Amen MS INFORMACIN? [place], del Pulmn y de Herbalist (National Heart, Lung, and [place]): travelstabloid.com   Esta informacin no tiene Marine scientist el consejo del mdico. Asegrese de hacerle al mdico cualquier pregunta que tenga.   Document Released: 08/15/2011 Document Revised: 09/16/2014 Elsevier Interactive Patient Education [employer].

## 2016-06-05 ENCOUNTER — Telehealth: Payer: Self-pay

## 2016-06-05 NOTE — Telephone Encounter (Signed)
Pt was called and informed of results on 9/27. 

## 2016-07-05 ENCOUNTER — Ambulatory Visit: Payer: Medicare Other | Attending: Internal Medicine | Admitting: Physician Assistant

## 2016-07-05 VITALS — BP 143/75 | HR 80 | Temp 98.2°F | Resp 16 | Wt 142.0 lb

## 2016-07-05 DIAGNOSIS — E119 Type 2 diabetes mellitus without complications: Secondary | ICD-10-CM | POA: Insufficient documentation

## 2016-07-05 DIAGNOSIS — E785 Hyperlipidemia, unspecified: Secondary | ICD-10-CM | POA: Insufficient documentation

## 2016-07-05 DIAGNOSIS — Z7982 Long term (current) use of aspirin: Secondary | ICD-10-CM | POA: Diagnosis not present

## 2016-07-05 DIAGNOSIS — G8929 Other chronic pain: Secondary | ICD-10-CM

## 2016-07-05 DIAGNOSIS — I10 Essential (primary) hypertension: Secondary | ICD-10-CM | POA: Diagnosis not present

## 2016-07-05 DIAGNOSIS — M549 Dorsalgia, unspecified: Secondary | ICD-10-CM | POA: Diagnosis present

## 2016-07-05 DIAGNOSIS — M5441 Lumbago with sciatica, right side: Secondary | ICD-10-CM | POA: Diagnosis not present

## 2016-07-05 DIAGNOSIS — R0789 Other chest pain: Secondary | ICD-10-CM | POA: Diagnosis not present

## 2016-07-05 DIAGNOSIS — K219 Gastro-esophageal reflux disease without esophagitis: Secondary | ICD-10-CM | POA: Diagnosis not present

## 2016-07-05 DIAGNOSIS — Z79899 Other long term (current) drug therapy: Secondary | ICD-10-CM | POA: Insufficient documentation

## 2016-07-05 DIAGNOSIS — I2510 Atherosclerotic heart disease of native coronary artery without angina pectoris: Secondary | ICD-10-CM | POA: Diagnosis not present

## 2016-07-05 DIAGNOSIS — R413 Other amnesia: Secondary | ICD-10-CM | POA: Diagnosis not present

## 2016-07-05 DIAGNOSIS — M5442 Lumbago with sciatica, left side: Secondary | ICD-10-CM | POA: Insufficient documentation

## 2016-07-05 MED ORDER — MEMANTINE HCL 10 MG PO TABS: 10.0000 mg | ORAL_TABLET | Freq: Every day | ORAL | 3 refills | Status: DC

## 2016-07-05 MED ORDER — MELOXICAM 15 MG PO TABS: 15.0000 mg | ORAL_TABLET | ORAL | 1 refills | Status: DC

## 2016-07-05 MED ORDER — CLOPIDOGREL BISULFATE 75 MG PO TABS: 75.0000 mg | ORAL_TABLET | Freq: Every day | ORAL | 3 refills | Status: DC

## 2016-07-05 MED ORDER — CARVEDILOL 25 MG PO TABS: 25.0000 mg | ORAL_TABLET | Freq: Two times a day (BID) | ORAL | 3 refills | Status: DC

## 2016-07-05 NOTE — Progress Notes (Signed)
Pt is in the office today for hypertension Pt states she is having back pain to where it goes down her leg Pt states she has cramps in her feet Pt states she doesn't have an apptetie Pt states she has to force herself to eat Pt states she needs her clopidogrel 75mg  and carvediol 25mg  and gabapentin 300mg  refilled

## 2016-07-05 NOTE — Progress Notes (Signed)
Wendy Petty, is a 75 y.o. female  [id number]  [id number]  DOB - 11-08-1940  Subjective:  Chief Complaint and HPI: Wendy Petty is a 75 y.o. female here today for arthritis pain in her back, B hips, B legs, and both feet.  This is an ongoing problem >16months.  She has been taking gabapentin(which helps) at night but she says it makes her dizzy and nauseated.  She would like to try something different.  [employer] interpre[employer] used. NKI.    ROS:   Constitutional:  No f/c, No night sweats, No unexplained weight loss. EENT:  No vision changes, No blurry vision, No hearing changes. No mouth, throat, or ear problems.  Respiratory: No cough, No SOB Cardiac: No CP, no palpitations GI:  No abd pain, No N/V/D. GU: No Urinary s/sx Musculoskeletal: +pain as above Neuro: No headache, no motor weakness.  Skin: No rash Endocrine:  No polydipsia. No polyuria.  Psych: Denies SI/HI  No problems updated.  ALLERGIES: No Known Allergies  PAST MEDICAL HISTORY: Past Medical History:  Diagnosis Date  . ANEMIA, CHRONIC   . CHEST PAIN UNSPECIFIED   . Diabetes mellitus without complication ([address] [place]) [id number]  . Edema   . GERD (gastroesophageal reflux disease)   . Hx of cardiovascular stress test    Lexiscan Myoview (11/14):  No ischemia, EF 68% (normal study)  . HYPERLIPIDEMIA   . Hypertension   . PULMONARY NODULE   . THYROID NODULE     MEDICATIONS AT HOME: Prior to Admission medications   Medication Sig Start Date End Date Taking? Authorizing Provider  amLODipine (NORVASC) 10 MG tablet Take 1 tablet (10 mg total) by mouth daily. 06/04/16  Yes Tresa Garter, MD  aspirin 81 MG tablet Take 1 tablet (81 mg total) by mouth daily. 05/21/16  Yes Josalyn Funches, MD  atorvastatin (LIPITOR) 40 MG tablet Take 1 tablet (40 mg total) by mouth daily. 05/21/16  Yes Josalyn Funches, MD  carvedilol (COREG) 25 MG tablet Take 1 tablet (25 mg total) by mouth 2 (two) times daily with a  meal. 07/05/16  Yes Argentina Donovan, PA-C  clopidogrel (PLAVIX) 75 MG tablet Take 1 tablet (75 mg total) by mouth daily. 07/05/16  Yes Argentina Donovan, PA-C  levothyroxine (SYNTHROID) 25 MCG tablet TAKE 1 TABLET BY MOUTH ONCE DAILY 05/21/16  Yes Josalyn Funches, MD  memantine (NAMENDA) 10 MG tablet Take 1 tablet (10 mg total) by mouth daily. 07/05/16  Yes Dionne Bucy Theadora Noyes, PA-C  ranitidine (ZANTAC) 300 MG tablet Take 1 tablet (300 mg total) by mouth at bedtime. 05/21/16  Yes Josalyn Funches, MD  acetaminophen (TYLENOL 8 HOUR) 650 MG CR tablet Take 1 tablet (650 mg total) by mouth every 8 (eight) hours as needed for pain. 10/30/15   Josalyn Funches, MD  b complex vitamins capsule Take 1 capsule by mouth daily. Patient not taking: Reported on 07/05/2016 05/21/16   Boykin Nearing, MD  docusate sodium (COLACE) 100 MG capsule Take 1 capsule (100 mg total) by mouth daily. To prevent constipation while taking narcotic pain reliever like Vicodin (hydrocodone). Patient not taking: Reported on 07/05/2016 06/24/15   Charlesetta Shanks, MD  donepezil (ARICEPT) 5 MG tablet Take 2 tablets (10 mg total) by mouth daily. Patient not taking: Reported on 07/05/2016 05/21/16   Boykin Nearing, MD  fluticasone (FLONASE) 50 MCG/ACT nasal spray Place 2 sprays into both nostrils daily. Patient not taking: Reported on 07/05/2016 05/08/16   Boykin Nearing, MD  hydrocortisone cream 1 %  Apply 1 application topically 2 (two) times daily. Patient not taking: Reported on 07/05/2016 10/30/15   Boykin Nearing, MD  loratadine (CLARITIN) 10 MG tablet Take 1 tablet (10 mg total) by mouth daily. Patient not taking: Reported on 07/05/2016 09/26/15   Brayton Caves, PA-C  meloxicam (MOBIC) 15 MG tablet Take 1 tablet (15 mg total) by mouth 1 day or 1 dose. Prn pain 07/05/16   Argentina Donovan, PA-C  nitroGLYCERIN (NITROSTAT) 0.4 MG SL tablet Place 1 tablet (0.4 mg total) under the tongue every 5 (five) minutes as needed for chest  pain. Patient not taking: Reported on 07/05/2016 06/04/16   Tresa Garter, MD  pregabalin (LYRICA) 25 MG capsule Take 1 capsule (25 mg total) by mouth 3 (three) times daily. Patient not taking: Reported on 07/05/2016 05/21/16   Boykin Nearing, MD     Objective:  EXAM:   Vitals:   07/05/16 1554  BP: (!) 143/75  Pulse: 80  Resp: 16  Temp: 98.2 F (36.8 C)  TempSrc: Oral  SpO2: 100%  Weight: 142 lb (64.4 kg)    General appearance : A&OX3. NAD. Non-toxic-appearing HEENT: Atraumatic and Normocephalic.  PERRLA. EOM intact.  TM clear B. Mouth-MMM, post pharynx WNL w/o erythema, No PND. Neck: supple, no JVD. No cervical lymphadenopathy. No thyromegaly Chest/Lungs:  Breathing-non-labored, Good air entry bilaterally, breath sounds normal without rales, rhonchi, or wheezing  CVS: S1 S2 regular, no murmurs, gallops, rubs  Extremities: Bilateral Lower Ext shows no edema, both legs are warm to touch with = pulse throughout.  DTR U&LE=throughout.  Neg SLR B Neurology:  CN II-XII grossly intact, Non focal.   Psych:  TP linear. J/I WNL. Normal speech. Appropriate eye contact and affect.  Skin:  No Rash  Data Review Lab Results  Component Value Date   HGBA1C 6.5 05/21/2016   HGBA1C 7.20 09/29/2015   HGBA1C 6.5 04/28/2014     Assessment & Plan   1. Chronic midline low back pain with bilateral sciatica Stop gabapentin.  Try Meloxicam 15mg  daily prn with food  2. Essential hypertension - carvedilol (COREG) 25 MG tablet; Take 1 tablet (25 mg total) by mouth 2 (two) times daily with a meal.  Dispense: 180 tablet; Refill: 3  3. Other chest pain None currently-running out of meds - carvedilol (COREG) 25 MG tablet; Take 1 tablet (25 mg total) by mouth 2 (two) times daily with a meal.  Dispense: 180 tablet; Refill: 3  4. Atherosclerosis of native coronary artery of native heart without angina pectoris - clopidogrel (PLAVIX) 75 MG tablet; Take 1 tablet (75 mg total) by mouth daily.   Dispense: 90 tablet; Refill: 3  5. Memory deficit - memantine (NAMENDA) 10 MG tablet; Take 1 tablet (10 mg total) by mouth daily.  Dispense: 90 tablet; Refill: 3  Patient have been counseled extensively about nutrition and exercise  Return in about 4 weeks (around 08/02/2016) for dr Adrian Blackwater f/up body pain.  The patient was given clear instructions to go to ER or return to medical center if symptoms don't improve, worsen or new problems develop. The patient verbalized understanding. The patient was told to call to get lab results if they haven't heard anything in the next week.     Freeman Caldron, PA-C [facility] [place] [place], [place]   07/05/2016, 5:20 PMPatient ID: [name], female   DOB: 03-Sep-1941, 75 y.o.   MRN: [id number]

## 2016-07-18 ENCOUNTER — Other Ambulatory Visit: Payer: Self-pay | Admitting: Physician Assistant

## 2016-07-18 MED ORDER — MELOXICAM 15 MG PO TABS: 15.0000 mg | ORAL_TABLET | ORAL | 1 refills | Status: DC

## 2016-07-26 ENCOUNTER — Ambulatory Visit: Payer: Medicare Other | Attending: Family Medicine | Admitting: Family Medicine

## 2016-07-26 ENCOUNTER — Encounter: Payer: Self-pay | Admitting: Family Medicine

## 2016-07-26 VITALS — BP 137/71 | HR 67 | Temp 97.8°F | Wt 144.0 lb

## 2016-07-26 DIAGNOSIS — Z23 Encounter for immunization: Secondary | ICD-10-CM

## 2016-07-26 DIAGNOSIS — E038 Other specified hypothyroidism: Secondary | ICD-10-CM | POA: Diagnosis not present

## 2016-07-26 DIAGNOSIS — Z79899 Other long term (current) drug therapy: Secondary | ICD-10-CM | POA: Insufficient documentation

## 2016-07-26 DIAGNOSIS — K219 Gastro-esophageal reflux disease without esophagitis: Secondary | ICD-10-CM | POA: Diagnosis not present

## 2016-07-26 DIAGNOSIS — R42 Dizziness and giddiness: Secondary | ICD-10-CM | POA: Insufficient documentation

## 2016-07-26 DIAGNOSIS — I10 Essential (primary) hypertension: Secondary | ICD-10-CM | POA: Insufficient documentation

## 2016-07-26 DIAGNOSIS — E119 Type 2 diabetes mellitus without complications: Secondary | ICD-10-CM | POA: Diagnosis not present

## 2016-07-26 DIAGNOSIS — M5441 Lumbago with sciatica, right side: Secondary | ICD-10-CM | POA: Insufficient documentation

## 2016-07-26 DIAGNOSIS — I2510 Atherosclerotic heart disease of native coronary artery without angina pectoris: Secondary | ICD-10-CM | POA: Insufficient documentation

## 2016-07-26 DIAGNOSIS — E7800 Pure hypercholesterolemia, unspecified: Secondary | ICD-10-CM | POA: Insufficient documentation

## 2016-07-26 DIAGNOSIS — G8929 Other chronic pain: Secondary | ICD-10-CM

## 2016-07-26 DIAGNOSIS — E1129 Type 2 diabetes mellitus with other diabetic kidney complication: Secondary | ICD-10-CM | POA: Diagnosis not present

## 2016-07-26 DIAGNOSIS — Z7982 Long term (current) use of aspirin: Secondary | ICD-10-CM | POA: Diagnosis not present

## 2016-07-26 DIAGNOSIS — R809 Proteinuria, unspecified: Secondary | ICD-10-CM

## 2016-07-26 DIAGNOSIS — M170 Bilateral primary osteoarthritis of knee: Secondary | ICD-10-CM | POA: Insufficient documentation

## 2016-07-26 DIAGNOSIS — M5442 Lumbago with sciatica, left side: Secondary | ICD-10-CM | POA: Insufficient documentation

## 2016-07-26 LAB — GLUCOSE, POCT (MANUAL RESULT ENTRY): POC Glucose: 123 mg/dl — AB (ref 70–99)

## 2016-07-26 MED ORDER — ASPIRIN 81 MG PO TABS: 81.0000 mg | ORAL_TABLET | Freq: Every day | ORAL | 3 refills | Status: DC

## 2016-07-26 MED ORDER — ATORVASTATIN CALCIUM 40 MG PO TABS: 40.0000 mg | ORAL_TABLET | Freq: Every day | ORAL | 3 refills | Status: DC

## 2016-07-26 MED ORDER — GABAPENTIN 100 MG PO CAPS: 100.0000 mg | ORAL_CAPSULE | Freq: Three times a day (TID) | ORAL | 3 refills | Status: DC

## 2016-07-26 MED ORDER — CARVEDILOL 25 MG PO TABS: 25.0000 mg | ORAL_TABLET | Freq: Two times a day (BID) | ORAL | 3 refills | Status: DC

## 2016-07-26 MED ORDER — AMLODIPINE BESYLATE 10 MG PO TABS: 10.0000 mg | ORAL_TABLET | Freq: Every day | ORAL | 3 refills | Status: DC

## 2016-07-26 MED ORDER — DICLOFENAC SODIUM 1 % TD GEL
4.0000 g | Freq: Four times a day (QID) | TRANSDERMAL | 3 refills | Status: DC
Start: 2016-07-26 — End: 2017-06-20

## 2016-07-26 MED ORDER — LEVOTHYROXINE SODIUM 25 MCG PO TABS: ORAL_TABLET | ORAL | 3 refills | Status: DC

## 2016-07-26 NOTE — Patient Instructions (Addendum)
Wendy Petty was seen today for diabetes.  Diagnoses and all orders for this visit:  Controlled type 2 diabetes mellitus without complication, without long-term current use of insulin (HCC) -     POCT glucose (manual entry) -     Microalbumin/Creatinine Ratio, Urine  Pure hypercholesterolemia -     atorvastatin (LIPITOR) 40 MG tablet; Take 1 tablet (40 mg total) by mouth daily.  Atherosclerosis of native coronary artery of native heart without angina pectoris -     aspirin 81 MG tablet; Take 1 tablet (81 mg total) by mouth daily.  Other specified hypothyroidism -     levothyroxine (SYNTHROID) 25 MCG tablet; TAKE 1 TABLET BY MOUTH ONCE DAILY  Essential hypertension -     amLODipine (NORVASC) 10 MG tablet; Take 1 tablet (10 mg total) by mouth daily. -     carvedilol (COREG) 25 MG tablet; Take 1 tablet (25 mg total) by mouth 2 (two) times daily with a meal.  Primary osteoarthritis of both knees -     gabapentin (NEURONTIN) 100 MG capsule; Take 1 capsule (100 mg total) by mouth 3 (three) times daily. -     diclofenac sodium (VOLTAREN) 1 % GEL; Apply 4 g topically 4 (four) times daily.  Chronic midline low back pain with bilateral sciatica -     diclofenac sodium (VOLTAREN) 1 % GEL; Apply 4 g topically 4 (four) times daily.  Need for prophylactic vaccination against Streptococcus pneumoniae (pneumococcus) -     Pneumococcal polysaccharide vaccine 23-valent greater than or equal to 2yo subcutaneous/IM   F/u in 3 months for osteoarthritis and HTN   Dr. Adrian Blackwater

## 2016-07-26 NOTE — Progress Notes (Signed)
Pt is here today to follow up on diabetes and medication refills. Pt states that she needs medication to last her for 3 months, [place] is leaving the country.  Refills:levothyroxine,atorvastatin,asprin.

## 2016-07-26 NOTE — Assessment & Plan Note (Signed)
Controlled Med: compliant Continue current regimen

## 2016-07-26 NOTE — Progress Notes (Signed)
Subjective:  Patient ID: Wendy Petty, female    DOB: 29-Oct-1940  Age: 75 y.o. MRN: [id number]  CC: Diabetes   HPI Wendy Petty she is returning to  [place].   1. CHRONIC HYPERTENSION  Disease Monitoring  Blood pressure range: not checking   Chest pain: no   Dyspnea: no   Claudication: no   Medication compliance: yes Medication Side Effects  Lightheadedness: no   Urinary frequency: no   Edema: no    2. DM2: diet controlled. No polyuria or polydipsia.   3. Back, hip and knee: L side radiates to L foot. Sharp pains. He back and hip pain is worsening. She stated lyrica 25 mg TID while in [place] which helped with her pain. She was unable to get refill for it here. She tried mobic but had GI upset so stopped taking it. She returned to taking gabapentin 300 mg TID but experiences dizziness. She request a decreased dose.  H  4. Hypothyroidism: taking synthroid. No weight changes. No swelling. No mood changes.  5. GERD: takes prilosec. Request refill. No stomach pain.   Social History  Substance Use Topics  . Smoking status: Never Smoker  . Smokeless tobacco: Never Used  . Alcohol use No     Outpatient Medications Prior to Visit  Medication Sig Dispense Refill  . acetaminophen (TYLENOL 8 HOUR) 650 MG CR tablet Take 1 tablet (650 mg total) by mouth every 8 (eight) hours as needed for pain. 90 tablet 5  . amLODipine (NORVASC) 10 MG tablet Take 1 tablet (10 mg total) by mouth daily. 90 tablet 3  . aspirin 81 MG tablet Take 1 tablet (81 mg total) by mouth daily. 90 tablet 3  . atorvastatin (LIPITOR) 40 MG tablet Take 1 tablet (40 mg total) by mouth daily. 90 tablet 3  . carvedilol (COREG) 25 MG tablet Take 1 tablet (25 mg total) by mouth 2 (two) times daily with a meal. 180 tablet 3  . clopidogrel (PLAVIX) 75 MG tablet Take 1 tablet (75 mg total) by mouth daily. 90 tablet 3  . levothyroxine (SYNTHROID) 25 MCG tablet TAKE 1 TABLET BY MOUTH ONCE DAILY 90 tablet  3  . meloxicam (MOBIC) 15 MG tablet Take 1 tablet (15 mg total) by mouth 1 day or 1 dose. Prn pain 90 tablet 1  . memantine (NAMENDA) 10 MG tablet Take 1 tablet (10 mg total) by mouth daily. 90 tablet 3  . ranitidine (ZANTAC) 300 MG tablet Take 1 tablet (300 mg total) by mouth at bedtime. 90 tablet 3  . b complex vitamins capsule Take 1 capsule by mouth daily. (Patient not taking: Reported on 07/26/2016) 90 capsule 3  . docusate sodium (COLACE) 100 MG capsule Take 1 capsule (100 mg total) by mouth daily. To prevent constipation while taking narcotic pain reliever like Vicodin (hydrocodone). (Patient not taking: Reported on 07/26/2016) 30 capsule 0  . donepezil (ARICEPT) 5 MG tablet Take 2 tablets (10 mg total) by mouth daily. (Patient not taking: Reported on 07/26/2016) 60 tablet 0  . fluticasone (FLONASE) 50 MCG/ACT nasal spray Place 2 sprays into both nostrils daily. (Patient not taking: Reported on 07/26/2016) 16 g 0  . hydrocortisone cream 1 % Apply 1 application topically 2 (two) times daily. (Patient not taking: Reported on 07/26/2016) 60 g 5  . loratadine (CLARITIN) 10 MG tablet Take 1 tablet (10 mg total) by mouth daily. (Patient not taking: Reported on 07/26/2016) 30 tablet 3  . nitroGLYCERIN (NITROSTAT) 0.4  MG SL tablet Place 1 tablet (0.4 mg total) under the tongue every 5 (five) minutes as needed for chest pain. (Patient not taking: Reported on 07/26/2016) 15 tablet 2  . pregabalin (LYRICA) 25 MG capsule Take 1 capsule (25 mg total) by mouth 3 (three) times daily. (Patient not taking: Reported on 07/26/2016) 90 capsule 5   No facility-administered medications prior to visit.     ROS Review of Systems  Constitutional: Negative for chills and fever.  Eyes: Negative for visual disturbance.  Respiratory: Negative for shortness of breath.   Cardiovascular: Negative for chest pain.  Gastrointestinal: Negative for abdominal pain and blood in stool.  Endocrine: Negative for polyphagia and  polyuria.  Musculoskeletal: Positive for arthralgias and back pain.  Skin: Negative for rash.  Allergic/Immunologic: Negative for immunocompromised state.  Hematological: Negative for adenopathy. Does not bruise/bleed easily.  Psychiatric/Behavioral: Negative for dysphoric mood and suicidal ideas.    Objective:  BP 137/71 (BP Location: Left Arm, Patient Position: Sitting, Cuff Size: Small)   Pulse 67   Temp 97.8 F (36.6 C) (Oral)   Wt 144 lb (65.3 kg)   SpO2 99%   BMI 26.34 kg/m   BP/Weight 07/26/2016 07/05/2016 [id number]  Systolic BP 0000000 [id number] 0000000  Diastolic BP 71 75 88  Wt. (Lbs) 144 142 -  BMI 26.34 25.97 -    Physical Exam  Constitutional: She is oriented to person, place, and time. She appears well-developed and well-nourished. No distress.  Elderly female   HENT:  Head: Normocephalic and atraumatic.  Neck: No thyromegaly present.  Cardiovascular: Normal rate, regular rhythm, normal heart sounds and intact distal pulses.   Pulmonary/Chest: Effort normal and breath sounds normal.  Musculoskeletal: She exhibits no edema.  Neurological: She is alert and oriented to person, place, and time.  Skin: Skin is warm and dry. No rash noted.  Psychiatric: She has a normal mood and affect.    Lab Results  Component Value Date   HGBA1C 6.5 05/21/2016   CBG 123  Treated high BP with clonidine 0.2 mg PO x one   Assessment & Plan:   Wendy Petty was seen today for diabetes.  Diagnoses and all orders for this visit:  Controlled type 2 diabetes mellitus without complication, without long-term current use of insulin (HCC) -     POCT glucose (manual entry) -     Microalbumin/Creatinine Ratio, Urine  Pure hypercholesterolemia -     atorvastatin (LIPITOR) 40 MG tablet; Take 1 tablet (40 mg total) by mouth daily.  Atherosclerosis of native coronary artery of native heart without angina pectoris -     aspirin 81 MG tablet; Take 1 tablet (81 mg total) by mouth daily.  Other  specified hypothyroidism -     levothyroxine (SYNTHROID) 25 MCG tablet; TAKE 1 TABLET BY MOUTH ONCE DAILY  Essential hypertension -     carvedilol (COREG) 25 MG tablet; Take 1 tablet (25 mg total) by mouth 2 (two) times daily with a meal.  Other chest pain -     carvedilol (COREG) 25 MG tablet; Take 1 tablet (25 mg total) by mouth 2 (two) times daily with a meal.  Primary osteoarthritis of both knees -     gabapentin (NEURONTIN) 100 MG capsule; Take 1 capsule (100 mg total) by mouth 3 (three) times daily. -     diclofenac sodium (VOLTAREN) 1 % GEL; Apply 4 g topically 4 (four) times daily.  Chronic midline low back pain with bilateral sciatica -  diclofenac sodium (VOLTAREN) 1 % GEL; Apply 4 g topically 4 (four) times daily.  Other orders -     amLODipine (NORVASC) 10 MG tablet; Take 1 tablet (10 mg total) by mouth daily.   No orders of the defined types were placed in this encounter.   Follow-up: Return in about 3 months (around 10/26/2016) for osteoarthriits, HTN, .   Boykin Nearing MD

## 2016-07-26 NOTE — Assessment & Plan Note (Signed)
choronic pain in low back, hips, knees in elderly patient Intolerant of oral NSAID  Plan: Gabapentin 100 mg TID voltaren gel Advised patient to not take lyrica and gabapentin when she returns to [place] She may take lyrica alone

## 2016-07-27 LAB — MICROALBUMIN / CREATININE URINE RATIO
Creatinine, Urine: 167 mg/dL (ref 20–320)
Microalb Creat Ratio: 154 mcg/mg creat — ABNORMAL HIGH (ref <30)
Microalb, Ur: 25.8 mg/dL

## 2016-07-30 MED ORDER — LISINOPRIL 10 MG PO TABS: 10.0000 mg | ORAL_TABLET | Freq: Every day | ORAL | 3 refills | Status: DC

## 2016-07-30 NOTE — Assessment & Plan Note (Signed)
Elevated urine microalbumin Patient advised to add lisinopril 10 mg daily to improve BP and improve blood flow to kidneys and reduce diabetic kidney disease

## 2016-07-30 NOTE — Addendum Note (Signed)
Addended by: Boykin Nearing on: 07/30/2016 01:43 PM   Modules accepted: Orders

## 2016-07-31 ENCOUNTER — Telehealth: Payer: Self-pay

## 2016-07-31 NOTE — Telephone Encounter (Signed)
Pt was called and a VM was left informing pt to return phone call for lab results.  If pt call back let her know:  Elevated urine microalbumin  Patient advised to add lisinopril 10 mg daily to improve BP and improve blood flow to kidneys and reduce diabetic kidney disease

## 2016-12-27 IMAGING — MR MR HEAD WO/W CM
10 of 11 series · 32 of 48 positions shown · IV contrast (multihance)
Comparison: 06/29/2013

CLINICAL DATA: Bilateral hearing loss and visual disturbance
worsened recently.

Creatinine was obtained on site at [HOSPITAL] at [HOSPITAL].
Results: Creatinine 1.0 mg/dL.
EXAM:
MRI HEAD WITHOUT AND WITH CONTRAST
TECHNIQUE: Multiplanar, multiecho pulse sequences of the brain and surrounding
structures were obtained without and with intravenous contrast.
CONTRAST:  13mL MULTIHANCE GADOBENATE DIMEGLUMINE 529 MG/ML IV SOLN

[Series 3: T1 · sagittal · 5.0mm · 0.45mm/px · 4 of 21 slices shown (1 of 3)]
[im 1/21]
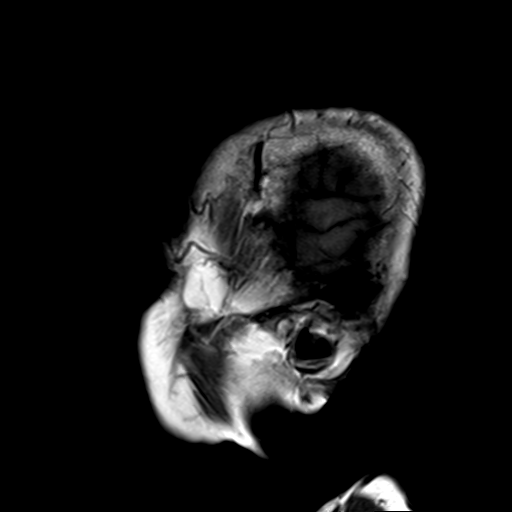
[im 7/21]
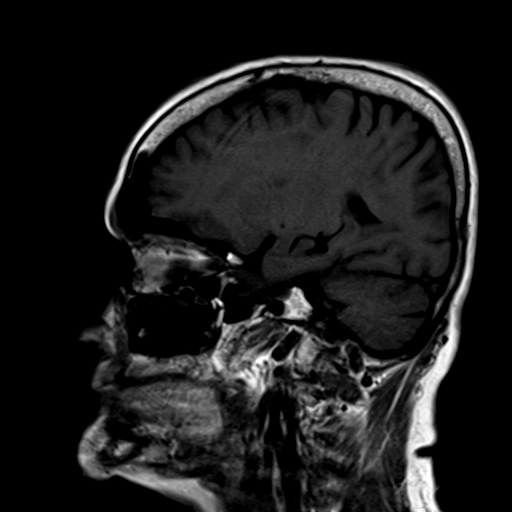
[im 14/21]
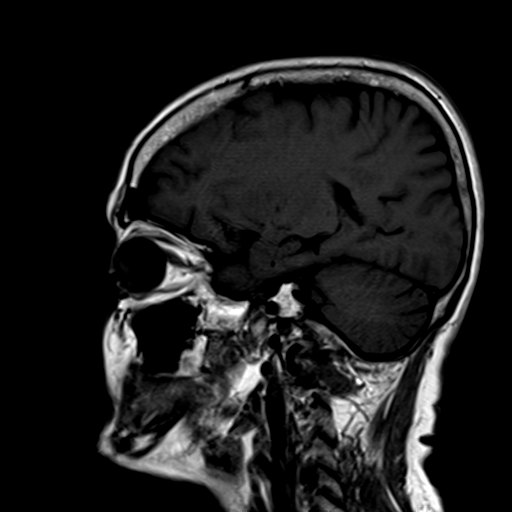
[im 21/21]
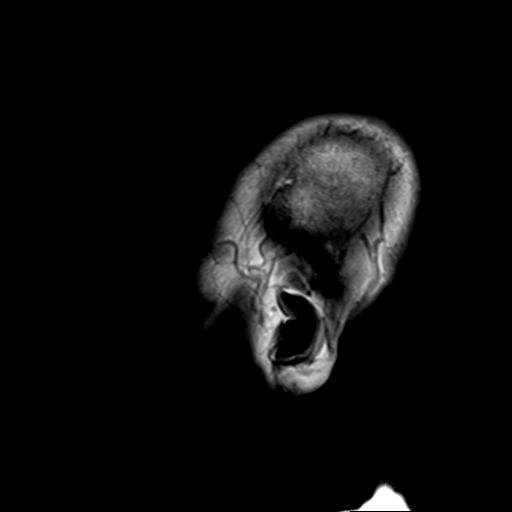

[Series 4: DWI · axial · 3.0mm · 1.80mm/px · z∈[+22,+168]mm · 8 of 99 slices shown (1 of 2)]
[im 1/99]
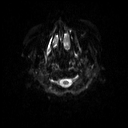
[im 16/99]
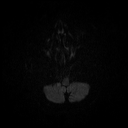
[im 31/99]
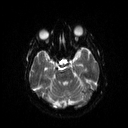
[im 46/99]
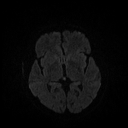
[im 53/99]
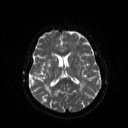
[im 68/99]
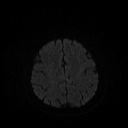
[im 83/99]
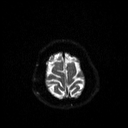
[im 99/99]
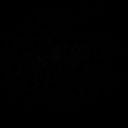

[Series 5: DWI · axial · 3.0mm · 1.80mm/px · z∈[+22,+168]mm · 6 of 48 slices shown (2 of 2)]
[im 1/48]
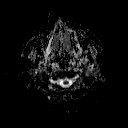
[im 10/48]
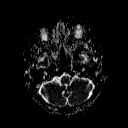
[im 19/48]
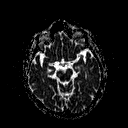
[im 29/48]
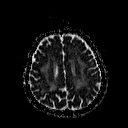
[im 38/48]
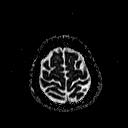
[im 48/48]
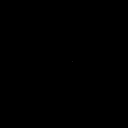

[Series 6: T2 · axial · 5.0mm · 0.45mm/px · z∈[+17,+158]mm · 3 of 23 slices shown]
[im 1/23]
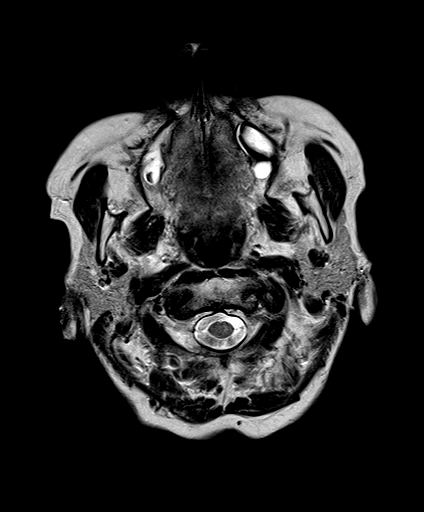
[im 12/23]
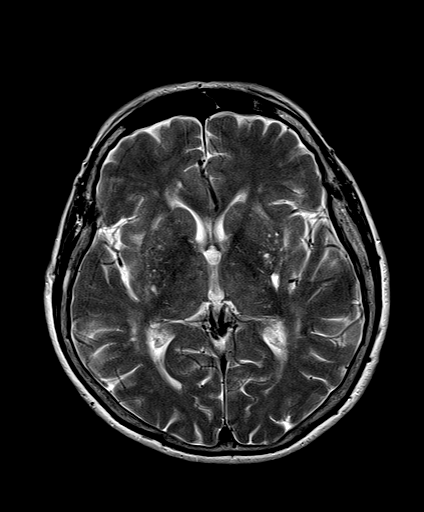
[im 23/23]
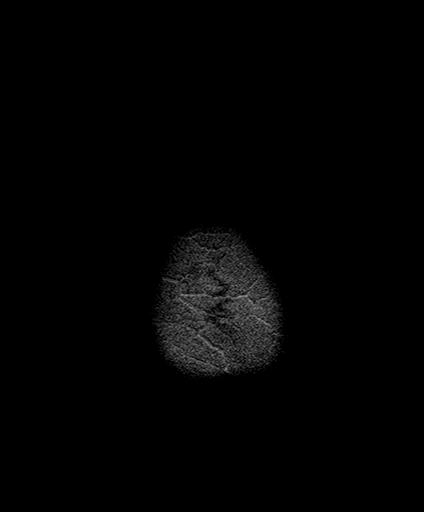

[Series 7: FLAIR · axial · 5.0mm · 0.45mm/px · z∈[+16,+158]mm · 3 of 23 slices shown]
[im 1/23]
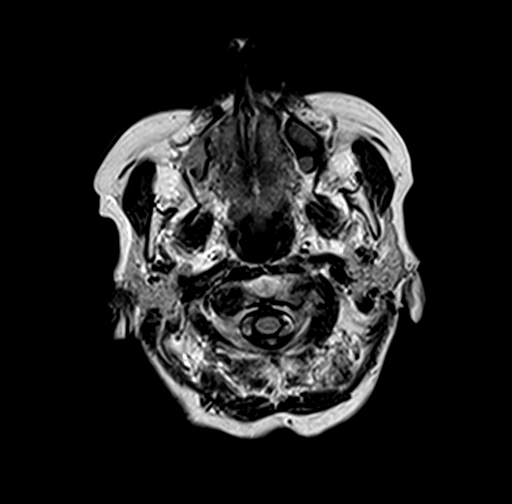
[im 12/23]
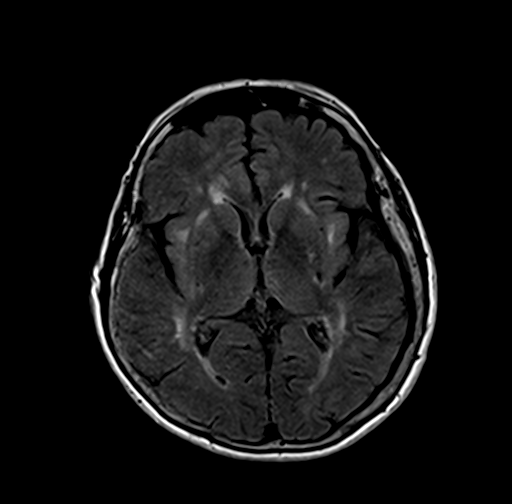
[im 23/23]
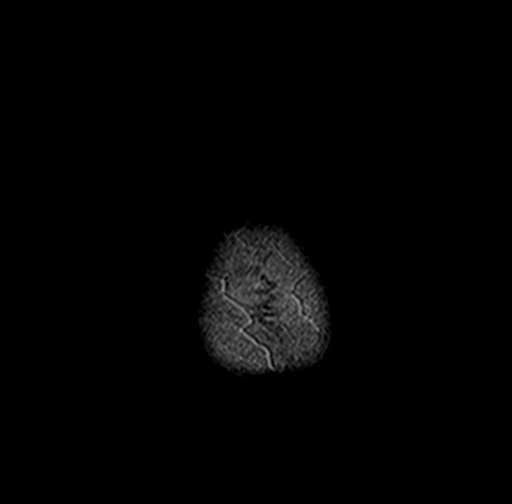

[Series 8: T1 · coronal · 3.0mm · 0.35mm/px · 1 of 11 slices shown (2 of 3)]
[im 1/11]
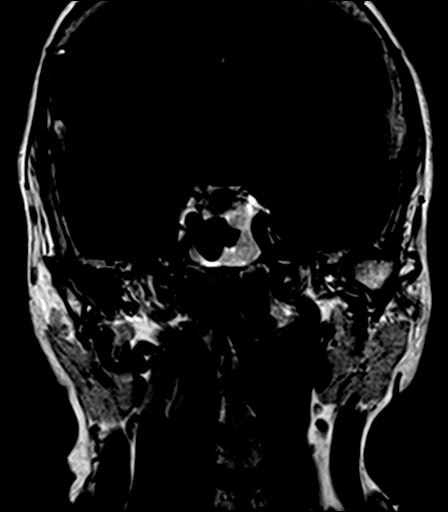

[Series 9: T1 · axial · 3.0mm · 0.35mm/px · 1 of 11 slices shown (3 of 3)]
[im 1/11]
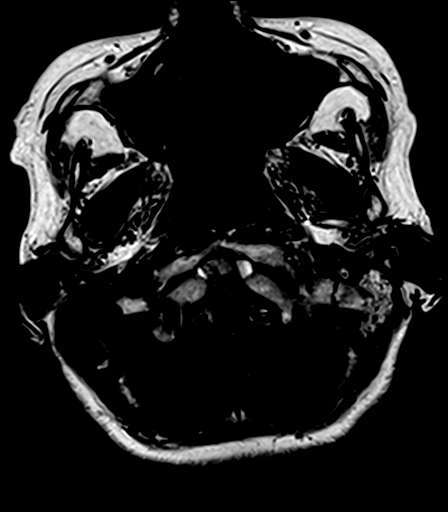

[Series 10: bSSFP · axial · 1.0mm · 0.30mm/px · z∈[+16,+42]mm · 4 of 36 slices shown]
[im 1/36]
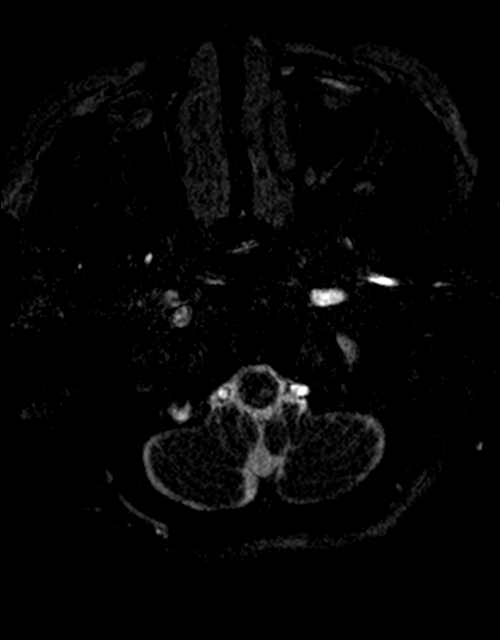
[im 9/36]
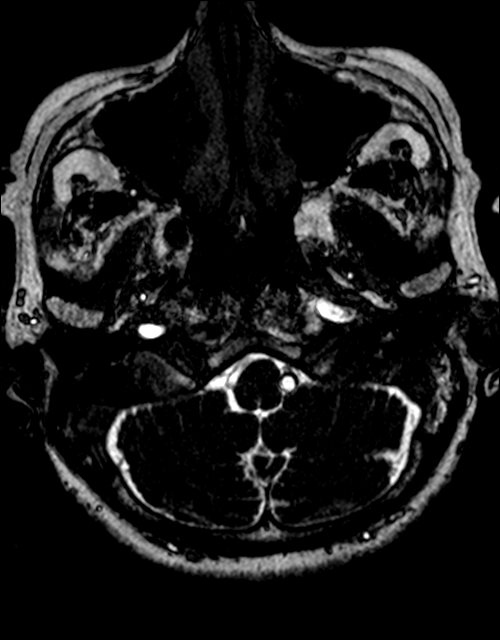
[im 18/36]
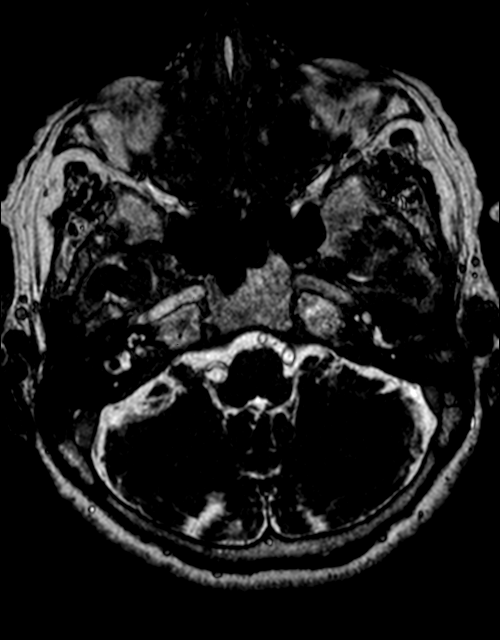
[im 27/36]
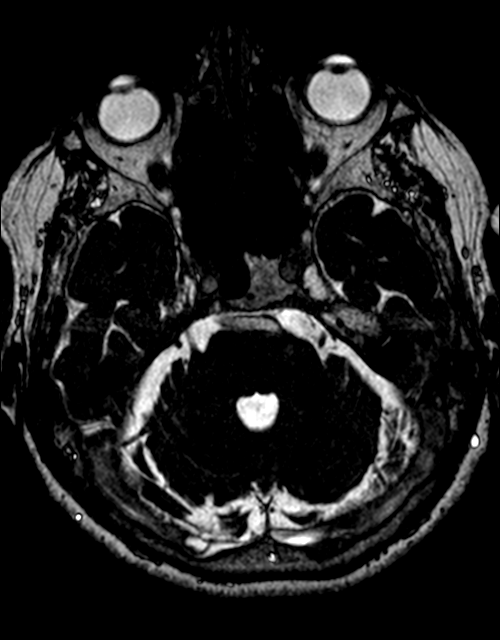

[Series 11: T1 post-contrast · coronal · 3.0mm · 0.35mm/px · 1 of 11 slices shown (1 of 2)]
[im 1/11]
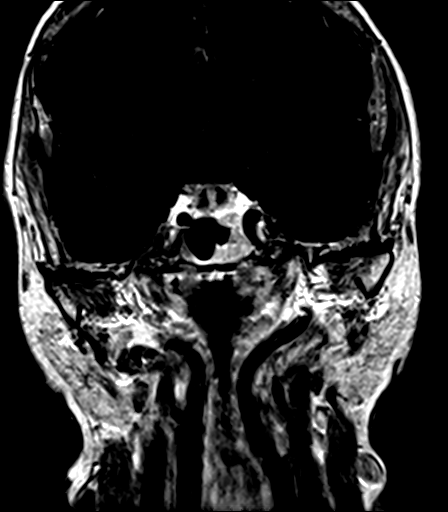

[Series 12: T1 post-contrast · axial · 3.0mm · 0.35mm/px · 1 of 11 slices shown (2 of 2)]
[im 1/11]
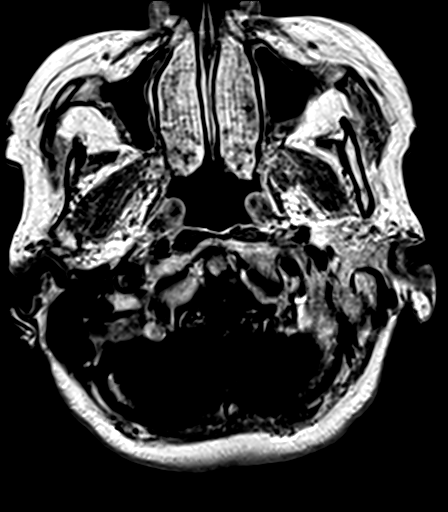

[32 of 48 positions shown; findings below may reference images not displayed]

FINDINGS: Brain: Diffusion imaging does not show any acute or subacute
infarction. There chronic small-vessel ischemic changes affecting
the pons. There are old cerebellar infarctions on the right.
Cerebral hemispheres show severe changes of chronic small vessel
disease throughout the deep and subcortical white matter. Chronic
small-vessel ischemic changes are also noted within the thalami and
basal ganglia. No large vessel territory infarction. No mass lesion,
hemorrhage, hydrocephalus or extra-axial collection.

CP angle regions are normal. Seventh and eighth nerve complexes are
normal. No vestibular schwannoma. There is dolichoectasia of the
vertebrobasilar system that indents the ventral pons slightly,
probably not significant.

No pituitary mass.  Empty sella.

Skull and upper cervical spine: No acute or significant finding.

Sinuses/Orbits: No sinus inflammatory disease. No fluid in the
middle ears or mastoids. Mastoid air cells on the left are
chronically poorly developed.

Other: None significant
IMPRESSION: No specific cause of the presenting symptoms is identified. Advanced
chronic small vessel ischemic changes throughout the brain as
outlined above. Old right cerebellar infarctions. No vestibular
schwannoma.

## 2017-01-22 ENCOUNTER — Encounter: Payer: Self-pay | Admitting: Family Medicine

## 2017-05-23 ENCOUNTER — Ambulatory Visit: Payer: Medicare Other | Admitting: Internal Medicine

## 2017-05-26 ENCOUNTER — Telehealth: Payer: Self-pay | Admitting: Family Medicine

## 2017-05-26 DIAGNOSIS — E1129 Type 2 diabetes mellitus with other diabetic kidney complication: Secondary | ICD-10-CM

## 2017-05-26 DIAGNOSIS — I10 Essential (primary) hypertension: Secondary | ICD-10-CM

## 2017-05-26 DIAGNOSIS — R809 Proteinuria, unspecified: Secondary | ICD-10-CM

## 2017-05-26 DIAGNOSIS — E7800 Pure hypercholesterolemia, unspecified: Secondary | ICD-10-CM

## 2017-05-26 DIAGNOSIS — E038 Other specified hypothyroidism: Secondary | ICD-10-CM

## 2017-05-26 MED ORDER — ATORVASTATIN CALCIUM 40 MG PO TABS: 40.0000 mg | ORAL_TABLET | Freq: Every day | ORAL | 0 refills | Status: DC

## 2017-05-26 MED ORDER — LEVOTHYROXINE SODIUM 25 MCG PO TABS: ORAL_TABLET | ORAL | 0 refills | Status: DC

## 2017-05-26 MED ORDER — CARVEDILOL 25 MG PO TABS: 25.0000 mg | ORAL_TABLET | Freq: Two times a day (BID) | ORAL | 0 refills | Status: DC

## 2017-05-26 MED ORDER — AMLODIPINE BESYLATE 10 MG PO TABS: 10.0000 mg | ORAL_TABLET | Freq: Every day | ORAL | 0 refills | Status: DC

## 2017-05-26 MED ORDER — LISINOPRIL 10 MG PO TABS: 10.0000 mg | ORAL_TABLET | Freq: Every day | ORAL | 0 refills | Status: DC

## 2017-05-26 NOTE — Telephone Encounter (Signed)
Refilled x 30 days. 

## 2017-05-26 NOTE — Telephone Encounter (Signed)
Pt came in to facility requesting a refill on her BP medication Thyroid and cholesterol medication. Pt has scheduled an appointment for 06/20/2017 to est care. Pt would like medication sent to [facility]. Please FU

## 2017-06-20 ENCOUNTER — Encounter: Payer: Self-pay | Admitting: Internal Medicine

## 2017-06-20 ENCOUNTER — Ambulatory Visit: Payer: Medicare Other | Attending: Internal Medicine | Admitting: Internal Medicine

## 2017-06-20 VITALS — BP 171/95 | HR 73 | Temp 98.2°F | Resp 16 | Wt 141.6 lb

## 2017-06-20 DIAGNOSIS — F329 Major depressive disorder, single episode, unspecified: Secondary | ICD-10-CM | POA: Insufficient documentation

## 2017-06-20 DIAGNOSIS — E1129 Type 2 diabetes mellitus with other diabetic kidney complication: Secondary | ICD-10-CM

## 2017-06-20 DIAGNOSIS — K219 Gastro-esophageal reflux disease without esophagitis: Secondary | ICD-10-CM | POA: Diagnosis not present

## 2017-06-20 DIAGNOSIS — Z23 Encounter for immunization: Secondary | ICD-10-CM

## 2017-06-20 DIAGNOSIS — E7800 Pure hypercholesterolemia, unspecified: Secondary | ICD-10-CM | POA: Diagnosis not present

## 2017-06-20 DIAGNOSIS — E038 Other specified hypothyroidism: Secondary | ICD-10-CM

## 2017-06-20 DIAGNOSIS — Z8673 Personal history of transient ischemic attack (TIA), and cerebral infarction without residual deficits: Secondary | ICD-10-CM | POA: Diagnosis not present

## 2017-06-20 DIAGNOSIS — R809 Proteinuria, unspecified: Secondary | ICD-10-CM

## 2017-06-20 DIAGNOSIS — R238 Other skin changes: Secondary | ICD-10-CM | POA: Diagnosis not present

## 2017-06-20 DIAGNOSIS — R233 Spontaneous ecchymoses: Secondary | ICD-10-CM

## 2017-06-20 DIAGNOSIS — E119 Type 2 diabetes mellitus without complications: Secondary | ICD-10-CM | POA: Insufficient documentation

## 2017-06-20 DIAGNOSIS — I2510 Atherosclerotic heart disease of native coronary artery without angina pectoris: Secondary | ICD-10-CM | POA: Diagnosis not present

## 2017-06-20 DIAGNOSIS — M170 Bilateral primary osteoarthritis of knee: Secondary | ICD-10-CM | POA: Diagnosis not present

## 2017-06-20 DIAGNOSIS — I10 Essential (primary) hypertension: Secondary | ICD-10-CM | POA: Diagnosis not present

## 2017-06-20 LAB — GLUCOSE, POCT (MANUAL RESULT ENTRY): POC Glucose: 94 mg/dl (ref 70–99)

## 2017-06-20 LAB — POCT GLYCOSYLATED HEMOGLOBIN (HGB A1C): Hemoglobin A1C: 6.6

## 2017-06-20 MED ORDER — RANITIDINE HCL 300 MG PO TABS: 300.0000 mg | ORAL_TABLET | Freq: Every day | ORAL | 3 refills | Status: DC

## 2017-06-20 MED ORDER — ACETAMINOPHEN ER 650 MG PO TBCR: 650.0000 mg | EXTENDED_RELEASE_TABLET | Freq: Three times a day (TID) | ORAL | 5 refills | Status: DC | PRN

## 2017-06-20 MED ORDER — LEVOTHYROXINE SODIUM 25 MCG PO TABS: ORAL_TABLET | ORAL | 11 refills | Status: DC

## 2017-06-20 MED ORDER — CLOPIDOGREL BISULFATE 75 MG PO TABS: 75.0000 mg | ORAL_TABLET | Freq: Every day | ORAL | 3 refills | Status: DC

## 2017-06-20 MED ORDER — CARVEDILOL 25 MG PO TABS: 25.0000 mg | ORAL_TABLET | Freq: Two times a day (BID) | ORAL | 11 refills | Status: DC

## 2017-06-20 MED ORDER — LISINOPRIL 10 MG PO TABS: 10.0000 mg | ORAL_TABLET | Freq: Every day | ORAL | 11 refills | Status: DC

## 2017-06-20 MED ORDER — NITROGLYCERIN 0.4 MG SL SUBL: 0.4000 mg | SUBLINGUAL_TABLET | SUBLINGUAL | 2 refills | Status: AC | PRN

## 2017-06-20 MED ORDER — AMLODIPINE BESYLATE 10 MG PO TABS: 5.0000 mg | ORAL_TABLET | Freq: Every day | ORAL | 11 refills | Status: DC

## 2017-06-20 MED ORDER — ATORVASTATIN CALCIUM 40 MG PO TABS: 40.0000 mg | ORAL_TABLET | Freq: Every day | ORAL | 11 refills | Status: DC

## 2017-06-20 NOTE — Patient Instructions (Addendum)
Stop Aspirin.  Take Carvedilol twice a day as prescribed.   Influenza Virus Vaccine injection (Fluarix) Qu es este medicamento? La VACUNA ANTIGRIPAL ayuda a disminuir el riesgo de contraer la influenza, tambin conocida como la gripe. La vacuna solo ayuda a protegerle contra algunas cepas de influenza. Esta vacuna no ayuda a reducir Catering manager de contraer influenza pandmica H1N1. Este medicamento puede ser utilizado para otros usos; si tiene alguna pregunta consulte con su proveedor de atencin mdica o con su farmacutico. MARCAS COMUNES: Fluarix, Fluzone Qu le debo informar a mi profesional de la salud antes de tomar este medicamento? Necesita saber si usted presenta alguno de los siguientes problemas o situaciones: -trastorno de sangrado como hemofilia -fiebre o infeccin -sndrome de Guillain-Barre u otros problemas neurolgicos -problemas del sistema inmunolgico -infeccin por el virus de la inmunodeficiencia humana (VIH) o SIDA -niveles bajos de plaquetas en la sangre -esclerosis mltiple -una Risk analyst o inusual a las vacunas antigripales, a los huevos, protenas de pollo, al ltex, a la gentamicina, a otros medicamentos, alimentos, colorantes o conservantes -si est embarazada o buscando quedar embarazada -si est amamantando a un beb Cmo debo utilizar este medicamento? Esta vacuna se administra mediante inyeccin por va intramuscular. Lo administra un profesional de [employer]	[place]. Recibir una copia de informacin escrita sobre la vacuna antes de cada vacuna. Asegrese de leer este folleto cada vez cuidadosamente. Este folleto puede cambiar con frecuencia. Hable con su pediatra para informarse acerca del uso de este medicamento en nios. Puede requerir atencin especial. Sobredosis: Pngase en contacto inmediatamente con un centro toxicolgico o una sala de urgencia si usted cree que haya tomado demasiado medicamento. ATENCIN: [employer] es solo para usted. No  comparta este medicamento con nadie. Qu sucede si me olvido de una dosis? No se aplica en este caso. Qu puede interactuar con este medicamento? -quimioterapia o radioterapia -medicamentos que suprimen el sistema inmunolgico, tales como etanercept, anakinra, infliximab y adalimumab -medicamentos que tratan o previenen cogulos sanguneos, como warfarina -fenitona -medicamentos esteroideos, como la prednisona o la cortisona -teofilina -vacunas Puede ser que esta lista no menciona todas las posibles interacciones. Informe a su profesional de [employer]	[place] de [employer] productos a base de hierbas, medicamentos de [place] o suplementos nutritivos que est tomando. Si usted fuma, consume bebidas alcohlicas o si utiliza drogas ilegales, indqueselo tambin a su profesional de [employer]	[place]. Algunas sustancias pueden interactuar con su medicamento. A qu debo estar atento al usar [employer]? Informe a su mdico o a Barrister's clerk de la [employer] todos los efectos secundarios que persistan despus de 3 das. Llame a su proveedor de atencin mdica si se presentan sntomas inusuales dentro de las 6 semanas posteriores a la vacunacin. Es posible que todava pueda contraer la gripe, pero la enfermedad no ser tan fuerte como normalmente. No puede contraer la gripe de esta vacuna. La vacuna antigripal no le protege contra resfros u otras enfermedades que pueden causar [place]. Debe vacunarse cada ao. Qu efectos secundarios puedo tener al [employer] este medicamento? Efectos secundarios que debe informar a su mdico o a Barrister's clerk de la salud tan pronto como sea posible: -Chief of Staff como erupcin cutnea, picazn o urticarias, hinchazn de la cara, labios o lengua Efectos secundarios que, por lo general, no requieren atencin mdica (debe informarlos a su mdico o a su profesional de la salud si persisten o si son molestos): -fiebre -dolor de cabeza -molestias y dolores  musculares -dolor, sensibilidad, enrojecimiento o Estate agent  de la inyeccin -cansancio o debilidad Puede ser que esta lista no menciona todos los posibles efectos secundarios. Comunquese a su mdico por asesoramiento mdico [employer] Usted puede informar los efectos secundarios a la FDA por telfono al 1-800-FDA-1088. Dnde debo guardar mi medicina? Esta vacuna se administra solamente en clnicas, farmacias, consultorio mdico u otro consultorio de un profesional de la salud y no Sports coach en su domicilio. ATENCIN: Este folleto es un resumen. Puede ser que no cubra toda la posible informacin. Si usted tiene preguntas acerca de esta medicina, consulte con su mdico, su farmacutico o su profesional de Technical sales engineer.  2018 Elsevier/Gold Standard (2010-02-27 15:31:40)

## 2017-06-20 NOTE — Progress Notes (Signed)
Patient ID: Wendy Petty, female    DOB: 12/27/1940  MRN: [id number]  CC: re-establish; Diabetes; and Hypertension   Subjective: Wendy Petty is a 76 y.o. female who presents for chronic ds management. Last saw Dr. Adrian Blackwater about 1 yr ago Her concerns today include:  Hx of HTN, DM, hypothyroid, GERD, coronary atherosclerosis, HL, lacunar CVA, OA back, hip and knees   1. DM: controlled by diet.   2. HTN/atherosclerosis: taking Coreg only once a day. Taking Norvasc 10 1/2 tab because full tab caused dizziness On Plavix and ASA for hx of CVA 6 yrs ago while living in [place] -compliant with Lipitor -limits salt in foods -occasional CP. No HA or dizzines  3.  Knee:  Pain in both knees Takes Glucosamine Chondrotin which helps Gabapentin does not help  Has noted easy brusing, no falls.   Patient Active Problem List   Diagnosis Date Noted  . Breast mass, right 11/23/2015  . 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 Ear noise/buzzing 10/30/2015  . Chronic low back pain 10/30/2015  . Type 2 diabetes mellitus with microalbuminuria ([place]) 09/29/2015  . Poorly fitting dentures 09/29/2015  . Hypothyroidism 09/29/2015  . Primary osteoarthritis of both knees 09/29/2015  . Spondylolisthesis 07/19/2015  . Overweight (BMI 25.0-29.9) 06/29/2015  . Memory deficit 11/28/2014  . Coronary atherosclerosis of native coronary artery 06/24/2013  . History of TIAs 06/24/2013  . GERD (gastroesophageal reflux disease) 06/24/2013  . Depression 12/31/2012  . THYROID NODULE 02/14/2009  . Elevated lipids 02/14/2009  . ANEMIA, CHRONIC 02/14/2009  . Essential hypertension 02/14/2009  . PULMONARY NODULE 02/14/2009     Current Outpatient Prescriptions on File Prior to Visit  Medication Sig Dispense Refill  . fluticasone (FLONASE) 50 MCG/ACT nasal spray Place 2 sprays into both nostrils daily. (Patient not taking: Reported on 07/26/2016) 16 g 0  . memantine (NAMENDA) 10 MG  tablet Take 1 tablet (10 mg total) by mouth daily. (Patient not taking: Reported on 06/20/2017) 90 tablet 3  . [DISCONTINUED] cetirizine (ZYRTEC) 10 MG tablet Take 10 mg by mouth daily.      . [DISCONTINUED] losartan-hydrochlorothiazide (HYZAAR) 100-25 MG per tablet Take 1 tablet by mouth daily.       No current facility-administered medications on file prior to visit.     No Known Allergies  Social History   Social History  . Marital status: Married    Spouse name: N/A  . Number of children: N/A  . Years of education: N/A   Occupational History  . Not on file.   Social History Main Topics  . Smoking status: Never Smoker  . Smokeless tobacco: Never Used  . Alcohol use No  . Drug use: No  . Sexual activity: No   Other Topics Concern  . Not on file   Social History Narrative   Lives with daughter in a 2 story home.  Does not work.     No family history on file.  Past Surgical History:  Procedure Laterality Date  . LEFT HEART CATHETERIZATION WITH CORONARY ANGIOGRAM N/A 09/16/2011   Procedure: LEFT HEART CATHETERIZATION WITH CORONARY ANGIOGRAM;  Surgeon: [name], MD;  Location: [facility] CATH LAB;  Service: Cardiovascular;  Laterality: N/A;  . TUBAL LIGATION    . VAGINAL HYSTERECTOMY  1989    ROS: Review of Systems  Constitutional: Negative for activity change and fatigue.  Eyes: Negative for visual disturbance.  Respiratory: Negative for shortness of breath.   Cardiovascular: Negative  for chest pain.  Psychiatric/Behavioral: The patient is not nervous/anxious.     PHYSICAL EXAM: BP (!) 171/95   Pulse 73   Temp 98.2 F (36.8 C) (Oral)   Resp 16   Wt 141 lb 9.6 oz (64.2 kg)   SpO2 98%   BMI 25.90 kg/m   Physical Exam General appearance - alert, well appearing, pleasant elderly female and in no distress. Pt is poor historian Mental status - alert, oriented to person, place, and time, normal mood, behavior, speech, dress, motor activity, and thought  processes Mouth - mucous membranes moist, pharynx normal without lesions Neck - supple, no significant adenopathy Chest - clear to auscultation, no wheezes, rales or rhonchi, symmetric air entry Heart - normal rate, regular rhythm, normal S1, S2, no murmurs, rubs, clicks or gallops Musculoskeletal - gait is steady and stable. No assistive device. Knees: enlarge. No point tenderness. Mild discomfort with passive ROM Extremities - peripheral pulses normal, no pedal edema, no clubbing or cyanosis Skin: resolving ecchymosis RT lateral lower thigh  Results for orders placed or performed in visit on 06/20/17  CBC  Result Value Ref Range   WBC 5.9 3.4 - 10.8 x10E3/uL   RBC 4.21 3.77 - 5.28 x10E6/uL   Hemoglobin 12.7 11.1 - 15.9 g/dL   Hematocrit 38.8 34.0 - 46.6 %   MCV 92 79 - 97 fL   MCH 30.2 26.6 - 33.0 pg   MCHC 32.7 31.5 - 35.7 g/dL   RDW 14.5 12.3 - 15.4 %   Platelets 214 150 - 379 x10E3/uL  Comprehensive metabolic panel  Result Value Ref Range   Glucose 118 (H) 65 - 99 mg/dL   BUN 24 8 - 27 mg/dL   Creatinine, Ser 1.07 (H) 0.57 - 1.00 mg/dL   GFR calc non Af Amer 51 (L) >59 mL/min/1.73   GFR calc Af Amer 59 (L) >59 mL/min/1.73   BUN/Creatinine Ratio 22 12 - 28   Sodium 145 (H) 134 - 144 mmol/L   Potassium 4.6 3.5 - 5.2 mmol/L   Chloride 105 96 - 106 mmol/L   CO2 25 20 - 29 mmol/L   Calcium 9.0 8.7 - 10.3 mg/dL   Total Protein 7.1 6.0 - 8.5 g/dL   Albumin 3.8 3.5 - 4.8 g/dL   Globulin, Total 3.3 1.5 - 4.5 g/dL   Albumin/Globulin Ratio 1.2 1.2 - 2.2   Bilirubin Total <0.2 0.0 - 1.2 mg/dL   Alkaline Phosphatase 63 39 - 117 IU/L   AST 17 0 - 40 IU/L   ALT 16 0 - 32 IU/L  Lipid panel  Result Value Ref Range   Cholesterol, Total 204 (H) 100 - 199 mg/dL   Triglycerides 217 (H) 0 - 149 mg/dL   HDL 68 >39 mg/dL   VLDL Cholesterol Cal 43 (H) 5 - 40 mg/dL   LDL Calculated 93 0 - 99 mg/dL   Chol/HDL Ratio 3.0 0.0 - 4.4 ratio  TSH  Result Value Ref Range   TSH 1.610 0.450 -  4.500 uIU/mL  PT AND PTT  Result Value Ref Range   INR 1.0 0.8 - 1.2   Prothrombin Time 9.9 9.1 - 12.0 sec   aPTT 24 24 - 33 sec  POCT glucose (manual entry)  Result Value Ref Range   POC Glucose 94 70 - 99 mg/dl  POCT glycosylated hemoglobin (Hb A1C)  Result Value Ref Range   Hemoglobin A1C 6.6     ASSESSMENT AND PLAN: 1. Type 2 diabetes mellitus with  microalbuminuria, without long-term current use of insulin ([place]) -controlled with diet. Encourage aerobic exercise as toelrated - POCT glucose (manual entry) - POCT glycosylated hemoglobin (Hb A1C) - lisinopril (PRINIVIL,ZESTRIL) 10 MG tablet; Take 1 tablet (10 mg total) by mouth daily.  Dispense: 30 tablet; Refill: 11 - CBC - Comprehensive metabolic panel - Lipid panel  2. Primary osteoarthritis of both knees Ok to use Glucosamine if she finds it helpful.  Encourage exercise as tolerated - acetaminophen (TYLENOL 8 HOUR) 650 MG CR tablet; Take 1 tablet (650 mg total) by mouth every 8 (eight) hours as needed for pain.  Dispense: 90 tablet; Refill: 5  3. Essential hypertension - lisinopril (PRINIVIL,ZESTRIL) 10 MG tablet; Take 1 tablet (10 mg total) by mouth daily.  Dispense: 30 tablet; Refill: 11 - amLODipine (NORVASC) 10 MG tablet; Take 0.5 tablets (5 mg total) by mouth daily.  Dispense: 30 tablet; Refill: 11 - carvedilol (COREG) 25 MG tablet; Take 1 tablet (25 mg total) by mouth 2 (two) times daily with a meal.  Dispense: 60 tablet; Refill: 11  4. Other specified hypothyroidism - levothyroxine (SYNTHROID) 25 MCG tablet; TAKE 1 TABLET BY MOUTH ONCE DAILY  Dispense: 30 tablet; Refill: 11 - TSH  5. Gastroesophageal reflux disease, esophagitis presence not specified - ranitidine (ZANTAC) 300 MG tablet; Take 1 tablet (300 mg total) by mouth at bedtime.  Dispense: 90 tablet; Refill: 3  6. Pure hypercholesterolemia - atorvastatin (LIPITOR) 40 MG tablet; Take 1 tablet (40 mg total) by mouth daily.  Dispense: 30 tablet; Refill:  11  7. Atherosclerosis of native coronary artery of native heart without angina pectoris - clopidogrel (PLAVIX) 75 MG tablet; Take 1 tablet (75 mg total) by mouth daily.  Dispense: 90 tablet; Refill: 3 - nitroGLYCERIN (NITROSTAT) 0.4 MG SL tablet; Place 1 tablet (0.4 mg total) under the tongue every 5 (five) minutes as needed for chest pain.  Dispense: 15 tablet; Refill: 2  8. Easy bruising - PT AND PTT Pt on ASA and Plavix for secondary prevent of CVA. This combo can increase risk for bleed without added benefit for 2nd prevention. I recommend stopping ASA. Continue Plavix.  9. Need for influenza vaccination - Flu Vaccine QUAD 6+ mos PF IM (Fluarix Quad PF)   Patient was given the opportunity to ask questions.  Patient verbalized understanding of the plan and was able to repeat key elements of the plan.   Orders Placed This Encounter  Procedures  . Flu Vaccine QUAD 6+ mos PF IM (Fluarix Quad PF)  . CBC  . Comprehensive metabolic panel  . Lipid panel  . TSH  . PT AND PTT  . POCT glucose (manual entry)  . POCT glycosylated hemoglobin (Hb A1C)     Requested Prescriptions   Signed Prescriptions Disp Refills  . lisinopril (PRINIVIL,ZESTRIL) 10 MG tablet 30 tablet 11    Sig: Take 1 tablet (10 mg total) by mouth daily.  Marland Kitchen levothyroxine (SYNTHROID) 25 MCG tablet 30 tablet 11    Sig: TAKE 1 TABLET BY MOUTH ONCE DAILY  . amLODipine (NORVASC) 10 MG tablet 30 tablet 11    Sig: Take 0.5 tablets (5 mg total) by mouth daily.  . carvedilol (COREG) 25 MG tablet 60 tablet 11    Sig: Take 1 tablet (25 mg total) by mouth 2 (two) times daily with a meal.  . ranitidine (ZANTAC) 300 MG tablet 90 tablet 3    Sig: Take 1 tablet (300 mg total) by mouth at bedtime.  Marland Kitchen atorvastatin (LIPITOR)  40 MG tablet 30 tablet 11    Sig: Take 1 tablet (40 mg total) by mouth daily.  . clopidogrel (PLAVIX) 75 MG tablet 90 tablet 3    Sig: Take 1 tablet (75 mg total) by mouth daily.  . nitroGLYCERIN (NITROSTAT)  0.4 MG SL tablet 15 tablet 2    Sig: Place 1 tablet (0.4 mg total) under the tongue every 5 (five) minutes as needed for chest pain.  Marland Kitchen acetaminophen (TYLENOL 8 HOUR) 650 MG CR tablet 90 tablet 5    Sig: Take 1 tablet (650 mg total) by mouth every 8 (eight) hours as needed for pain.    Return in about 6 weeks (around 08/01/2017) for BP recheck.  Karle Plumber, MD, FACP

## 2017-06-22 LAB — COMPREHENSIVE METABOLIC PANEL
ALT: 16 IU/L (ref 0–32)
AST: 17 IU/L (ref 0–40)
Albumin/Globulin Ratio: 1.2 (ref 1.2–2.2)
Albumin: 3.8 g/dL (ref 3.5–4.8)
Alkaline Phosphatase: 63 IU/L (ref 39–117)
BUN/Creatinine Ratio: 22 (ref 12–28)
BUN: 24 mg/dL (ref 8–27)
CHLORIDE: 105 mmol/L (ref 96–106)
CO2: 25 mmol/L (ref 20–29)
CREATININE: 1.07 mg/dL — AB (ref 0.57–1.00)
Calcium: 9 mg/dL (ref 8.7–10.3)
GFR calc non Af Amer: 51 mL/min/{1.73_m2} — ABNORMAL LOW (ref >59)
GFR, EST AFRICAN AMERICAN: 59 mL/min/{1.73_m2} — AB (ref >59)
GLUCOSE: 118 mg/dL — AB (ref 65–99)
Globulin, Total: 3.3 g/dL (ref 1.5–4.5)
Potassium: 4.6 mmol/L (ref 3.5–5.2)
Sodium: 145 mmol/L — ABNORMAL HIGH (ref 134–144)
TOTAL PROTEIN: 7.1 g/dL (ref 6.0–8.5)

## 2017-06-22 LAB — CBC
Hematocrit: 38.8 % (ref 34.0–46.6)
Hemoglobin: 12.7 g/dL (ref 11.1–15.9)
MCH: 30.2 pg (ref 26.6–33.0)
MCHC: 32.7 g/dL (ref 31.5–35.7)
MCV: 92 fL (ref 79–97)
PLATELETS: 214 10*3/uL (ref 150–379)
RBC: 4.21 x10E6/uL (ref 3.77–5.28)
RDW: 14.5 % (ref 12.3–15.4)
WBC: 5.9 10*3/uL (ref 3.4–10.8)

## 2017-06-22 LAB — LIPID PANEL
CHOLESTEROL TOTAL: 204 mg/dL — AB (ref 100–199)
Chol/HDL Ratio: 3 ratio (ref 0.0–4.4)
HDL: 68 mg/dL (ref >39)
LDL CALC: 93 mg/dL (ref 0–99)
Triglycerides: 217 mg/dL — ABNORMAL HIGH (ref 0–149)
VLDL CHOLESTEROL CAL: 43 mg/dL — AB (ref 5–40)

## 2017-06-22 LAB — PT AND PTT
APTT: 24 s (ref 24–33)
INR: 1 (ref 0.8–1.2)
Prothrombin Time: 9.9 s (ref 9.1–12.0)

## 2017-06-22 LAB — TSH: TSH: 1.61 u[IU]/mL (ref 0.450–4.500)

## 2017-07-02 ENCOUNTER — Telehealth: Payer: Self-pay | Admitting: Internal Medicine

## 2017-07-02 NOTE — Telephone Encounter (Signed)
Contacted pt with a spanish interpreter Hart Carwin ID# [phone] pt didn't answer and vm was full was not able to lvm

## 2017-07-02 NOTE — Telephone Encounter (Signed)
If pt calls back or comes by please give results:Blood count is normal. Total cholesterol is elevated make sure to take the atorvastatin everyday as prescribed. You kidney function is not 100%. Avoid taking Mortin, Ibuprofen, Aleve, Advil and Naproxen as these can make the kidney worse.

## 2017-07-02 NOTE — Telephone Encounter (Signed)
Patient came by the office today requesting to speak to nurse to go over results, pt does not understand english and she received a english copy of results. Pt has questions on it, Please f/up with spanish interpreter if possible

## 2017-08-13 NOTE — Progress Notes (Deleted)
Patient ID: Wendy Petty, female   DOB: 1941/08/06, 75 y.o.   MRN: [id number]   Wendy Petty is a 76 y.o. [name] female who returns for history of chest pain and CAD  She is a history of CAD treated medically, HTN, HL, hypothyroidism, prior TIA, depression. Echo (09/16/07): EF 55%, mild LVH. Myoview (8/10): Normal study, EF 66%. ETT-echo (1/13): EF 55%, inferior HK with stress (ischemia could not be ruled out). LHC (09/12/11): Mid LAD 40, ostial D2 90 (small caliber), distal D3 20-30, distal RCA 40-50, mid PDA 30, EF 55-60%. Medical therapy recommended.   F/U myovue 12/06/15  normal EF 68%  Echo EF 60-65% no valve disease    ROS: Denies fever, malais, weight loss, blurry vision, decreased visual acuity, cough, sputum, SOB, hemoptysis, pleuritic pain, palpitaitons, heartburn, abdominal pain, melena, lower extremity edema, claudication, or rash.  All other systems reviewed and negative  General: There were no vitals taken for this visit. Affect appropriate Healthy:  appears stated age 7: normal Neck supple with no adenopathy JVP normal no bruits no thyromegaly Lungs clear with no wheezing and good diaphragmatic motion Heart:  S1/S2 no murmur, no rub, gallop or click PMI normal Abdomen: benighn, BS positve, no tenderness, no AAA no bruit.  No HSM or HJR Distal pulses intact with no bruits No edema Neuro non-focal Skin warm and dry No muscular weakness    Current Outpatient Medications  Medication Sig Dispense Refill  . acetaminophen (TYLENOL 8 HOUR) 650 MG CR tablet Take 1 tablet (650 mg total) by mouth every 8 (eight) hours as needed for pain. 90 tablet 5  . amLODipine (NORVASC) 10 MG tablet Take 0.5 tablets (5 mg total) by mouth daily. 30 tablet 11  . atorvastatin (LIPITOR) 40 MG tablet Take 1 tablet (40 mg total) by mouth daily. 30 tablet 11  . carvedilol (COREG) 25 MG tablet Take 1 tablet (25 mg total) by mouth 2 (two) times daily with a meal. 60 tablet 11  .  clopidogrel (PLAVIX) 75 MG tablet Take 1 tablet (75 mg total) by mouth daily. 90 tablet 3  . fluticasone (FLONASE) 50 MCG/ACT nasal spray Place 2 sprays into both nostrils daily. (Patient not taking: Reported on 07/26/2016) 16 g 0  . levothyroxine (SYNTHROID) 25 MCG tablet TAKE 1 TABLET BY MOUTH ONCE DAILY 30 tablet 11  . lisinopril (PRINIVIL,ZESTRIL) 10 MG tablet Take 1 tablet (10 mg total) by mouth daily. 30 tablet 11  . memantine (NAMENDA) 10 MG tablet Take 1 tablet (10 mg total) by mouth daily. (Patient not taking: Reported on 06/20/2017) 90 tablet 3  . nitroGLYCERIN (NITROSTAT) 0.4 MG SL tablet Place 1 tablet (0.4 mg total) under the tongue every 5 (five) minutes as needed for chest pain. 15 tablet 2  . ranitidine (ZANTAC) 300 MG tablet Take 1 tablet (300 mg total) by mouth at bedtime. 90 tablet 3   No current facility-administered medications for this visit.     Allergies  Patient has no known allergies.  Electrocardiogram:   04/28/14   NSR  Normal ECG  12/01/15  ST rate 110  RAE inferior T wave changes   Assessment and Plan CAD:  Cath 2013 with small ostial D2 disease. Normal myovue 12/06/15 medical Rx  Dementia:  On namenda and aricept no bradycardia  f/u with neurology  HTN:  Continue ARB/diuretic  And calcium blocker  Thyroid:  On replacement given tachycardia check TSH, Hct and BMET  GERD:  Discussed low carb diet continue prilosec  Tachycardia:  Improved on coreg echo 11/2016 with normal EF 60-65% TSH/Hct normal 06/20/17  Jenkins Rouge

## 2017-08-14 ENCOUNTER — Encounter: Payer: Self-pay | Admitting: Internal Medicine

## 2017-08-14 ENCOUNTER — Ambulatory Visit: Payer: Medicare Other | Attending: Internal Medicine | Admitting: Internal Medicine

## 2017-08-14 VITALS — BP 157/77 | HR 83 | Temp 97.7°F | Resp 18 | Ht 59.84 in | Wt 145.0 lb

## 2017-08-14 DIAGNOSIS — Z79899 Other long term (current) drug therapy: Secondary | ICD-10-CM | POA: Insufficient documentation

## 2017-08-14 DIAGNOSIS — R413 Other amnesia: Secondary | ICD-10-CM

## 2017-08-14 DIAGNOSIS — N183 Chronic kidney disease, stage 3 unspecified: Secondary | ICD-10-CM

## 2017-08-14 DIAGNOSIS — I129 Hypertensive chronic kidney disease with stage 1 through stage 4 chronic kidney disease, or unspecified chronic kidney disease: Secondary | ICD-10-CM | POA: Insufficient documentation

## 2017-08-14 DIAGNOSIS — Z90710 Acquired absence of both cervix and uterus: Secondary | ICD-10-CM | POA: Diagnosis not present

## 2017-08-14 DIAGNOSIS — I10 Essential (primary) hypertension: Secondary | ICD-10-CM

## 2017-08-14 DIAGNOSIS — Z9889 Other specified postprocedural states: Secondary | ICD-10-CM | POA: Diagnosis not present

## 2017-08-14 DIAGNOSIS — Z9851 Tubal ligation status: Secondary | ICD-10-CM | POA: Diagnosis not present

## 2017-08-14 DIAGNOSIS — R809 Proteinuria, unspecified: Secondary | ICD-10-CM | POA: Diagnosis not present

## 2017-08-14 DIAGNOSIS — Z8673 Personal history of transient ischemic attack (TIA), and cerebral infarction without residual deficits: Secondary | ICD-10-CM | POA: Insufficient documentation

## 2017-08-14 DIAGNOSIS — E1122 Type 2 diabetes mellitus with diabetic chronic kidney disease: Secondary | ICD-10-CM | POA: Diagnosis not present

## 2017-08-14 DIAGNOSIS — J302 Other seasonal allergic rhinitis: Secondary | ICD-10-CM

## 2017-08-14 DIAGNOSIS — R42 Dizziness and giddiness: Secondary | ICD-10-CM | POA: Diagnosis present

## 2017-08-14 DIAGNOSIS — E1129 Type 2 diabetes mellitus with other diabetic kidney complication: Secondary | ICD-10-CM

## 2017-08-14 LAB — GLUCOSE, POCT (MANUAL RESULT ENTRY): POC Glucose: 139 mg/dl — AB (ref 70–99)

## 2017-08-14 MED ORDER — LORATADINE 10 MG PO TABS: 10.0000 mg | ORAL_TABLET | Freq: Every day | ORAL | 2 refills | Status: DC | PRN

## 2017-08-14 MED ORDER — HYDROCHLOROTHIAZIDE 12.5 MG PO TABS: 25.0000 mg | ORAL_TABLET | Freq: Every day | ORAL | 5 refills | Status: DC

## 2017-08-14 MED ORDER — MEMANTINE HCL 10 MG PO TABS: 10.0000 mg | ORAL_TABLET | Freq: Every day | ORAL | 3 refills | Status: DC

## 2017-08-14 NOTE — Progress Notes (Signed)
Patient ID: Wendy Petty, female    DOB: 1941-05-05  MRN: [id number]  CC: Hypertension (Patient is here for hypertension and dizziness. Patient stated she get dizzy almost all the day. Patient would like medication refills and would like medication for her allergies. Patient stated that she ran out her blood pressure medication.)   Subjective: Wendy Petty is a 76 y.o. female who presents for 1.5  mth f/u BP Her concerns today include:  Hx of HTN, DM, hypothyroid, GERD, coronary atherosclerosis, HL, lacunar CVA, OA back, hip and knees   1. HTN:  -no device at home to check. She limits salt in foods -on last visit, BP was elevated. We RF meds. Kept Norvasc 10 mg at 1/2 tab because full tab causes dizziness. Still taking Coreg once a day instead of BID. She forgot that we discussed taking it BID.  + LE edema. Some SOB "when I get agitated or doing some cleaning." Some back pain too when she gets agitated.  No PND or orthopnea -out of Lisinopril x 3 days. She has Rf on bottle and plans to get fill today -also request RF on Lipitor. She has RF on bottle  2. C/o of "allergies every morning.  I have water coming out of my nose." -endorses sneezing, itchy throat and eyes.  -she was on an allergy pill in past.   3. I went over results of blood test done on last visit  4. Requesting RF on Namenda which she states the neurologist had placed her on to help with memory Patient Active Problem List   Diagnosis Date Noted  . Breast mass, right 11/23/2015  . 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 Ear noise/buzzing 10/30/2015  . Chronic low back pain 10/30/2015  . Type 2 diabetes mellitus with microalbuminuria ([place]) 09/29/2015  . Poorly fitting dentures 09/29/2015  . Hypothyroidism 09/29/2015  . Primary osteoarthritis of both knees 09/29/2015  . Spondylolisthesis 07/19/2015  . Overweight (BMI 25.0-29.9) 06/29/2015  . Memory deficit 11/28/2014  . Coronary  atherosclerosis of native coronary artery 06/24/2013  . History of TIAs 06/24/2013  . GERD (gastroesophageal reflux disease) 06/24/2013  . Depression 12/31/2012  . THYROID NODULE 02/14/2009  . Elevated lipids 02/14/2009  . ANEMIA, CHRONIC 02/14/2009  . Essential hypertension 02/14/2009  . PULMONARY NODULE 02/14/2009     Current Outpatient Medications on File Prior to Visit  Medication Sig Dispense Refill  . acetaminophen (TYLENOL 8 HOUR) 650 MG CR tablet Take 1 tablet (650 mg total) by mouth every 8 (eight) hours as needed for pain. 90 tablet 5  . atorvastatin (LIPITOR) 40 MG tablet Take 1 tablet (40 mg total) by mouth daily. 30 tablet 11  . carvedilol (COREG) 25 MG tablet Take 1 tablet (25 mg total) by mouth 2 (two) times daily with a meal. 60 tablet 11  . clopidogrel (PLAVIX) 75 MG tablet Take 1 tablet (75 mg total) by mouth daily. 90 tablet 3  . levothyroxine (SYNTHROID) 25 MCG tablet TAKE 1 TABLET BY MOUTH ONCE DAILY 30 tablet 11  . lisinopril (PRINIVIL,ZESTRIL) 10 MG tablet Take 1 tablet (10 mg total) by mouth daily. 30 tablet 11  . ranitidine (ZANTAC) 300 MG tablet Take 1 tablet (300 mg total) by mouth at bedtime. 90 tablet 3  . amLODipine (NORVASC) 10 MG tablet Take 0.5 tablets (5 mg total) by mouth daily. (Patient not taking: Reported on 08/14/2017) 30 tablet 11  . fluticasone (FLONASE) 50 MCG/ACT nasal spray Place 2  sprays into both nostrils daily. (Patient not taking: Reported on 07/26/2016) 16 g 0  . memantine (NAMENDA) 10 MG tablet Take 1 tablet (10 mg total) by mouth daily. (Patient not taking: Reported on 06/20/2017) 90 tablet 3  . nitroGLYCERIN (NITROSTAT) 0.4 MG SL tablet Place 1 tablet (0.4 mg total) under the tongue every 5 (five) minutes as needed for chest pain. (Patient not taking: Reported on 08/14/2017) 15 tablet 2  . [DISCONTINUED] cetirizine (ZYRTEC) 10 MG tablet Take 10 mg by mouth daily.      . [DISCONTINUED] losartan-hydrochlorothiazide (HYZAAR) 100-25 MG per tablet  Take 1 tablet by mouth daily.       No current facility-administered medications on file prior to visit.     No Known Allergies  Social History   Socioeconomic History  . Marital status: Married    Spouse name: Not on file  . Number of children: Not on file  . Years of education: Not on file  . Highest education level: Not on file  Social Needs  . Financial resource strain: Not on file  . Food insecurity - worry: Not on file  . Food insecurity - inability: Not on file  . Transportation needs - medical: Not on file  . Transportation needs - non-medical: Not on file  Occupational History  . Not on file  Tobacco Use  . Smoking status: Never Smoker  . Smokeless tobacco: Never Used  Substance and Sexual Activity  . Alcohol use: No    Alcohol/week: 0.0 oz  . Drug use: No  . Sexual activity: No  Other Topics Concern  . Not on file  Social History Narrative   Lives with daughter in a 2 story home.  Does not work.     History reviewed. No pertinent family history.  Past Surgical History:  Procedure Laterality Date  . LEFT HEART CATHETERIZATION WITH CORONARY ANGIOGRAM N/A 09/16/2011   Procedure: LEFT HEART CATHETERIZATION WITH CORONARY ANGIOGRAM;  Surgeon: [name], MD;  Location: [facility] CATH LAB;  Service: Cardiovascular;  Laterality: N/A;  . TUBAL LIGATION    . VAGINAL HYSTERECTOMY  1989    ROS: Review of Systems Neg except as above PHYSICAL EXAM: BP (!) 157/77 (BP Location: Left Arm, Patient Position: Sitting, Cuff Size: Normal)   Pulse 83   Temp 97.7 F (36.5 C) (Oral)   Resp 18   Ht 4' 11.84" (1.52 m)   Wt 145 lb (65.8 kg)   SpO2 98%   BMI 28.47 kg/m   Physical Exam Repeat BP 170/85 General appearance - alert, well appearing, elderly female and in no distress Mental status - very talkative. Oriented to person and place Ears - bilateral TM's and external ear canals normal Nose - normal and patent, no erythema, discharge or polyps Mouth - mucous membranes  moist, pharynx normal without lesions Neck - supple, no significant adenopathy Chest - clear to auscultation, no wheezes, rales or rhonchi, symmetric air entry Heart - normal rate, regular rhythm, normal S1, S2, no murmurs, rubs, clicks or gallops Ext: 1 + LE edema Lab Results  Component Value Date   WBC 5.9 06/20/2017   HGB 12.7 06/20/2017   HCT 38.8 06/20/2017   MCV 92 06/20/2017   PLT 214 06/20/2017     Chemistry      Component Value Date/Time   NA 145 (H) 06/20/2017 1648   K 4.6 06/20/2017 1648   CL 105 06/20/2017 1648   CO2 25 06/20/2017 1648   BUN 24 06/20/2017 1648  CREATININE 1.07 (H) 06/20/2017 1648   CREATININE 0.87 05/21/2016 1236      Component Value Date/Time   CALCIUM 9.0 06/20/2017 1648   ALKPHOS 63 06/20/2017 1648   AST 17 06/20/2017 1648   ALT 16 06/20/2017 1648   BILITOT <0.2 06/20/2017 1648     Lab Results  Component Value Date   CHOL 204 (H) 06/20/2017   HDL 68 06/20/2017   LDLCALC 93 06/20/2017   TRIG 217 (H) 06/20/2017   CHOLHDL 3.0 06/20/2017   Results for orders placed or performed in visit on 08/14/17  Glucose (CBG)  Result Value Ref Range   POC Glucose 139 (A) 70 - 99 mg/dl    ASSESSMENT AND PLAN: 1. Essential hypertension -not at goal Advise pt to take the Coreg BID Stop Norvasc due to LE edema and c/o dizziness when she takes it Start low dose HCTZ instead - hydrochlorothiazide (HYDRODIURIL) 12.5 MG tablet; Take 2 tablets (25 mg total) by mouth daily. Stop the Norvasc  Dispense: 30 tablet; Refill: 5  2. Seasonal allergies - loratadine (CLARITIN) 10 MG tablet; Take 1 tablet (10 mg total) by mouth daily as needed for allergies.  Dispense: 30 tablet; Refill: 2  3. Type 2 diabetes mellitus with microalbuminuria, without long-term current use of insulin (HCC) Not address today - Glucose (CBG)  4. CKD (chronic kidney disease) stage 3, GFR 30-59 ml/min (HCC) -avoid NSAIDs which she states she does  5. Memory deficit - memantine  (NAMENDA) 10 MG tablet; Take 1 tablet (10 mg total) by mouth daily.  Dispense: 90 tablet; Refill: 3   Patient was given the opportunity to ask questions.  Patient verbalized understanding of the plan and was able to repeat key elements of the plan.  [employer] interpreter used during this encounter.  Orders Placed This Encounter  Procedures  . Glucose (CBG)     Requested Prescriptions    No prescriptions requested or ordered in this encounter    F/u in 2 wks for BP recheck Karle Plumber, MD, Rosalita Chessman

## 2017-08-15 ENCOUNTER — Ambulatory Visit: Payer: Medicare Other | Admitting: Cardiovascular Disease

## 2017-08-26 ENCOUNTER — Encounter: Payer: Self-pay | Admitting: Cardiology

## 2017-08-26 ENCOUNTER — Ambulatory Visit (INDEPENDENT_AMBULATORY_CARE_PROVIDER_SITE_OTHER): Payer: Medicare Other | Admitting: Cardiology

## 2017-08-26 VITALS — BP 150/82 | HR 77 | Resp 16 | Ht 59.0 in | Wt 144.1 lb

## 2017-08-26 DIAGNOSIS — R809 Proteinuria, unspecified: Secondary | ICD-10-CM | POA: Diagnosis not present

## 2017-08-26 DIAGNOSIS — I2510 Atherosclerotic heart disease of native coronary artery without angina pectoris: Secondary | ICD-10-CM | POA: Diagnosis not present

## 2017-08-26 DIAGNOSIS — I10 Essential (primary) hypertension: Secondary | ICD-10-CM | POA: Diagnosis not present

## 2017-08-26 DIAGNOSIS — E785 Hyperlipidemia, unspecified: Secondary | ICD-10-CM

## 2017-08-26 DIAGNOSIS — E1129 Type 2 diabetes mellitus with other diabetic kidney complication: Secondary | ICD-10-CM

## 2017-08-26 DIAGNOSIS — F0390 Unspecified dementia without behavioral disturbance: Secondary | ICD-10-CM

## 2017-08-26 MED ORDER — LISINOPRIL 10 MG PO TABS: 20.0000 mg | ORAL_TABLET | Freq: Every day | ORAL | 6 refills | Status: DC

## 2017-08-26 NOTE — Patient Instructions (Addendum)
Medication Instructions:   START TAKING LISINOPRIL 20 MG  ( TWO TABLETS 10 MG  IN THE AM )  If you need a refill on your cardiac medications before your next appointment, please call your pharmacy.  Labwork: CMP TODAY     Testing/Procedures: NONE ORDERED  TODAY    Follow-Up:  Your physician wants you to follow-up in:  [place] will receive a reminder letter in the mail two months in advance. If you don't receive a letter, please call our office to schedule the follow-up appointment.      Any Other Special Instructions Will Be Listed Below (If Applicable).

## 2017-08-26 NOTE — Progress Notes (Signed)
Cardiology Office Note   Date:  08/26/2017   ID:  Wendy Petty, DOB 1941-02-09, MRN [id number]  PCP:  [name], MD  Cardiologist:  [name]    Chief Complaint  Patient presents with  . Hypertension      History of Present Illness: Wendy Petty is a 76 y.o. female who presents for HTN and CAD.  Wendy Petty female who returns for history of chest pain and CAD with CAD treated medically, HTN, HL, hypothyroidism, prior TIA, depression.   Echo (09/16/07): EF 55%, mild LVH. Myoview (8/10): Normal study, EF 66%. ETT-echo (1/13): EF 55%, inferior HK with stress (ischemia could not be ruled out). LHC (09/12/11): Mid LAD 40, ostial D2 90 (small caliber), distal D3 20-30, distal RCA 40-50, mid PDA 30, EF 55-60%. Medical therapy recommended.   F/U myovue 07/14/13 normal EF 68%   Last seen by [name] 12/01/15   Today she has occ chest pain that lasts a second and is gone, and even that does not occur very often.  She has rare SOB.  She was having edema of legs but her amlodipine was stopped and HCTZ was added.   The edema has improved but her BP is still elevated.  She has arthritic pain in her legs.  otherwixe she has been doing well.  We have an interpreter today.   Past Medical History:  Diagnosis Date  . ANEMIA, CHRONIC   . CHEST PAIN UNSPECIFIED   . Diabetes mellitus without complication ([place]) 40/9735  . Edema   . GERD (gastroesophageal reflux disease)   . Hx of cardiovascular stress test    Lexiscan Myoview (11/14):  No ischemia, EF 68% (normal study)  . HYPERLIPIDEMIA   . Hypertension   . PULMONARY NODULE   . THYROID NODULE     Past Surgical History:  Procedure Laterality Date  . LEFT HEART CATHETERIZATION WITH CORONARY ANGIOGRAM N/A 09/16/2011   Procedure: LEFT HEART CATHETERIZATION WITH CORONARY ANGIOGRAM;  Surgeon: [name], MD;  Location: [facility] CATH LAB;  Service: Cardiovascular;  Laterality: N/A;  . TUBAL LIGATION    . VAGINAL HYSTERECTOMY   1989     Current Outpatient Medications  Medication Sig Dispense Refill  . acetaminophen (TYLENOL 8 HOUR) 650 MG CR tablet Take 1 tablet (650 mg total) by mouth every 8 (eight) hours as needed for pain. 90 tablet 5  . atorvastatin (LIPITOR) 40 MG tablet Take 1 tablet (40 mg total) by mouth daily. 30 tablet 11  . carvedilol (COREG) 25 MG tablet Take 1 tablet (25 mg total) by mouth 2 (two) times daily with a meal. 60 tablet 11  . clopidogrel (PLAVIX) 75 MG tablet Take 1 tablet (75 mg total) by mouth daily. 90 tablet 3  . hydrochlorothiazide (HYDRODIURIL) 12.5 MG tablet Take 2 tablets (25 mg total) by mouth daily. Stop the Norvasc 30 tablet 5  . levothyroxine (SYNTHROID) 25 MCG tablet TAKE 1 TABLET BY MOUTH ONCE DAILY 30 tablet 11  . lisinopril (PRINIVIL,ZESTRIL) 10 MG tablet Take 2 tablets (20 mg total) by mouth daily. 60 tablet 6  . loratadine (CLARITIN) 10 MG tablet Take 1 tablet (10 mg total) by mouth daily as needed for allergies. 30 tablet 2  . memantine (NAMENDA) 10 MG tablet Take 1 tablet (10 mg total) by mouth daily. 90 tablet 3  . nitroGLYCERIN (NITROSTAT) 0.4 MG SL tablet Place 1 tablet (0.4 mg total) under the tongue every 5 (five) minutes as needed for chest pain. 15  tablet 2  . ranitidine (ZANTAC) 300 MG tablet Take 1 tablet (300 mg total) by mouth at bedtime. 90 tablet 3  . fluticasone (FLONASE) 50 MCG/ACT nasal spray Place 2 sprays into both nostrils daily. (Patient not taking: Reported on 08/26/2017) 16 g 0   No current facility-administered medications for this visit.     Allergies:   Patient has no known allergies.    Social History:  The patient  reports that  has never smoked. she has never used smokeless tobacco. She reports that she does not drink alcohol or use drugs.   Family History:  The patient's family history includes Cancer in her father; Heart disease in her mother.    ROS:  General:no colds or fevers, no weight changes Skin:no rashes or ulcers HEENT:no  blurred vision, no congestion CV:see HPI PUL:see HPI GI:no diarrhea constipation or melena, no indigestion GU:no hematuria, no dysuria MS:no joint pain, no claudication Neuro:no syncope, no lightheadedness Endo:no diabetes, + thyroid disease  Wt Readings from Last 3 Encounters:  08/26/17 144 lb 1.9 oz (65.4 kg)  08/14/17 145 lb (65.8 kg)  06/20/17 141 lb 9.6 oz (64.2 kg)     PHYSICAL EXAM: VS:  BP (!) 150/82   Pulse 77   Resp 16   Ht 4\' 11"  (1.499 m)   Wt 144 lb 1.9 oz (65.4 kg)   SpO2 98%   BMI 29.11 kg/m  , BMI Body mass index is 29.11 kg/m. General:Pleasant affect, NAD Skin:Warm and dry, brisk capillary refill HEENT:normocephalic, sclera clear, mucus membranes moist Neck:supple, no JVD, no bruits  Heart:S1S2 RRR without murmur, gallup, rub or click Lungs:clear without rales, rhonchi, or wheezes BTD:VVOH, non tender, + BS, do not palpate liver spleen or masses Ext:no lower ext edema, 2+ pedal pulses, 2+ radial pulses Neuro:alert and oriented, MAE, follows commands, + facial symmetry    EKG:  EKG is ordered today. The ekg ordered today demonstrates SR with freq PACs. No acute changes.    Recent Labs: 06/20/2017: ALT 16; BUN 24; Creatinine, Ser 1.07; Hemoglobin 12.7; Platelets 214; Potassium 4.6; Sodium 145; TSH 1.610    Lipid Panel    Component Value Date/Time   CHOL 204 (H) 06/20/2017 1648   TRIG 217 (H) 06/20/2017 1648   HDL 68 06/20/2017 1648   CHOLHDL 3.0 06/20/2017 1648   CHOLHDL 3.7 05/21/2016 1236   VLDL 43 (H) 05/21/2016 1236   LDLCALC 93 06/20/2017 1648       Other studies Reviewed: Additional studies/ records that were reviewed today include: .  Cardiac cath  Cath [facility] 09/16/11  Medical RX only small diagonal disease  Final Conclusions:  1. Preserved LV function  2. Calcification of the coronaries, with scattered plaque as noted.  3. 90% small D2, in a vessel of 1-1.87mm  4. 50% mid RCA.  Recommendations:  1. Medical therapy.  Echo  12/06/15 Study Conclusions  - Left ventricle: The cavity size was normal. Wall thickness was   normal. Systolic function was normal. The estimated ejection   fraction was in the range of 60% to 65%. Wall motion was normal;   there were no regional wall motion abnormalities. - Aortic valve: Mildly calcified annulus. Mildly thickened   leaflets.  Nuc study 2017 Study Highlights    Nuclear stress EF: 47%.  This is a low risk study.  The left ventricular ejection fraction is mildly decreased (45-54%).   Breast attenuation no ischemia or infarct EF 47% looks normal suggest echo correlation  ASSESSMENT AND PLAN:  1.  HTN elevated today, will recheck CMP on HCTZ and increase lisinopril to 20 mg daily.  Will recheck labs in 3 weeks after increase.  2. HLD with LDL at 93 we discussed adding zetia but she does not wish to add meds at this time.  She is taking the lipitor daily.  She does not eat meat.  3.  CAD non obstructive on cath in 2007, continue statin - brief shooting chest pain on rare occ.  She will follow up with [name] in 6 months..   Neg nuc study in 2017 and echo stable.  4.  Dementia on mamenda  5. DM-2 per PCP   Current medicines are reviewed with the patient today.  The patient Has no concerns regarding medicines.  The following changes have been made:  See above Labs/ tests ordered today include:see above  Disposition:   FU:  see above  Signed, Cecilie Kicks, NP  08/26/2017 4:21 PM    [facility] [place], [place], [place] Phone: [phone]; Fax: [phone]

## 2017-08-27 LAB — COMPREHENSIVE METABOLIC PANEL
A/G RATIO: 1.3 (ref 1.2–2.2)
ALK PHOS: 67 IU/L (ref 39–117)
ALT: 10 IU/L (ref 0–32)
AST: 13 IU/L (ref 0–40)
Albumin: 3.9 g/dL (ref 3.5–4.8)
BILIRUBIN TOTAL: 0.2 mg/dL (ref 0.0–1.2)
BUN/Creatinine Ratio: 15 (ref 12–28)
BUN: 17 mg/dL (ref 8–27)
CHLORIDE: 103 mmol/L (ref 96–106)
CO2: 24 mmol/L (ref 20–29)
Calcium: 9 mg/dL (ref 8.7–10.3)
Creatinine, Ser: 1.14 mg/dL — ABNORMAL HIGH (ref 0.57–1.00)
GFR calc Af Amer: 54 mL/min/{1.73_m2} — ABNORMAL LOW (ref >59)
GFR calc non Af Amer: 47 mL/min/{1.73_m2} — ABNORMAL LOW (ref >59)
GLUCOSE: 89 mg/dL (ref 65–99)
Globulin, Total: 2.9 g/dL (ref 1.5–4.5)
POTASSIUM: 4 mmol/L (ref 3.5–5.2)
Sodium: 141 mmol/L (ref 134–144)
Total Protein: 6.8 g/dL (ref 6.0–8.5)

## 2017-08-28 ENCOUNTER — Telehealth: Payer: Self-pay | Admitting: *Deleted

## 2017-08-28 DIAGNOSIS — I10 Essential (primary) hypertension: Secondary | ICD-10-CM

## 2017-08-28 DIAGNOSIS — Z79899 Other long term (current) drug therapy: Secondary | ICD-10-CM

## 2017-08-28 MED ORDER — LISINOPRIL 20 MG PO TABS: 20.0000 mg | ORAL_TABLET | Freq: Every day | ORAL | 3 refills | Status: DC

## 2017-08-28 NOTE — Telephone Encounter (Signed)
-----   Message from Isaiah Serge, NP sent at 08/28/2017  8:05 AM EST ----- Labs stable but with medication changes with lisinopril to 20 mg, check BMP in 2 weeks to re-evaluate.

## 2017-09-11 ENCOUNTER — Other Ambulatory Visit: Payer: Medicare Other

## 2017-09-12 ENCOUNTER — Telehealth: Payer: Self-pay | Admitting: Internal Medicine

## 2017-09-12 ENCOUNTER — Telehealth: Payer: Self-pay | Admitting: *Deleted

## 2017-09-12 DIAGNOSIS — Z79899 Other long term (current) drug therapy: Secondary | ICD-10-CM | POA: Insufficient documentation

## 2017-09-12 DIAGNOSIS — I10 Essential (primary) hypertension: Secondary | ICD-10-CM | POA: Diagnosis not present

## 2017-09-12 DIAGNOSIS — Z7902 Long term (current) use of antithrombotics/antiplatelets: Secondary | ICD-10-CM | POA: Insufficient documentation

## 2017-09-12 DIAGNOSIS — R55 Syncope and collapse: Secondary | ICD-10-CM | POA: Diagnosis not present

## 2017-09-12 DIAGNOSIS — E785 Hyperlipidemia, unspecified: Secondary | ICD-10-CM | POA: Insufficient documentation

## 2017-09-12 DIAGNOSIS — R42 Dizziness and giddiness: Secondary | ICD-10-CM | POA: Diagnosis not present

## 2017-09-12 DIAGNOSIS — S39012A Strain of muscle, fascia and tendon of lower back, initial encounter: Secondary | ICD-10-CM | POA: Diagnosis not present

## 2017-09-12 DIAGNOSIS — R079 Chest pain, unspecified: Secondary | ICD-10-CM | POA: Insufficient documentation

## 2017-09-12 DIAGNOSIS — E119 Type 2 diabetes mellitus without complications: Secondary | ICD-10-CM | POA: Insufficient documentation

## 2017-09-12 DIAGNOSIS — R51 Headache: Secondary | ICD-10-CM | POA: Diagnosis not present

## 2017-09-12 DIAGNOSIS — R0789 Other chest pain: Secondary | ICD-10-CM | POA: Diagnosis not present

## 2017-09-12 NOTE — Telephone Encounter (Signed)
Pt came In with her son, who was here for BP check. She asked if she can have her blood pressure checked. Pt BP reading was 162/85 P:75 She states she is taking all of medications. Instructed to f/u with PCP. Informed pt will send message to PCP. She has appointment on 09/19/2017.

## 2017-09-12 NOTE — Telephone Encounter (Signed)
Pt came in to request a letter that states that she suffers high blood pressure and has memory issues due to a stroke she had 5 years ago,this letter is for a legal reference She is willing to pick up the letter on the day of her appointment. Please follow up

## 2017-09-13 ENCOUNTER — Emergency Department (HOSPITAL_COMMUNITY)
Admission: EM | Admit: 2017-09-13 | Discharge: 2017-09-13 | Disposition: A | Discharge status: Home / Self Care | Payer: Medicare Other | Attending: Emergency Medicine | Admitting: Emergency Medicine

## 2017-09-13 ENCOUNTER — Other Ambulatory Visit: Payer: Self-pay

## 2017-09-13 ENCOUNTER — Encounter (HOSPITAL_COMMUNITY): Payer: Self-pay | Admitting: *Deleted

## 2017-09-13 ENCOUNTER — Emergency Department (HOSPITAL_COMMUNITY): Payer: Medicare Other

## 2017-09-13 DIAGNOSIS — R55 Syncope and collapse: Secondary | ICD-10-CM | POA: Diagnosis not present

## 2017-09-13 DIAGNOSIS — R079 Chest pain, unspecified: Secondary | ICD-10-CM

## 2017-09-13 DIAGNOSIS — R51 Headache: Secondary | ICD-10-CM | POA: Diagnosis not present

## 2017-09-13 DIAGNOSIS — S39012A Strain of muscle, fascia and tendon of lower back, initial encounter: Secondary | ICD-10-CM | POA: Diagnosis not present

## 2017-09-13 DIAGNOSIS — I10 Essential (primary) hypertension: Secondary | ICD-10-CM

## 2017-09-13 DIAGNOSIS — R42 Dizziness and giddiness: Secondary | ICD-10-CM

## 2017-09-13 LAB — BASIC METABOLIC PANEL
ANION GAP: 6 (ref 5–15)
BUN: 39 mg/dL — ABNORMAL HIGH (ref 6–20)
CHLORIDE: 108 mmol/L (ref 101–111)
CO2: 25 mmol/L (ref 22–32)
Calcium: 8.6 mg/dL — ABNORMAL LOW (ref 8.9–10.3)
Creatinine, Ser: 1.27 mg/dL — ABNORMAL HIGH (ref 0.44–1.00)
GFR, EST AFRICAN AMERICAN: 47 mL/min — AB (ref >60)
GFR, EST NON AFRICAN AMERICAN: 40 mL/min — AB (ref >60)
Glucose, Bld: 184 mg/dL — ABNORMAL HIGH (ref 65–99)
POTASSIUM: 3.7 mmol/L (ref 3.5–5.1)
SODIUM: 139 mmol/L (ref 135–145)

## 2017-09-13 LAB — CBC
HCT: 37.7 % (ref 36.0–46.0)
Hemoglobin: 12.4 g/dL (ref 12.0–15.0)
MCH: 30.1 pg (ref 26.0–34.0)
MCHC: 32.9 g/dL (ref 30.0–36.0)
MCV: 91.5 fL (ref 78.0–100.0)
Platelets: 251 10*3/uL (ref 150–400)
RBC: 4.12 MIL/uL (ref 3.87–5.11)
RDW: 13 % (ref 11.5–15.5)
WBC: 10.1 10*3/uL (ref 4.0–10.5)

## 2017-09-13 LAB — CBG MONITORING, ED: GLUCOSE-CAPILLARY: 193 mg/dL — AB (ref 65–99)

## 2017-09-13 LAB — I-STAT TROPONIN, ED: Troponin i, poc: 0 ng/mL (ref 0.00–0.08)

## 2017-09-13 MED ORDER — LISINOPRIL 30 MG PO TABS: 30.0000 mg | ORAL_TABLET | Freq: Every day | ORAL | 0 refills | Status: DC

## 2017-09-13 NOTE — ED Provider Notes (Signed)
[facility] DEPT Provider Note   CSN: [id number] Arrival date & time: 09/12/17  2357     History   Chief Complaint Chief Complaint  Patient presents with  . Hypertension  . Dizziness    HPI Wendy Petty is a 77 y.o. female.  The history is provided by the patient.  She has history of hypertension, hyperlipidemia, GERD, diabetes and comes in because blood pressure has been elevated.  She had complained of some intermittent, vague dizziness.  Also, some intermittent chest discomfort.  She has difficulty describing either of these.  Symptoms are not related to position or exertion.  Blood pressure was 196 when taken at a local drugstore.  She has been working with her physicians to adjust her blood pressure medications.  She denies dyspnea, nausea, vomiting, diaphoresis.  Past Medical History:  Diagnosis Date  . ANEMIA, CHRONIC   . CHEST PAIN UNSPECIFIED   . Diabetes mellitus without complication ([place]) 40/9811  . Edema   . GERD (gastroesophageal reflux disease)   . Hx of cardiovascular stress test    Lexiscan Myoview (11/14):  No ischemia, EF 68% (normal study)  . HYPERLIPIDEMIA   . Hypertension   . PULMONARY NODULE   . THYROID NODULE     Patient Active Problem List   Diagnosis Date Noted  . Breast mass, right 11/23/2015  . 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 Ear noise/buzzing 10/30/2015  . Chronic low back pain 10/30/2015  . Type 2 diabetes mellitus with microalbuminuria ([place]) 09/29/2015  . Poorly fitting dentures 09/29/2015  . Hypothyroidism 09/29/2015  . Primary osteoarthritis of both knees 09/29/2015  . Spondylolisthesis 07/19/2015  . Overweight (BMI 25.0-29.9) 06/29/2015  . Memory deficit 11/28/2014  . Coronary atherosclerosis of native coronary artery 06/24/2013  . History of TIAs 06/24/2013  . GERD (gastroesophageal reflux disease) 06/24/2013  . Depression 12/31/2012  . THYROID NODULE 02/14/2009  .  Elevated lipids 02/14/2009  . ANEMIA, CHRONIC 02/14/2009  . Essential hypertension 02/14/2009  . PULMONARY NODULE 02/14/2009    Past Surgical History:  Procedure Laterality Date  . LEFT HEART CATHETERIZATION WITH CORONARY ANGIOGRAM N/A 09/16/2011   Procedure: LEFT HEART CATHETERIZATION WITH CORONARY ANGIOGRAM;  Surgeon: [name], MD;  Location: [facility] CATH LAB;  Service: Cardiovascular;  Laterality: N/A;  . TUBAL LIGATION    . VAGINAL HYSTERECTOMY  1989    OB History    No data available       Home Medications    Prior to Admission medications   Medication Sig Start Date End Date Taking? Authorizing Provider  acetaminophen (TYLENOL 8 HOUR) 650 MG CR tablet Take 1 tablet (650 mg total) by mouth every 8 (eight) hours as needed for pain. 06/20/17   [name], MD  atorvastatin (LIPITOR) 40 MG tablet Take 1 tablet (40 mg total) by mouth daily. 06/20/17   [name], MD  carvedilol (COREG) 25 MG tablet Take 1 tablet (25 mg total) by mouth 2 (two) times daily with a meal. 06/20/17   [name], MD  clopidogrel (PLAVIX) 75 MG tablet Take 1 tablet (75 mg total) by mouth daily. 06/20/17   [name], MD  fluticasone (FLONASE) 50 MCG/ACT nasal spray Place 2 sprays into both nostrils daily. Patient not taking: Reported on 08/26/2017 05/08/16   Boykin Nearing, MD  hydrochlorothiazide (HYDRODIURIL) 12.5 MG tablet Take 2 tablets (25 mg total) by mouth daily. Stop the Norvasc 08/14/17   [name], MD  levothyroxine (SYNTHROID) 25 MCG tablet TAKE 1 TABLET BY MOUTH ONCE DAILY 06/20/17   [name], MD  lisinopril (PRINIVIL,ZESTRIL) 20 MG tablet Take 1 tablet (20 mg total) by mouth daily. 08/28/17 11/26/17  Isaiah Serge, NP  loratadine (CLARITIN) 10 MG tablet Take 1 tablet (10 mg total) by mouth daily as needed for allergies. 08/14/17   [name], MD  memantine (NAMENDA) 10 MG tablet Take 1 tablet (10 mg total) by mouth daily. 08/14/17    [name], MD  nitroGLYCERIN (NITROSTAT) 0.4 MG SL tablet Place 1 tablet (0.4 mg total) under the tongue every 5 (five) minutes as needed for chest pain. 06/20/17   [name], MD  ranitidine (ZANTAC) 300 MG tablet Take 1 tablet (300 mg total) by mouth at bedtime. 06/20/17   [name], MD    Family History Family History  Problem Relation Age of Onset  . Heart disease Mother   . Cancer Father        a tumor ruptured in his abd.    Social History Social History   Tobacco Use  . Smoking status: Never Smoker  . Smokeless tobacco: Never Used  Substance Use Topics  . Alcohol use: No    Alcohol/week: 0.0 oz  . Drug use: No     Allergies   Patient has no known allergies.   Review of Systems Review of Systems  All other systems reviewed and are negative.    Physical Exam Updated Vital Signs BP (!) 148/87 (BP Location: Right Arm)   Pulse 66   Temp 97.9 F (36.6 C) (Oral)   Resp 15   SpO2 98%   Physical Exam  Nursing note and vitals reviewed.  77 year old female, resting comfortably and in no acute distress. Vital signs are significant for hypertension. Oxygen saturation is 98%, which is normal. Head is normocephalic and atraumatic. PERRLA, EOMI. Oropharynx is clear. Neck is nontender and supple without adenopathy or JVD.  There are no carotid bruits. Back is nontender and there is no CVA tenderness. Lungs are clear without rales, wheezes, or rhonchi. Chest is nontender. Heart has regular rate and rhythm without murmur. Abdomen is soft, flat, nontender without masses or hepatosplenomegaly and peristalsis is normoactive. Extremities have no cyanosis or edema, full range of motion is present. Skin is warm and dry without rash. Neurologic: Mental status is normal, cranial nerves are intact, there are no motor or sensory deficits.  Dizziness is not reproduced by passive head movement.  ED Treatments / Results  Labs (all labs ordered are  listed, but only abnormal results are displayed) Labs Reviewed  BASIC METABOLIC PANEL - Abnormal; Notable for the following components:      Result Value   Glucose, Bld 184 (*)    BUN 39 (*)    Creatinine, Ser 1.27 (*)    Calcium 8.6 (*)    GFR calc non Af Amer 40 (*)    GFR calc Af Amer 47 (*)    All other components within normal limits  CBG MONITORING, ED - Abnormal; Notable for the following components:   Glucose-Capillary 193 (*)    All other components within normal limits  CBC  I-STAT TROPONIN, ED    EKG  EKG Interpretation  Date/Time:  Saturday September 13 2017 00:44:09 EST Ventricular Rate:  69 PR Interval:    QRS Duration: 92 QT Interval:  373 QTC Calculation: 400 R Axis:   68 Text Interpretation:  Sinus rhythm  Borderline T abnormalities, anterior leads Minimal ST elevation, inferior leads Baseline wander in lead(s) V3 V4 V5 When compared with ECG of 07/14/2013, No significant change was found Confirmed by Delora Fuel ([id number]) on 09/13/2017 12:50:20 AM       Radiology Dg Chest 2 View  Result Date: 09/13/2017 CLINICAL DATA:  Initial evaluation for acute chest pain. EXAM: CHEST  2 VIEW COMPARISON:  Prior radiograph from 09/15/2011. FINDINGS: Mild cardiomegaly, stable. Ectatic aortic arch with prominent atherosclerotic change, similar to previous. Mediastinal silhouette normal. Lungs normally inflated. No focal infiltrates. No pulmonary edema or pleural effusion. No pneumothorax. No acute osseous abnormality. IMPRESSION: 1. No active cardiopulmonary disease. 2. Extensive aortic atherosclerosis. Electronically Signed   By: Jeannine Boga M.D.   On: 09/13/2017 01:18    Procedures Procedures (including critical care time)  Medications Ordered in ED Medications - No data to display   Initial Impression / Assessment and Plan / ED Course  I have reviewed the triage vital signs and the nursing notes.  Pertinent labs & imaging results that were available during my  care of the patient were reviewed by me and considered in my medical decision making (see chart for details).  Essential hypertension.  Nonspecific chest pain and dizziness.  ECG shows no acute process, chest x-ray is unremarkable.  Old records are reviewed, and she did have a cardiology office visit last month where lisinopril was increased to 20 mg a day.  Phone conversation with cardiology office stated that if blood pressure continued to be elevated, would increase hydrochlorothiazide to 25 mg a day.  Also, cardiac catheterization in 2013 showed a 90% lesion of an ostial D2 artery which was very small caliber, no other lesions greater than or 40% and normal ejection fraction.  Chest pain today does not sound typical of cardiac pain nor does the dizziness sound significant.  Troponin has been ordered, but anticipate discharge with increasing dose of hydrochlorothiazide per plan from cardiology.  Laboratory workup shows mild renal insufficiency which is unchanged from baseline.  Per old records, patient is actually already taking hydrochlorothiazide 25 mg a day, so we will increase lisinopril to 30 mg a day.  Follow-up with her primary care provider as scheduled.  Final Clinical Impressions(s) / ED Diagnoses   Final diagnoses:  Essential hypertension  Dizziness  Chest pain, unspecified type    ED Discharge Orders        Ordered    lisinopril (PRINIVIL,ZESTRIL) 30 MG tablet  Daily     54/62/70 3500       Delora Fuel, MD 93/81/82 [phone]

## 2017-09-13 NOTE — ED Notes (Signed)
Patient ambulatory with no assist and no dizziness-home with family in stable condition

## 2017-09-13 NOTE — ED Triage Notes (Addendum)
Pt's sons reports pt's BP have been elevated x 2 days.  Started to feel dizzy and tired yesterday.  Has been taking her HTN med without relief.  No unilateral weakness, facial droop, or slurred speech.  Pt is A&Ox 4.  Pt also reports intermittent pressure in L side of her chest that is non-radiating.  She reports near syncope when the cp occurs.

## 2017-09-13 NOTE — ED Notes (Signed)
RN obtaining labs at the start of IV.

## 2017-09-13 NOTE — Discharge Instructions (Signed)
Tome su presin arterial todos los das y [place] registrada.  Deje de tomar su lisinopril, est obteniendo una receta para una dosis ms alta.

## 2017-09-17 NOTE — Telephone Encounter (Signed)
Attempt to call patient, no answer and unable to leave voicemail.

## 2017-09-19 ENCOUNTER — Encounter: Payer: Self-pay | Admitting: Internal Medicine

## 2017-09-19 ENCOUNTER — Ambulatory Visit: Payer: Medicare Other | Attending: Internal Medicine | Admitting: Internal Medicine

## 2017-09-19 VITALS — BP 162/84 | HR 76 | Temp 97.7°F | Resp 16 | Wt 144.6 lb

## 2017-09-19 DIAGNOSIS — M1991 Primary osteoarthritis, unspecified site: Secondary | ICD-10-CM | POA: Diagnosis not present

## 2017-09-19 DIAGNOSIS — D649 Anemia, unspecified: Secondary | ICD-10-CM | POA: Insufficient documentation

## 2017-09-19 DIAGNOSIS — E1129 Type 2 diabetes mellitus with other diabetic kidney complication: Secondary | ICD-10-CM | POA: Diagnosis not present

## 2017-09-19 DIAGNOSIS — Z79899 Other long term (current) drug therapy: Secondary | ICD-10-CM | POA: Diagnosis not present

## 2017-09-19 DIAGNOSIS — Z8249 Family history of ischemic heart disease and other diseases of the circulatory system: Secondary | ICD-10-CM | POA: Diagnosis not present

## 2017-09-19 DIAGNOSIS — E041 Nontoxic single thyroid nodule: Secondary | ICD-10-CM | POA: Insufficient documentation

## 2017-09-19 DIAGNOSIS — E039 Hypothyroidism, unspecified: Secondary | ICD-10-CM | POA: Diagnosis not present

## 2017-09-19 DIAGNOSIS — I2510 Atherosclerotic heart disease of native coronary artery without angina pectoris: Secondary | ICD-10-CM | POA: Diagnosis not present

## 2017-09-19 DIAGNOSIS — Z8673 Personal history of transient ischemic attack (TIA), and cerebral infarction without residual deficits: Secondary | ICD-10-CM | POA: Diagnosis not present

## 2017-09-19 DIAGNOSIS — I10 Essential (primary) hypertension: Secondary | ICD-10-CM | POA: Diagnosis not present

## 2017-09-19 DIAGNOSIS — G8929 Other chronic pain: Secondary | ICD-10-CM | POA: Diagnosis not present

## 2017-09-19 DIAGNOSIS — F329 Major depressive disorder, single episode, unspecified: Secondary | ICD-10-CM | POA: Insufficient documentation

## 2017-09-19 DIAGNOSIS — E119 Type 2 diabetes mellitus without complications: Secondary | ICD-10-CM | POA: Insufficient documentation

## 2017-09-19 DIAGNOSIS — Z809 Family history of malignant neoplasm, unspecified: Secondary | ICD-10-CM | POA: Diagnosis not present

## 2017-09-19 DIAGNOSIS — Z9889 Other specified postprocedural states: Secondary | ICD-10-CM | POA: Insufficient documentation

## 2017-09-19 DIAGNOSIS — K219 Gastro-esophageal reflux disease without esophagitis: Secondary | ICD-10-CM | POA: Diagnosis not present

## 2017-09-19 DIAGNOSIS — R809 Proteinuria, unspecified: Secondary | ICD-10-CM

## 2017-09-19 DIAGNOSIS — Z7901 Long term (current) use of anticoagulants: Secondary | ICD-10-CM | POA: Insufficient documentation

## 2017-09-19 LAB — GLUCOSE, POCT (MANUAL RESULT ENTRY): POC Glucose: 156 mg/dl — AB (ref 70–99)

## 2017-09-19 MED ORDER — HYDROCHLOROTHIAZIDE 25 MG PO TABS: 25.0000 mg | ORAL_TABLET | Freq: Every day | ORAL | 6 refills | Status: DC

## 2017-09-19 MED ORDER — LISINOPRIL 20 MG PO TABS: 20.0000 mg | ORAL_TABLET | Freq: Every day | ORAL | 6 refills | Status: DC

## 2017-09-19 NOTE — Progress Notes (Signed)
Patient ID: Wendy Petty, female    DOB: [birth year]  MRN: [id number]  CC: Hospitalization Follow-up; Hypertension; and Letter for School/Work   Subjective: Wendy Petty is a 77 y.o. female who presents for ER f/u.  I last saw her 1 mth ago Her concerns today include:  Hx ofHTN, DM, hypothyroid, GERD, coronary atherosclerosis, HL,lacunar CVA,OA back, hip and knees  1. Saw her cardiologist since last visit with me. BP was still elevated so Lisinopril was inc to 20 mg. She was subsequently seen in ER and Lisinopril increased to 30 mg. She has had an increase of creat and dec in GFR  2.  Requesting letter stating what her medical conditions are for her lawyer.  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  She depends on her children to assist with her care Patient Active Problem List   Diagnosis Date Noted  . Breast mass, right 11/23/2015  . 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 Ear noise/buzzing 10/30/2015  . Chronic low back pain 10/30/2015  . Type 2 diabetes mellitus with microalbuminuria ([place]) 09/29/2015  . Poorly fitting dentures 09/29/2015  . Hypothyroidism 09/29/2015  . Primary osteoarthritis of both knees 09/29/2015  . Spondylolisthesis 07/19/2015  . Overweight (BMI 25.0-29.9) 06/29/2015  . Memory deficit 11/28/2014  . Coronary atherosclerosis of native coronary artery 06/24/2013  . History of TIAs 06/24/2013  . GERD (gastroesophageal reflux disease) 06/24/2013  . Depression 12/31/2012  . THYROID NODULE 02/14/2009  . Elevated lipids 02/14/2009  . ANEMIA, CHRONIC 02/14/2009  . Essential hypertension 02/14/2009  . PULMONARY NODULE 02/14/2009     Current Outpatient Medications on File Prior to Visit  Medication Sig Dispense Refill  . acetaminophen (TYLENOL 8 HOUR) 650 MG CR tablet Take 1 tablet (650 mg total) by mouth every 8 (eight) hours as needed for pain. 90 tablet 5  . atorvastatin  (LIPITOR) 40 MG tablet Take 1 tablet (40 mg total) by mouth daily. 30 tablet 11  . carvedilol (COREG) 25 MG tablet Take 1 tablet (25 mg total) by mouth 2 (two) times daily with a meal. 60 tablet 11  . clopidogrel (PLAVIX) 75 MG tablet Take 1 tablet (75 mg total) by mouth daily. 90 tablet 3  . levothyroxine (SYNTHROID) 25 MCG tablet TAKE 1 TABLET BY MOUTH ONCE DAILY 30 tablet 11  . loratadine (CLARITIN) 10 MG tablet Take 1 tablet (10 mg total) by mouth daily as needed for allergies. 30 tablet 2  . memantine (NAMENDA) 10 MG tablet Take 1 tablet (10 mg total) by mouth daily. 90 tablet 3  . nitroGLYCERIN (NITROSTAT) 0.4 MG SL tablet Place 1 tablet (0.4 mg total) under the tongue every 5 (five) minutes as needed for chest pain. 15 tablet 2  . ranitidine (ZANTAC) 300 MG tablet Take 1 tablet (300 mg total) by mouth at bedtime. 90 tablet 3  . [DISCONTINUED] cetirizine (ZYRTEC) 10 MG tablet Take 10 mg by mouth daily.      . [DISCONTINUED] losartan-hydrochlorothiazide (HYZAAR) 100-25 MG per tablet Take 1 tablet by mouth daily.       No current facility-administered medications on file prior to visit.     No Known Allergies  Social History   Socioeconomic History  . Marital status: Married    Spouse name: Not on file  . Number of children: Not on file  . Years of education: Not on file  . Highest education level: Not on file  Social Needs  . Financial resource strain: Not on file  . Food insecurity - worry: Not on file  . Food insecurity - inability: Not on file  . Transportation needs - medical: Not on file  . Transportation needs - non-medical: Not on file  Occupational History  . Not on file  Tobacco Use  . Smoking status: Never Smoker  . Smokeless tobacco: Never Used  Substance and Sexual Activity  . Alcohol use: No    Alcohol/week: 0.0 oz  . Drug use: No  . Sexual activity: No  Other Topics Concern  . Not on file  Social History Narrative   Lives with daughter in a 2 story home.   Does not work.     Family History  Problem Relation Age of Onset  . Heart disease Mother   . Cancer Father        a tumor ruptured in his abd.    Past Surgical History:  Procedure Laterality Date  . LEFT HEART CATHETERIZATION WITH CORONARY ANGIOGRAM N/A 09/16/2011   Procedure: LEFT HEART CATHETERIZATION WITH CORONARY ANGIOGRAM;  Surgeon: [name], MD;  Location: [facility] CATH LAB;  Service: Cardiovascular;  Laterality: N/A;  . TUBAL LIGATION    . VAGINAL HYSTERECTOMY  1989    ROS: Review of Systems Negative except as stated above PHYSICAL EXAM: BP (!) 162/84 (BP Location: Left Arm, Cuff Size: Small)   Pulse 76   Temp 97.7 F (36.5 C) (Oral)   Resp 16   Wt 144 lb 9.6 oz (65.6 kg)   SpO2 97%   BMI 29.21 kg/m   Repeat BP 160/80 Physical Exam  General appearance - alert, well appearing, and in no distress Chest - clear to auscultation, no wheezes, rales or rhonchi, symmetric air entry Heart - normal rate, regular rhythm, normal S1, S2, no murmurs, rubs, clicks or gallops Ext: trace to 1 + BL LE edema  ASSESSMENT AND PLAN: 1. Essential hypertension Not at goal. - will change lisinopril back to 20 mg a day as intended by cardiology.  Increase HCTZ to 25 mg daily. -Future BMP for next week. - lisinopril (PRINIVIL,ZESTRIL) 20 MG tablet; Take 1 tablet (20 mg total) by mouth daily. Dose increase  Dispense: 30 tablet; Refill: 6 - hydrochlorothiazide (HYDRODIURIL) 25 MG tablet; Take 1 tablet (25 mg total) by mouth daily. Stop the Norvasc  Dispense: 30 tablet; Refill: 6 - Basic Metabolic Panel; Future  2. Type 2 diabetes mellitus with microalbuminuria, without long-term current use of insulin (HCC) - POCT glucose (manual entry)  Letter written for her to give to the lawyer regarding her medical condition.  Patient was given the opportunity to ask questions.  Patient verbalized understanding of the plan and was able to repeat key elements of the plan.  [employer] interpreter used  during this encounter.  Orders Placed This Encounter  Procedures  . Basic Metabolic Panel  . POCT glucose (manual entry)     Requested Prescriptions   Signed Prescriptions Disp Refills  . lisinopril (PRINIVIL,ZESTRIL) 20 MG tablet 30 tablet 6    Sig: Take 1 tablet (20 mg total) by mouth daily. Dose increase  . hydrochlorothiazide (HYDRODIURIL) 25 MG tablet 30 tablet 6    Sig: Take 1 tablet (25 mg total) by mouth daily. Stop the Norvasc    Return in about 6 weeks (around 10/31/2017).  Karle Plumber, MD, FACP

## 2017-09-19 NOTE — Patient Instructions (Signed)
Give appointment with lab next week

## 2017-09-19 NOTE — Progress Notes (Signed)
Pt states she is needing 2 letters one for immigration and the other is for lawyer

## 2017-09-24 ENCOUNTER — Ambulatory Visit: Payer: Medicare Other | Attending: Internal Medicine

## 2017-09-24 DIAGNOSIS — I10 Essential (primary) hypertension: Secondary | ICD-10-CM

## 2017-09-25 LAB — BASIC METABOLIC PANEL
BUN/Creatinine Ratio: 28 (ref 12–28)
BUN: 28 mg/dL — ABNORMAL HIGH (ref 8–27)
CALCIUM: 9.6 mg/dL (ref 8.7–10.3)
CO2: 27 mmol/L (ref 20–29)
Chloride: 103 mmol/L (ref 96–106)
Creatinine, Ser: 1 mg/dL (ref 0.57–1.00)
GFR calc Af Amer: 64 mL/min/{1.73_m2} (ref >59)
GFR calc non Af Amer: 55 mL/min/{1.73_m2} — ABNORMAL LOW (ref >59)
GLUCOSE: 119 mg/dL — AB (ref 65–99)
POTASSIUM: 5.5 mmol/L — AB (ref 3.5–5.2)
SODIUM: 142 mmol/L (ref 134–144)

## 2017-09-28 ENCOUNTER — Telehealth: Payer: Self-pay | Admitting: Internal Medicine

## 2017-09-28 DIAGNOSIS — E875 Hyperkalemia: Secondary | ICD-10-CM

## 2017-09-28 NOTE — Telephone Encounter (Signed)
Phone call placed to patient today using [place] [phone]) to go over lab results.  VM on home phone was full and could not accept messages.  We tried the mobile number on file and left a message stating who I am and that I was calling her about her lab results.  I requested that she please call us back tomorrow.  If patient calls back please let her know that her potassium level is elevated.  And need for her to return to the lab for repeat potassium check.  Decrease intake of potassium rich foods like bananas, oranges and orange juice.

## 2017-09-30 ENCOUNTER — Telehealth: Payer: Self-pay

## 2017-09-30 NOTE — Telephone Encounter (Signed)
[facility] [phone] contacted pt to go over lab results pt is aware and doesn't have any questions or concerns

## 2017-10-29 ENCOUNTER — Telehealth: Payer: Self-pay | Admitting: Internal Medicine

## 2017-10-29 DIAGNOSIS — I10 Essential (primary) hypertension: Secondary | ICD-10-CM

## 2017-10-29 MED ORDER — CARVEDILOL 25 MG PO TABS: 25.0000 mg | ORAL_TABLET | Freq: Two times a day (BID) | ORAL | 11 refills | Status: DC

## 2017-10-29 NOTE — Telephone Encounter (Signed)
Pt came in to request a refill on all her blood pressure medications until her next appointment. Please follow up

## 2017-10-29 NOTE — Addendum Note (Signed)
Addended by: Karle Plumber B on: 10/29/2017 01:52 PM   Modules accepted: Orders

## 2017-10-31 ENCOUNTER — Ambulatory Visit: Payer: Medicare Other | Admitting: Internal Medicine

## 2017-11-20 ENCOUNTER — Ambulatory Visit: Payer: Medicare Other | Attending: Internal Medicine | Admitting: Internal Medicine

## 2017-11-20 ENCOUNTER — Encounter: Payer: Self-pay | Admitting: Internal Medicine

## 2017-11-20 ENCOUNTER — Other Ambulatory Visit: Payer: Self-pay

## 2017-11-20 VITALS — BP 192/90 | HR 74 | Temp 98.0°F | Resp 16 | Wt 141.0 lb

## 2017-11-20 DIAGNOSIS — M1610 Unilateral primary osteoarthritis, unspecified hip: Secondary | ICD-10-CM | POA: Insufficient documentation

## 2017-11-20 DIAGNOSIS — L309 Dermatitis, unspecified: Secondary | ICD-10-CM | POA: Insufficient documentation

## 2017-11-20 DIAGNOSIS — M1991 Primary osteoarthritis, unspecified site: Secondary | ICD-10-CM | POA: Insufficient documentation

## 2017-11-20 DIAGNOSIS — F329 Major depressive disorder, single episode, unspecified: Secondary | ICD-10-CM | POA: Diagnosis not present

## 2017-11-20 DIAGNOSIS — Z7902 Long term (current) use of antithrombotics/antiplatelets: Secondary | ICD-10-CM | POA: Diagnosis not present

## 2017-11-20 DIAGNOSIS — Z79899 Other long term (current) drug therapy: Secondary | ICD-10-CM | POA: Diagnosis not present

## 2017-11-20 DIAGNOSIS — E785 Hyperlipidemia, unspecified: Secondary | ICD-10-CM | POA: Insufficient documentation

## 2017-11-20 DIAGNOSIS — G8929 Other chronic pain: Secondary | ICD-10-CM | POA: Diagnosis not present

## 2017-11-20 DIAGNOSIS — M545 Low back pain: Secondary | ICD-10-CM | POA: Insufficient documentation

## 2017-11-20 DIAGNOSIS — E039 Hypothyroidism, unspecified: Secondary | ICD-10-CM | POA: Diagnosis not present

## 2017-11-20 DIAGNOSIS — R0789 Other chest pain: Secondary | ICD-10-CM | POA: Insufficient documentation

## 2017-11-20 DIAGNOSIS — D649 Anemia, unspecified: Secondary | ICD-10-CM | POA: Diagnosis not present

## 2017-11-20 DIAGNOSIS — M479 Spondylosis, unspecified: Secondary | ICD-10-CM | POA: Diagnosis not present

## 2017-11-20 DIAGNOSIS — K219 Gastro-esophageal reflux disease without esophagitis: Secondary | ICD-10-CM | POA: Diagnosis not present

## 2017-11-20 DIAGNOSIS — Z8673 Personal history of transient ischemic attack (TIA), and cerebral infarction without residual deficits: Secondary | ICD-10-CM | POA: Insufficient documentation

## 2017-11-20 DIAGNOSIS — I10 Essential (primary) hypertension: Secondary | ICD-10-CM | POA: Insufficient documentation

## 2017-11-20 DIAGNOSIS — R079 Chest pain, unspecified: Secondary | ICD-10-CM

## 2017-11-20 DIAGNOSIS — I2510 Atherosclerotic heart disease of native coronary artery without angina pectoris: Secondary | ICD-10-CM | POA: Insufficient documentation

## 2017-11-20 DIAGNOSIS — Z79890 Hormone replacement therapy (postmenopausal): Secondary | ICD-10-CM | POA: Diagnosis not present

## 2017-11-20 DIAGNOSIS — E119 Type 2 diabetes mellitus without complications: Secondary | ICD-10-CM | POA: Diagnosis not present

## 2017-11-20 MED ORDER — TRIAMCINOLONE ACETONIDE 0.1 % EX CREA: 1.0000 "application " | TOPICAL_CREAM | Freq: Two times a day (BID) | CUTANEOUS | 0 refills | Status: DC

## 2017-11-20 MED ORDER — LISINOPRIL 20 MG PO TABS: 20.0000 mg | ORAL_TABLET | Freq: Every day | ORAL | 6 refills | Status: DC

## 2017-11-20 NOTE — Patient Instructions (Signed)
Nurse only BP check in 2 weeks.

## 2017-11-20 NOTE — Progress Notes (Signed)
Patient ID: Wendy Petty, female    DOB: Sep 21, 1940  MRN: [id number]  CC: Hypertension   Subjective: Wendy Petty is a 77 y.o. female who presents for follow-up on blood pressure. Her concerns today include:  Hx ofHTN, DM, hypothyroid, GERD, coronary atherosclerosis, HL,lacunar CVA,OA back, hip and knees  1.  C/o pain LT chest intermittent x 8 days. Occurs only when she is laying down about to fall asleep.  She has to get up and walk around.  "I talk to myself and tell myself to calm down and I give myself a massage. Then the pain goes a way." Reports it is a mild pain that last several seconds. Problems sleeping because she is worried that something will happen to her during the night. -no radiation -BP elevated today.  She took meds already for today.  Has bottles with her.  She has been refilling Lisinopril of old prescription for 10 mg instead of 20 mg as discussed and prescribed on last visit Patient Active Problem List   Diagnosis Date Noted  . Breast mass, right 11/23/2015  . 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 Ear noise/buzzing 10/30/2015  . Chronic low back pain 10/30/2015  . Type 2 diabetes mellitus with microalbuminuria ([place]) 09/29/2015  . Poorly fitting dentures 09/29/2015  . Hypothyroidism 09/29/2015  . Primary osteoarthritis of both knees 09/29/2015  . Spondylolisthesis 07/19/2015  . Overweight (BMI 25.0-29.9) 06/29/2015  . Memory deficit 11/28/2014  . Coronary atherosclerosis of native coronary artery 06/24/2013  . History of TIAs 06/24/2013  . GERD (gastroesophageal reflux disease) 06/24/2013  . Depression 12/31/2012  . THYROID NODULE 02/14/2009  . Elevated lipids 02/14/2009  . ANEMIA, CHRONIC 02/14/2009  . Essential hypertension 02/14/2009  . PULMONARY NODULE 02/14/2009     Current Outpatient Medications on File Prior to Visit  Medication Sig Dispense Refill  . acetaminophen (TYLENOL 8 HOUR) 650 MG CR tablet Take 1  tablet (650 mg total) by mouth every 8 (eight) hours as needed for pain. 90 tablet 5  . atorvastatin (LIPITOR) 40 MG tablet Take 1 tablet (40 mg total) by mouth daily. 30 tablet 11  . carvedilol (COREG) 25 MG tablet Take 1 tablet (25 mg total) by mouth 2 (two) times daily with a meal. 60 tablet 11  . clopidogrel (PLAVIX) 75 MG tablet Take 1 tablet (75 mg total) by mouth daily. 90 tablet 3  . hydrochlorothiazide (HYDRODIURIL) 25 MG tablet Take 1 tablet (25 mg total) by mouth daily. Stop the Norvasc 30 tablet 6  . levothyroxine (SYNTHROID) 25 MCG tablet TAKE 1 TABLET BY MOUTH ONCE DAILY 30 tablet 11  . loratadine (CLARITIN) 10 MG tablet Take 1 tablet (10 mg total) by mouth daily as needed for allergies. 30 tablet 2  . memantine (NAMENDA) 10 MG tablet Take 1 tablet (10 mg total) by mouth daily. 90 tablet 3  . nitroGLYCERIN (NITROSTAT) 0.4 MG SL tablet Place 1 tablet (0.4 mg total) under the tongue every 5 (five) minutes as needed for chest pain. 15 tablet 2  . ranitidine (ZANTAC) 300 MG tablet Take 1 tablet (300 mg total) by mouth at bedtime. 90 tablet 3  . [DISCONTINUED] cetirizine (ZYRTEC) 10 MG tablet Take 10 mg by mouth daily.      . [DISCONTINUED] losartan-hydrochlorothiazide (HYZAAR) 100-25 MG per tablet Take 1 tablet by mouth daily.       No current facility-administered medications on file prior to visit.     No Known Allergies  Social History   Socioeconomic History  . Marital status: Married    Spouse name: Not on file  . Number of children: Not on file  . Years of education: Not on file  . Highest education level: Not on file  Social Needs  . Financial resource strain: Not on file  . Food insecurity - worry: Not on file  . Food insecurity - inability: Not on file  . Transportation needs - medical: Not on file  . Transportation needs - non-medical: Not on file  Occupational History  . Not on file  Tobacco Use  . Smoking status: Never Smoker  . Smokeless tobacco: Never Used    Substance and Sexual Activity  . Alcohol use: No    Alcohol/week: 0.0 oz  . Drug use: No  . Sexual activity: No  Other Topics Concern  . Not on file  Social History Narrative   Lives with daughter in a 2 story home.  Does not work.     Family History  Problem Relation Age of Onset  . Heart disease Mother   . Cancer Father        a tumor ruptured in his abd.    Past Surgical History:  Procedure Laterality Date  . LEFT HEART CATHETERIZATION WITH CORONARY ANGIOGRAM N/A 09/16/2011   Procedure: LEFT HEART CATHETERIZATION WITH CORONARY ANGIOGRAM;  Surgeon: [name], MD;  Location[facility] LAB;  Service: Cardiovascular;  Laterality: N/A;  . TUBAL LIGATION    . VAGINAL HYSTERECTOMY  1989    ROS: Review of Systems Derm: Complains of some cracking and peeling at the tips of both thumb PHYSICAL EXAM: BP (!) 192/90   Pulse 74   Temp 98 F (36.7 C) (Oral)   Resp 16   Wt 141 lb (64 kg)   SpO2 96%   BMI 28.48 kg/m   Wt Readings from Last 3 Encounters:  11/20/17 141 lb (64 kg)  09/19/17 144 lb 9.6 oz (65.6 kg)  08/26/17 144 lb 1.9 oz (65.4 kg)    Physical Exam  General appearance - alert, well appearing, and in no distress Mental status -patient very talkative and poor historian Chest - clear to auscultation, no wheezes, rales or rhonchi, symmetric air entry Heart - normal rate, regular rhythm, normal S1, S2, no murmurs, rubs, clicks or gallops.  No reproducible tenderness. Breasts - breasts appear normal, no suspicious masses, no skin or nipple changes or axillary nodes Extremities - peripheral pulses normal, no pedal edema, no clubbing or cyanosis Skin -mild peeling and cracking at the tips of both thumbs  Lab Results  Component Value Date   HGBA1C 6.6 06/20/2017   EKG revealed sinus rhythm, good R wave progression, no LVH T wave inversion in V1.  Unchanged from previous EKG  ASSESSMENT AND PLAN: 1. Left sided chest pain Sounds very atypical.  We talked about  adding medication like trazodone to help with sleep but patient does not feel it necessary at this time.  She takes Namenda at nights. - EKG 12-Lead  2. Essential hypertension Uncontrolled.  I printed prescription for lisinopril 20 mg and requested that she presented that to her pharmacy Nurse only blood pressure check in 2 weeks - lisinopril (PRINIVIL,ZESTRIL) 20 MG tablet; Take 1 tablet (20 mg total) by mouth daily. Dose increase  Dispense: 30 tablet; Refill: 6  3. Dermatitis - triamcinolone cream (KENALOG) 0.1 %; Apply 1 application topically 2 (two) times daily.  Dispense: 30 g; Refill: 0  Patient was  given the opportunity to ask questions.  Patient verbalized understanding of the plan and was able to repeat key elements of the plan.  [employer] interpreter used during this encounter.  Orders Placed This Encounter  Procedures  . EKG 12-Lead     Requested Prescriptions   Signed Prescriptions Disp Refills  . lisinopril (PRINIVIL,ZESTRIL) 20 MG tablet 30 tablet 6    Sig: Take 1 tablet (20 mg total) by mouth daily. Dose increase  . triamcinolone cream (KENALOG) 0.1 % 30 g 0    Sig: Apply 1 application topically 2 (two) times daily.    Return in about 2 months (around 01/20/2018).  Karle Plumber, MD, FACP

## 2018-01-20 ENCOUNTER — Ambulatory Visit: Payer: Medicare Other | Admitting: Internal Medicine

## 2018-02-23 ENCOUNTER — Ambulatory Visit: Payer: Medicare Other | Admitting: Cardiovascular Disease

## 2018-02-24 ENCOUNTER — Encounter: Payer: Self-pay | Admitting: Cardiovascular Disease

## 2018-03-27 ENCOUNTER — Ambulatory Visit: Payer: Medicare Other | Attending: Internal Medicine | Admitting: Internal Medicine

## 2018-03-27 ENCOUNTER — Encounter: Payer: Self-pay | Admitting: Internal Medicine

## 2018-03-27 VITALS — BP 121/67 | HR 82 | Temp 98.1°F | Resp 16 | Wt 135.6 lb

## 2018-03-27 DIAGNOSIS — E119 Type 2 diabetes mellitus without complications: Secondary | ICD-10-CM | POA: Diagnosis not present

## 2018-03-27 DIAGNOSIS — Z79899 Other long term (current) drug therapy: Secondary | ICD-10-CM | POA: Insufficient documentation

## 2018-03-27 DIAGNOSIS — Z8673 Personal history of transient ischemic attack (TIA), and cerebral infarction without residual deficits: Secondary | ICD-10-CM | POA: Insufficient documentation

## 2018-03-27 DIAGNOSIS — I10 Essential (primary) hypertension: Secondary | ICD-10-CM | POA: Insufficient documentation

## 2018-03-27 DIAGNOSIS — K219 Gastro-esophageal reflux disease without esophagitis: Secondary | ICD-10-CM | POA: Insufficient documentation

## 2018-03-27 DIAGNOSIS — E1129 Type 2 diabetes mellitus with other diabetic kidney complication: Secondary | ICD-10-CM | POA: Diagnosis not present

## 2018-03-27 DIAGNOSIS — Z90710 Acquired absence of both cervix and uterus: Secondary | ICD-10-CM | POA: Diagnosis not present

## 2018-03-27 DIAGNOSIS — Z9851 Tubal ligation status: Secondary | ICD-10-CM | POA: Insufficient documentation

## 2018-03-27 DIAGNOSIS — M545 Low back pain: Secondary | ICD-10-CM | POA: Diagnosis not present

## 2018-03-27 DIAGNOSIS — Z79890 Hormone replacement therapy (postmenopausal): Secondary | ICD-10-CM | POA: Insufficient documentation

## 2018-03-27 DIAGNOSIS — R809 Proteinuria, unspecified: Secondary | ICD-10-CM | POA: Diagnosis not present

## 2018-03-27 DIAGNOSIS — M170 Bilateral primary osteoarthritis of knee: Secondary | ICD-10-CM | POA: Insufficient documentation

## 2018-03-27 DIAGNOSIS — I2510 Atherosclerotic heart disease of native coronary artery without angina pectoris: Secondary | ICD-10-CM | POA: Diagnosis not present

## 2018-03-27 DIAGNOSIS — E038 Other specified hypothyroidism: Secondary | ICD-10-CM | POA: Insufficient documentation

## 2018-03-27 LAB — GLUCOSE, POCT (MANUAL RESULT ENTRY): POC GLUCOSE: 152 mg/dL — AB (ref 70–99)

## 2018-03-27 MED ORDER — DICLOFENAC SODIUM 1 % TD GEL: 2.0000 g | Freq: Four times a day (QID) | TRANSDERMAL | 2 refills | Status: DC

## 2018-03-27 MED ORDER — OMEPRAZOLE 20 MG PO CPDR: 20.0000 mg | DELAYED_RELEASE_CAPSULE | Freq: Every day | ORAL | 3 refills | Status: DC

## 2018-03-27 NOTE — Progress Notes (Signed)
Patient ID: Wendy Petty, female    DOB: 11-02-1940  MRN: [id number]  CC: Diabetes and Hypertension   Subjective: Wendy Petty is a 77 y.o. female who presents for chronic ds management Her concerns today include:  Hx ofHTN, DM, hypothyroid, GERD, coronary atherosclerosis, HL,lacunar CVA,OA back, hip and knees   Complains of pain in lower back and in her knees.  No worse than previous.  She has known arthritis in the back hips and knees.  She was in [place] for the past several months.  She was prescribed a medication for her pain called  Dexketoprofeno, which is an anti-inflammatory.  She found it helpful but it upset her stomach.  She was given omeprazole to take.  She is wanting a similar medication.  She is also asking for refill on omeprazole.  GERD:  Given Omeprazole in [place] and would like a refill.  Hypothyroid: compliant with Levothyroxine  DM:  Does not check BS.  Reports that she is doing okay with her eating habits.  She tries to stay active as much as her knees will allow.  HTN/HL/hx CVA: compliant with Lisinopril, Coreg, HCTZ and Lipitor.  No headaches or dizziness.  Poor memory:  On Namenda.  She reports that her daughter is also developing problems with her memory and wanting to know if I can prescribe something for her daughter.  Her daughter is not 1 of my patients. Patient Active Problem List   Diagnosis Date Noted  . Breast mass, right 11/23/2015  . 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 Ear noise/buzzing 10/30/2015  . Chronic low back pain 10/30/2015  . Type 2 diabetes mellitus with microalbuminuria ([place]) 09/29/2015  . Poorly fitting dentures 09/29/2015  . Hypothyroidism 09/29/2015  . Primary osteoarthritis of both knees 09/29/2015  . Spondylolisthesis 07/19/2015  . Overweight (BMI 25.0-29.9) 06/29/2015  . Memory deficit 11/28/2014  . Coronary atherosclerosis of native coronary artery 06/24/2013  . History of TIAs  06/24/2013  . GERD (gastroesophageal reflux disease) 06/24/2013  . Depression 12/31/2012  . THYROID NODULE 02/14/2009  . Elevated lipids 02/14/2009  . ANEMIA, CHRONIC 02/14/2009  . Essential hypertension 02/14/2009  . PULMONARY NODULE 02/14/2009     Current Outpatient Medications on File Prior to Visit  Medication Sig Dispense Refill  . acetaminophen (TYLENOL 8 HOUR) 650 MG CR tablet Take 1 tablet (650 mg total) by mouth every 8 (eight) hours as needed for pain. 90 tablet 5  . atorvastatin (LIPITOR) 40 MG tablet Take 1 tablet (40 mg total) by mouth daily. 30 tablet 11  . carvedilol (COREG) 25 MG tablet Take 1 tablet (25 mg total) by mouth 2 (two) times daily with a meal. 60 tablet 11  . clopidogrel (PLAVIX) 75 MG tablet Take 1 tablet (75 mg total) by mouth daily. 90 tablet 3  . hydrochlorothiazide (HYDRODIURIL) 25 MG tablet Take 1 tablet (25 mg total) by mouth daily. Stop the Norvasc 30 tablet 6  . levothyroxine (SYNTHROID) 25 MCG tablet TAKE 1 TABLET BY MOUTH ONCE DAILY 30 tablet 11  . lisinopril (PRINIVIL,ZESTRIL) 20 MG tablet Take 1 tablet (20 mg total) by mouth daily. Dose increase 30 tablet 6  . loratadine (CLARITIN) 10 MG tablet Take 1 tablet (10 mg total) by mouth daily as needed for allergies. 30 tablet 2  . memantine (NAMENDA) 10 MG tablet Take 1 tablet (10 mg total) by mouth daily. 90 tablet 3  . nitroGLYCERIN (NITROSTAT) 0.4 MG SL tablet Place 1 tablet (0.4  mg total) under the tongue every 5 (five) minutes as needed for chest pain. 15 tablet 2  . ranitidine (ZANTAC) 300 MG tablet Take 1 tablet (300 mg total) by mouth at bedtime. 90 tablet 3  . triamcinolone cream (KENALOG) 0.1 % Apply 1 application topically 2 (two) times daily. 30 g 0  . [DISCONTINUED] cetirizine (ZYRTEC) 10 MG tablet Take 10 mg by mouth daily.      . [DISCONTINUED] losartan-hydrochlorothiazide (HYZAAR) 100-25 MG per tablet Take 1 tablet by mouth daily.       No current facility-administered medications on file  prior to visit.     No Known Allergies  Social History   Socioeconomic History  . Marital status: Married    Spouse name: Not on file  . Number of children: Not on file  . Years of education: Not on file  . Highest education level: Not on file  Occupational History  . Not on file  Social Needs  . Financial resource strain: Not on file  . Food insecurity:    Worry: Not on file    Inability: Not on file  . Transportation needs:    Medical: Not on file    Non-medical: Not on file  Tobacco Use  . Smoking status: Never Smoker  . Smokeless tobacco: Never Used  Substance and Sexual Activity  . Alcohol use: No    Alcohol/week: 0.0 oz  . Drug use: No  . Sexual activity: Never  Lifestyle  . Physical activity:    Days per week: Not on file    Minutes per session: Not on file  . Stress: Not on file  Relationships  . Social connections:    Talks on phone: Not on file    Gets together: Not on file    Attends religious service: Not on file    Active member of club or organization: Not on file    Attends meetings of clubs or organizations: Not on file    Relationship status: Not on file  . Intimate partner violence:    Fear of current or ex partner: Not on file    Emotionally abused: Not on file    Physically abused: Not on file    Forced sexual activity: Not on file  Other Topics Concern  . Not on file  Social History Narrative   Lives with daughter in a 2 story home.  Does not work.     Family History  Problem Relation Age of Onset  . Heart disease Mother   . Cancer Father        a tumor ruptured in his abd.    Past Surgical History:  Procedure Laterality Date  . LEFT HEART CATHETERIZATION WITH CORONARY ANGIOGRAM N/A 09/16/2011   Procedure: LEFT HEART CATHETERIZATION WITH CORONARY ANGIOGRAM;  Surgeon: [name], MD;  Location: [facility] CATH LAB;  Service: Cardiovascular;  Laterality: N/A;  . TUBAL LIGATION    . VAGINAL HYSTERECTOMY  1989    ROS: Review of  Systems Negative except as stated above PHYSICAL EXAM: BP 121/67   Pulse 82   Temp 98.1 F (36.7 C) (Oral)   Resp 16   Wt 135 lb 9.6 oz (61.5 kg)   SpO2 97%   BMI 27.39 kg/m   Physical Exam  General appearance - alert, well appearing, and in no distress Mental status - normal mood, behavior, speech, dress, motor activity, and thought processes Mouth - mucous membranes moist, pharynx normal without lesions Neck - supple, no significant  adenopathy Chest - clear to auscultation, no wheezes, rales or rhonchi, symmetric air entry Heart - normal rate, regular rhythm, normal S1, S2, no murmurs, rubs, clicks or gallops Musculoskeletal -knees: Joints are enlarged.  No point tenderness.  Mild crepitus on passive range of motion.  Patient ambulates unassisted.  She transfers unassisted.  Gait is stable.   Extremities - peripheral pulses normal, no pedal edema, no clubbing or cyanosis  Results for orders placed or performed in visit on 03/27/18  POCT glucose (manual entry)  Result Value Ref Range   POC Glucose 152 (A) 70 - 99 mg/dl   Lab Results  Component Value Date   HGBA1C 6.6 06/20/2017   Results for orders placed or performed in visit on 03/27/18  POCT glucose (manual entry)  Result Value Ref Range   POC Glucose 152 (A) 70 - 99 mg/dl   ASSESSMENT AND PLAN: 1. Type 2 diabetes mellitus with microalbuminuria, without long-term current use of insulin (HCC) - POCT glucose (manual entry) - Hemoglobin R7E - Basic Metabolic Panel; Future  2. Essential hypertension At goal.  Continue lisinopril, carvedilol and HCTZ.  3. Primary osteoarthritis of both knees Given her age, we will try her with topical NSAID instead of oral. - diclofenac sodium (VOLTAREN) 1 % GEL; Apply 2 g topically 4 (four) times daily.  Dispense: 100 g; Refill: 2  4. Gastroesophageal reflux disease, esophagitis presence not specified GERD precautions discussed - omeprazole (PRILOSEC) 20 MG capsule; Take 1 capsule  (20 mg total) by mouth daily.  Dispense: 30 capsule; Refill: 3  5. Other specified hypothyroidism - TSH; Future  Patient advised that I am unable to prescribe anything for her daughter's memory issue since she is not under my care.  Advised that her daughter get establish with the PCP if she does not have one already  Patient was given the opportunity to ask questions.  Patient verbalized understanding of the plan and was able to repeat key elements of the plan.  [employer] interpreter used during this encounter.  Orders Placed This Encounter  Procedures  . Hemoglobin A1c  . Basic Metabolic Panel  . TSH  . POCT glucose (manual entry)     Requested Prescriptions   Signed Prescriptions Disp Refills  . diclofenac sodium (VOLTAREN) 1 % GEL 100 g 2    Sig: Apply 2 g topically 4 (four) times daily.  Marland Kitchen omeprazole (PRILOSEC) 20 MG capsule 30 capsule 3    Sig: Take 1 capsule (20 mg total) by mouth daily.    Return in about 4 months (around 07/28/2018).  Karle Plumber, MD, FACP

## 2018-03-30 ENCOUNTER — Ambulatory Visit: Payer: Medicare Other | Attending: Internal Medicine

## 2018-03-30 DIAGNOSIS — E1129 Type 2 diabetes mellitus with other diabetic kidney complication: Secondary | ICD-10-CM | POA: Diagnosis not present

## 2018-03-30 DIAGNOSIS — R809 Proteinuria, unspecified: Secondary | ICD-10-CM | POA: Insufficient documentation

## 2018-03-30 DIAGNOSIS — E875 Hyperkalemia: Secondary | ICD-10-CM | POA: Insufficient documentation

## 2018-03-30 DIAGNOSIS — E038 Other specified hypothyroidism: Secondary | ICD-10-CM

## 2018-03-30 NOTE — Progress Notes (Signed)
Patient here for lab visit  

## 2018-03-31 LAB — BASIC METABOLIC PANEL
BUN / CREAT RATIO: 19 (ref 12–28)
BUN: 25 mg/dL (ref 8–27)
CHLORIDE: 104 mmol/L (ref 96–106)
CO2: 24 mmol/L (ref 20–29)
Calcium: 9.4 mg/dL (ref 8.7–10.3)
Creatinine, Ser: 1.35 mg/dL — ABNORMAL HIGH (ref 0.57–1.00)
GFR calc non Af Amer: 38 mL/min/{1.73_m2} — ABNORMAL LOW (ref >59)
GFR, EST AFRICAN AMERICAN: 44 mL/min/{1.73_m2} — AB (ref >59)
GLUCOSE: 109 mg/dL — AB (ref 65–99)
POTASSIUM: 4.1 mmol/L (ref 3.5–5.2)
SODIUM: 142 mmol/L (ref 134–144)

## 2018-03-31 LAB — TSH: TSH: 1.63 u[IU]/mL (ref 0.450–4.500)

## 2018-04-03 ENCOUNTER — Telehealth: Payer: Self-pay

## 2018-04-03 NOTE — Telephone Encounter (Signed)
Patient was called and informed of lab results via interpretor([id number]).

## 2018-04-29 ENCOUNTER — Ambulatory Visit: Payer: Medicare Other | Admitting: Nurse Practitioner

## 2018-05-06 ENCOUNTER — Other Ambulatory Visit: Payer: Self-pay | Admitting: Family Medicine

## 2018-05-06 NOTE — Progress Notes (Signed)
Cardiology Office Note   Date:  05/13/2018   ID:  Nyah Shepherd, DOB 09-May-1941, MRN [id number]  PCP:  [name], MD  Cardiologist:  [name]    No chief complaint on file.     History of Present Illness:  77 y.o. [name] female with HTN, HLD, Low thyroid Dementia and atypical chest pain.   Cath January 2013 with moderate disease Rx medically : Mid LAD 40, ostial D2 90 (small caliber), distal D3 20-30, distal RCA 40-50, mid PDA 30, EF 55-60%.  F/U myovue 07/14/13 normal EF 68%   Seen by PA 08/26/17 ACE increased for BP in addition to diuretic and norvasc stopped due to edema Activity limited by arthritis NSAI's upset stomach and uses omeprazole for this   Some atypical right breast pain Needs refill on Namenda   Past Medical History:  Diagnosis Date  . ANEMIA, CHRONIC   . 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 Breast mass, right 11/23/2015  . CHEST PAIN UNSPECIFIED   . Coronary atherosclerosis of native coronary artery 06/24/2013  . Depression 12/31/2012  . Diabetes mellitus without complication ([place]) 56/2130  . Edema   . GERD (gastroesophageal reflux disease)   . History of TIAs 06/24/2013  . Hx of cardiovascular stress test    Lexiscan Myoview (11/14):  No ischemia, EF 68% (normal study)  . HYPERLIPIDEMIA   . Hypertension   . Hypothyroidism 09/29/2015  . Memory deficit 11/28/2014  . Primary osteoarthritis of both knees 09/29/2015  . PULMONARY NODULE   . THYROID NODULE   . Type 2 diabetes mellitus with microalbuminuria ([place]) 09/29/2015    Past Surgical History:  Procedure Laterality Date  . LEFT HEART CATHETERIZATION WITH CORONARY ANGIOGRAM N/A 09/16/2011   Procedure: LEFT HEART CATHETERIZATION WITH CORONARY ANGIOGRAM;  Surgeon: [name], MD;  Location: [facility] CATH LAB;  Service: Cardiovascular;  Laterality: N/A;  . TUBAL LIGATION    . VAGINAL HYSTERECTOMY  1989     Current Outpatient Medications  Medication Sig Dispense Refill   . acetaminophen (TYLENOL 8 HOUR) 650 MG CR tablet Take 1 tablet (650 mg total) by mouth every 8 (eight) hours as needed for pain. 90 tablet 5  . atorvastatin (LIPITOR) 40 MG tablet Take 1 tablet (40 mg total) by mouth daily. 30 tablet 11  . carvedilol (COREG) 25 MG tablet Take 1 tablet (25 mg total) by mouth 2 (two) times daily with a meal. 60 tablet 11  . clopidogrel (PLAVIX) 75 MG tablet Take 1 tablet (75 mg total) by mouth daily. 90 tablet 3  . diclofenac sodium (VOLTAREN) 1 % GEL Apply 2 g topically 4 (four) times daily. 100 g 2  . hydrochlorothiazide (HYDRODIURIL) 25 MG tablet TAKE 1 BY MOUTH EVERY DAY` 30 tablet 2  . levothyroxine (SYNTHROID) 25 MCG tablet TAKE 1 TABLET BY MOUTH ONCE DAILY 30 tablet 11  . lisinopril (PRINIVIL,ZESTRIL) 20 MG tablet Take 1 tablet (20 mg total) by mouth daily. Dose increase 30 tablet 6  . loratadine (CLARITIN) 10 MG tablet Take 1 tablet (10 mg total) by mouth daily as needed for allergies. 30 tablet 2  . memantine (NAMENDA) 10 MG tablet Take 1 tablet (10 mg total) by mouth daily. 90 tablet 3  . nitroGLYCERIN (NITROSTAT) 0.4 MG SL tablet Place 1 tablet (0.4 mg total) under the tongue every 5 (five) minutes as needed for chest pain. 15 tablet 2  . omeprazole (PRILOSEC) 20 MG capsule Take 1 capsule (20 mg total)  by mouth daily. 30 capsule 3  . ranitidine (ZANTAC) 300 MG tablet Take 1 tablet (300 mg total) by mouth at bedtime. 90 tablet 3  . traMADol (ULTRAM) 50 MG tablet Take 1 tablet (50 mg total) by mouth every 6 (six) hours as needed. 15 tablet 0  . triamcinolone cream (KENALOG) 0.1 % Apply 1 application topically 2 (two) times daily. 30 g 0   No current facility-administered medications for this visit.     Allergies:   Patient has no known allergies.    Social History:  The patient  reports that she has never smoked. She has never used smokeless tobacco. She reports that she does not drink alcohol or use drugs.   Family History:  The patient's family  history includes Cancer in her father; Heart disease in her mother.    ROS:  General:no colds or fevers, no weight changes Skin:no rashes or ulcers HEENT:no blurred vision, no congestion CV:see HPI PUL:see HPI GI:no diarrhea constipation or melena, no indigestion GU:no hematuria, no dysuria MS:no joint pain, no claudication Neuro:no syncope, no lightheadedness Endo:no diabetes, + thyroid disease  Wt Readings from Last 3 Encounters:  05/13/18 137 lb 4 oz (62.3 kg)  05/07/18 150 lb (68 kg)  03/27/18 135 lb 9.6 oz (61.5 kg)     PHYSICAL EXAM: BP (!) 154/80   Pulse 89   Ht 4\' 11"  (1.499 m)   Wt 137 lb 4 oz (62.3 kg)   SpO2 98%   BMI 27.72 kg/m  Affect appropriate Healthy:  appears stated age 89: normal Neck supple with no adenopathy JVP normal no bruits no thyromegaly Lungs clear with no wheezing and good diaphragmatic motion Heart:  S1/S2 no murmur, no rub, gallop or click PMI normal Abdomen: benighn, BS positve, no tenderness, no AAA no bruit.  No HSM or HJR Distal pulses intact with no bruits No edema Neuro non-focal Skin warm and dry No muscular weakness   EKG:   SR with freq PACs. No acute changes. 11/20/17    Recent Labs: 08/26/2017: ALT 10 09/13/2017: Hemoglobin 12.4; Platelets 251 03/30/2018: BUN 25; Creatinine, Ser 1.35; Potassium 4.1; Sodium 142; TSH 1.630    Lipid Panel    Component Value Date/Time   CHOL 204 (H) 06/20/2017 1648   TRIG 217 (H) 06/20/2017 1648   HDL 68 06/20/2017 1648   CHOLHDL 3.0 06/20/2017 1648   CHOLHDL 3.7 05/21/2016 1236   VLDL 43 (H) 05/21/2016 1236   LDLCALC 93 06/20/2017 1648       Other studies Reviewed: Additional studies/ records that were reviewed today include: .  Cardiac cath  Cath [facility] 09/16/11  Medical RX only small diagonal disease  Final Conclusions:  1. Preserved LV function  2. Calcification of the coronaries, with scattered plaque as noted.  3. 90% small D2, in a vessel of 1-1.48mm  4. 50% mid  RCA.  Recommendations:  1. Medical therapy.  Echo 12/06/15 Study Conclusions  - Left ventricle: The cavity size was normal. Wall thickness was   normal. Systolic function was normal. The estimated ejection   fraction was in the range of 60% to 65%. Wall motion was normal;   there were no regional wall motion abnormalities. - Aortic valve: Mildly calcified annulus. Mildly thickened   leaflets.  Nuc study 2017 Study Highlights    Nuclear stress EF: 47%.  This is a low risk study.  The left ventricular ejection fraction is mildly decreased (45-54%).   Breast attenuation no ischemia or infarct EF  47% looks normal suggest echo correlation      ASSESSMENT AND PLAN:  1.  HTN improved with higher dose ACE did not take meds this am repeat BP by me 130 mmHg/80 mmHg  2. HLD continue statin patient declined zetia in past   3.  CAD non obstructive on cath in 2007, continue statin.   Neg nuc study in 2017 and echo stable.  4.  Dementia on namenda  5. DM-2 Discussed low carb diet.  Target hemoglobin A1c is 6.5 or less.  Continue current medications.   F/U with cardiology in a year  Jenkins Rouge

## 2018-05-07 ENCOUNTER — Ambulatory Visit (HOSPITAL_COMMUNITY)
Admission: EM | Admit: 2018-05-07 | Discharge: 2018-05-07 | Disposition: A | Discharge status: Home / Self Care | Payer: Medicare Other | Attending: Family Medicine | Admitting: Family Medicine

## 2018-05-07 ENCOUNTER — Encounter (HOSPITAL_COMMUNITY): Payer: Self-pay

## 2018-05-07 ENCOUNTER — Other Ambulatory Visit: Payer: Self-pay

## 2018-05-07 ENCOUNTER — Ambulatory Visit (INDEPENDENT_AMBULATORY_CARE_PROVIDER_SITE_OTHER): Payer: Medicare Other

## 2018-05-07 DIAGNOSIS — R0789 Other chest pain: Secondary | ICD-10-CM | POA: Diagnosis not present

## 2018-05-07 DIAGNOSIS — R079 Chest pain, unspecified: Secondary | ICD-10-CM | POA: Diagnosis not present

## 2018-05-07 DIAGNOSIS — N644 Mastodynia: Secondary | ICD-10-CM

## 2018-05-07 MED ORDER — TRAMADOL HCL 50 MG PO TABS: 50.0000 mg | ORAL_TABLET | Freq: Four times a day (QID) | ORAL | 0 refills | Status: DC | PRN

## 2018-05-07 NOTE — Discharge Instructions (Signed)
You need to see your primary care doctor for ongoing medical problems.  Your primary care doctor can do the eye referral and should do the referral for the mammogram.  Please call the office today to set up an appointment.  We will send a note to your primary care doctor to alert them. Your chest x-ray was normal. Take tramadol as needed for pain.  This can cause drowsiness.  Take with food.

## 2018-05-07 NOTE — ED Triage Notes (Signed)
Pt needs mammogram and wants a referral for it please. Pt states she has not had a mammogram.

## 2018-05-07 NOTE — ED Provider Notes (Signed)
[place]    CSN: [id number] Arrival date & time: 05/07/18  1005     History   Chief Complaint Chief Complaint  Patient presents with  . Appointment    1030  . upper back pain ( right )  . right breast pain    HPI Wendy Petty is a 77 y.o. female.   HPI  Here with daughter Seen with aid of spanish interpreter Has chest pain for 3 weeks.  Right chest under right breast, "feels deep in chest".  Pain with deep breath and cough.  Scant sputum.  Fever for two days " because of the pain".  Non smoker, no underlying COPD or asthma or lung disease. Feels like it goes to the back and down the right arm.  Some pain with arm movement.   Also wants referral for a mammogram Wants a referral to an eye doctor Explained that this is beyond the scope of practice for an urgent care. Uncertain if she has a PCP, there are many notes in record, uncertain when next follow up is scheduled No accident or injury or fall or overuse No cols symptoms or runny nose/sore throat No known exposure to infection    Past Medical History:  Diagnosis Date  . ANEMIA, CHRONIC   . 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 Breast mass, right 11/23/2015  . CHEST PAIN UNSPECIFIED   . Coronary atherosclerosis of native coronary artery 06/24/2013  . Depression 12/31/2012  . Diabetes mellitus without complication ([place]) 79/8921  . Edema   . GERD (gastroesophageal reflux disease)   . History of TIAs 06/24/2013  . Hx of cardiovascular stress test    Lexiscan Myoview (11/14):  No ischemia, EF 68% (normal study)  . HYPERLIPIDEMIA   . Hypertension   . Hypothyroidism 09/29/2015  . Memory deficit 11/28/2014  . Primary osteoarthritis of both knees 09/29/2015  . PULMONARY NODULE   . THYROID NODULE   . Type 2 diabetes mellitus with microalbuminuria ([place]) 09/29/2015    Patient Active Problem List   Diagnosis Date Noted  . Breast mass, right 11/23/2015  . Asymmetrical hearing loss of  left ear 11/16/2015  . Ear noise/buzzing 10/30/2015  . Chronic low back pain 10/30/2015  . Type 2 diabetes mellitus with microalbuminuria ([place]) 09/29/2015  . Poorly fitting dentures 09/29/2015  . Hypothyroidism 09/29/2015  . Primary osteoarthritis of both knees 09/29/2015  . Spondylolisthesis 07/19/2015  . Overweight (BMI 25.0-29.9) 06/29/2015  . Memory deficit 11/28/2014  . Coronary atherosclerosis of native coronary artery 06/24/2013  . History of TIAs 06/24/2013  . GERD (gastroesophageal reflux disease) 06/24/2013  . Depression 12/31/2012  . THYROID NODULE 02/14/2009  . Elevated lipids 02/14/2009  . ANEMIA, CHRONIC 02/14/2009  . Essential hypertension 02/14/2009  . PULMONARY NODULE 02/14/2009    Past Surgical History:  Procedure Laterality Date  . LEFT HEART CATHETERIZATION WITH CORONARY ANGIOGRAM N/A 09/16/2011   Procedure: LEFT HEART CATHETERIZATION WITH CORONARY ANGIOGRAM;  Surgeon: [name], MD;  Location: [facility] CATH LAB;  Service: Cardiovascular;  Laterality: N/A;  . TUBAL LIGATION    . VAGINAL HYSTERECTOMY  1989    OB History   None      Home Medications    Prior to Admission medications   Medication Sig Start Date End Date Taking? Authorizing Provider  acetaminophen (TYLENOL 8 HOUR) 650 MG CR tablet Take 1 tablet (650 mg total) by mouth every 8 (eight) hours as needed for pain. 06/20/17   Karle Plumber  B, MD  atorvastatin (LIPITOR) 40 MG tablet Take 1 tablet (40 mg total) by mouth daily. 06/20/17   [name], MD  carvedilol (COREG) 25 MG tablet Take 1 tablet (25 mg total) by mouth 2 (two) times daily with a meal. 10/29/17   [name], MD  clopidogrel (PLAVIX) 75 MG tablet Take 1 tablet (75 mg total) by mouth daily. 06/20/17   [name], MD  diclofenac sodium (VOLTAREN) 1 % GEL Apply 2 g topically 4 (four) times daily. 03/27/18   [name], MD  hydrochlorothiazide (HYDRODIURIL) 25 MG tablet Take 1 tablet (25 mg total) by  mouth daily. Stop the Norvasc 09/19/17   [name], MD  levothyroxine (SYNTHROID) 25 MCG tablet TAKE 1 TABLET BY MOUTH ONCE DAILY 06/20/17   [name], MD  lisinopril (PRINIVIL,ZESTRIL) 20 MG tablet Take 1 tablet (20 mg total) by mouth daily. Dose increase 11/20/17   [name], MD  loratadine (CLARITIN) 10 MG tablet Take 1 tablet (10 mg total) by mouth daily as needed for allergies. 08/14/17   [name], MD  memantine (NAMENDA) 10 MG tablet Take 1 tablet (10 mg total) by mouth daily. 08/14/17   [name], MD  nitroGLYCERIN (NITROSTAT) 0.4 MG SL tablet Place 1 tablet (0.4 mg total) under the tongue every 5 (five) minutes as needed for chest pain. 06/20/17   [name], MD  omeprazole (PRILOSEC) 20 MG capsule Take 1 capsule (20 mg total) by mouth daily. 03/27/18   [name], MD  ranitidine (ZANTAC) 300 MG tablet Take 1 tablet (300 mg total) by mouth at bedtime. 06/20/17   [name], MD  traMADol (ULTRAM) 50 MG tablet Take 1 tablet (50 mg total) by mouth every 6 (six) hours as needed. 05/07/18   Raylene Everts, MD  triamcinolone cream (KENALOG) 0.1 % Apply 1 application topically 2 (two) times daily. 11/20/17   [name], MD  cetirizine (ZYRTEC) 10 MG tablet Take 10 mg by mouth daily.    12/31/12  [provider]  losartan-hydrochlorothiazide (HYZAAR) 100-25 MG per tablet Take 1 tablet by mouth daily.    12/31/12  [provider]    Family History Family History  Problem Relation Age of Onset  . Heart disease Mother   . Cancer Father        a tumor ruptured in his abd.    Social History Social History   Tobacco Use  . Smoking status: Never Smoker  . Smokeless tobacco: Never Used  Substance Use Topics  . Alcohol use: No    Alcohol/week: 0.0 standard drinks  . Drug use: No     Allergies   Patient has no known allergies.   Review of Systems Review of Systems  Constitutional: Positive for  fever. Negative for chills.  HENT: Positive for trouble swallowing. Negative for ear pain and sore throat.        "food gets stuck X 3 years"  Eyes: Negative for pain and visual disturbance.  Respiratory: Positive for cough. Negative for shortness of breath.   Cardiovascular: Positive for chest pain. Negative for palpitations.  Gastrointestinal: Negative for abdominal pain and vomiting.  Genitourinary: Negative for dysuria and hematuria.  Musculoskeletal: Positive for arthralgias and back pain.  Skin: Negative for color change and rash.  Neurological: Negative for seizures and syncope.  All other systems reviewed and are negative.    Physical Exam Triage Vital Signs ED Triage Vitals  Enc Vitals Group     BP 05/07/18 1026 131/84     Pulse Rate 05/07/18 1026 68     Resp 05/07/18 1026 16     Temp 05/07/18 1026 97.8 F (36.6 C)     Temp src --      SpO2 05/07/18 1026 98 %     Weight 05/07/18 1028 150 lb (68 kg)     Height --      Head Circumference --      Peak Flow --      Pain Score --      Pain Loc --      Pain Edu? --      Excl. in [place]? --    No data found.  Updated Vital Signs BP 131/84   Pulse 68   Temp 97.8 F (36.6 C)   Resp 16   Wt 68 kg   SpO2 98%   BMI 30.30 kg/m    Visual Acuity Right Eye Distance:   Left Eye Distance:   Bilateral Distance:    Right Eye Near:   Left Eye Near:    Bilateral Near:     Physical Exam  Constitutional: She appears well-developed and well-nourished. No distress.  HENT:  Head: Normocephalic and atraumatic.  Mouth/Throat: Oropharynx is clear and moist.  Eyes: Pupils are equal, round, and reactive to light. Conjunctivae are normal.  Neck: Normal range of motion. Neck supple.  Cardiovascular: Normal rate, regular rhythm and normal heart sounds.  Pulmonary/Chest: Effort normal and breath sounds normal. No respiratory distress. She has no wheezes. She has no rales.  Abdominal: Soft. Bowel sounds are normal. She exhibits no  distension. There is no tenderness.  nontender  Genitourinary:  Genitourinary Comments: Breasts exam normal, axilla clear  Musculoskeletal: Normal range of motion. She exhibits no edema.  Lymphadenopathy:    She has no cervical adenopathy.  Neurological: She is alert.  Skin: Skin is warm and dry.  Psychiatric:  Quiet demeanor     UC Treatments / Results  Labs (all labs ordered are listed, but only abnormal results are displayed) Labs Reviewed - No data to display  EKG None  Radiology Dg Chest 2 View  Result Date: 05/07/2018 CLINICAL DATA:  Chest pain. EXAM: CHEST - 2 VIEW COMPARISON:  Radiographs of September 13, 2017. FINDINGS: Stable cardiomegaly. Atherosclerosis of thoracic aorta is noted. No pneumothorax or pleural effusion is noted. Both lungs are clear. The visualized skeletal structures are unremarkable. IMPRESSION: No active cardiopulmonary disease. Aortic Atherosclerosis (ICD10-I70.0). Electronically Signed   By: Marijo Conception, M.D.   On: 05/07/2018 11:45    Procedures Procedures (including critical care time)  Medications Ordered in UC Medications - No data to display  Initial Impression / Assessment and Plan / UC Course  I have reviewed the triage vital signs and the nursing notes.  Pertinent labs & imaging results that were available during my care of the patient were reviewed by me and considered in my medical decision making (see chart for details).     Discussed with patient, again with an interpreter, I do not have a cause for her right chest and back pain.  I do not think that her right breast pain is caused by anything serious.  There is no palpable abnormality.  She had a normal mammogram in 2017.She did have a Tomo and ultrasound performed because of a cyst.  I explained that optimally this should be ordered through her primary care doctor and have recommended  call today to make an appointment.  She states that the pain is significant at times.  She does  have diabetes and she has renal failure with her last GFR being 44.  I do not believe anti-inflammatories are appropriate for her.  I will give her a limited number of tramadol for pain. Final Clinical Impressions(s) / UC Diagnoses   Final diagnoses:  Pain of right breast  Right-sided chest wall pain     Discharge Instructions     You need to see your primary care doctor for ongoing medical problems.  Your primary care doctor can do the eye referral and should do the referral for the mammogram.  Please call the office today to set up an appointment.  We will send a note to your primary care doctor to alert them. Your chest x-ray was normal. Take tramadol as needed for pain.  This can cause drowsiness.  Take with food.    ED Prescriptions    Medication Sig Dispense Auth. Provider   traMADol (ULTRAM) 50 MG tablet Take 1 tablet (50 mg total) by mouth every 6 (six) hours as needed. 15 tablet Raylene Everts, MD     Controlled Substance Prescriptions [place] Controlled Substance Registry consulted? Not Applicable  Raylene Everts, MD 05/07/18 1234

## 2018-05-07 NOTE — ED Notes (Signed)
Bed: UC01 Expected date:  Expected time:  Means of arrival:  Comments: 

## 2018-05-12 ENCOUNTER — Other Ambulatory Visit: Payer: Self-pay | Admitting: Internal Medicine

## 2018-05-12 DIAGNOSIS — I10 Essential (primary) hypertension: Secondary | ICD-10-CM

## 2018-05-13 ENCOUNTER — Ambulatory Visit (INDEPENDENT_AMBULATORY_CARE_PROVIDER_SITE_OTHER): Payer: Medicare Other | Admitting: Cardiovascular Disease

## 2018-05-13 ENCOUNTER — Encounter: Payer: Self-pay | Admitting: Cardiovascular Disease

## 2018-05-13 VITALS — BP 130/80 | HR 89 | Ht 59.0 in | Wt 137.2 lb

## 2018-05-13 DIAGNOSIS — I10 Essential (primary) hypertension: Secondary | ICD-10-CM | POA: Diagnosis not present

## 2018-05-13 DIAGNOSIS — I2510 Atherosclerotic heart disease of native coronary artery without angina pectoris: Secondary | ICD-10-CM

## 2018-05-13 DIAGNOSIS — E785 Hyperlipidemia, unspecified: Secondary | ICD-10-CM | POA: Diagnosis not present

## 2018-05-13 NOTE — Patient Instructions (Addendum)

## 2018-06-18 ENCOUNTER — Ambulatory Visit: Payer: Medicare Other | Attending: Internal Medicine | Admitting: Internal Medicine

## 2018-06-18 ENCOUNTER — Encounter: Payer: Self-pay | Admitting: Internal Medicine

## 2018-06-18 VITALS — BP 175/76 | HR 72 | Temp 97.8°F | Resp 16 | Wt 136.0 lb

## 2018-06-18 DIAGNOSIS — Z9889 Other specified postprocedural states: Secondary | ICD-10-CM | POA: Insufficient documentation

## 2018-06-18 DIAGNOSIS — J029 Acute pharyngitis, unspecified: Secondary | ICD-10-CM | POA: Diagnosis present

## 2018-06-18 DIAGNOSIS — J40 Bronchitis, not specified as acute or chronic: Secondary | ICD-10-CM | POA: Diagnosis not present

## 2018-06-18 DIAGNOSIS — E119 Type 2 diabetes mellitus without complications: Secondary | ICD-10-CM | POA: Diagnosis not present

## 2018-06-18 DIAGNOSIS — D649 Anemia, unspecified: Secondary | ICD-10-CM | POA: Diagnosis not present

## 2018-06-18 DIAGNOSIS — I2510 Atherosclerotic heart disease of native coronary artery without angina pectoris: Secondary | ICD-10-CM | POA: Diagnosis not present

## 2018-06-18 DIAGNOSIS — Z23 Encounter for immunization: Secondary | ICD-10-CM | POA: Diagnosis not present

## 2018-06-18 DIAGNOSIS — E038 Other specified hypothyroidism: Secondary | ICD-10-CM | POA: Diagnosis not present

## 2018-06-18 DIAGNOSIS — I10 Essential (primary) hypertension: Secondary | ICD-10-CM

## 2018-06-18 DIAGNOSIS — E1129 Type 2 diabetes mellitus with other diabetic kidney complication: Secondary | ICD-10-CM | POA: Diagnosis not present

## 2018-06-18 DIAGNOSIS — M170 Bilateral primary osteoarthritis of knee: Secondary | ICD-10-CM | POA: Diagnosis not present

## 2018-06-18 DIAGNOSIS — R413 Other amnesia: Secondary | ICD-10-CM

## 2018-06-18 DIAGNOSIS — E785 Hyperlipidemia, unspecified: Secondary | ICD-10-CM | POA: Diagnosis not present

## 2018-06-18 DIAGNOSIS — R809 Proteinuria, unspecified: Secondary | ICD-10-CM

## 2018-06-18 DIAGNOSIS — K219 Gastro-esophageal reflux disease without esophagitis: Secondary | ICD-10-CM | POA: Diagnosis not present

## 2018-06-18 DIAGNOSIS — Z9851 Tubal ligation status: Secondary | ICD-10-CM | POA: Insufficient documentation

## 2018-06-18 DIAGNOSIS — Z79899 Other long term (current) drug therapy: Secondary | ICD-10-CM | POA: Diagnosis not present

## 2018-06-18 LAB — POCT GLYCOSYLATED HEMOGLOBIN (HGB A1C): HbA1c, POC (prediabetic range): 6.2 % (ref 5.7–6.4)

## 2018-06-18 LAB — GLUCOSE, POCT (MANUAL RESULT ENTRY): POC GLUCOSE: 121 mg/dL — AB (ref 70–99)

## 2018-06-18 MED ORDER — OMEPRAZOLE 20 MG PO CPDR: 20.0000 mg | DELAYED_RELEASE_CAPSULE | Freq: Every day | ORAL | 6 refills | Status: DC

## 2018-06-18 MED ORDER — LISINOPRIL 20 MG PO TABS: 20.0000 mg | ORAL_TABLET | Freq: Every day | ORAL | 6 refills | Status: DC

## 2018-06-18 MED ORDER — MEMANTINE HCL 10 MG PO TABS: 10.0000 mg | ORAL_TABLET | Freq: Every day | ORAL | 3 refills | Status: DC

## 2018-06-18 MED ORDER — LEVOTHYROXINE SODIUM 25 MCG PO TABS: ORAL_TABLET | ORAL | 11 refills | Status: DC

## 2018-06-18 MED ORDER — CARVEDILOL 25 MG PO TABS: 25.0000 mg | ORAL_TABLET | Freq: Two times a day (BID) | ORAL | 11 refills | Status: DC

## 2018-06-18 MED ORDER — DICLOFENAC SODIUM 1 % TD GEL: 2.0000 g | Freq: Four times a day (QID) | TRANSDERMAL | 2 refills | Status: DC

## 2018-06-18 MED ORDER — CLOPIDOGREL BISULFATE 75 MG PO TABS: 75.0000 mg | ORAL_TABLET | Freq: Every day | ORAL | 3 refills | Status: DC

## 2018-06-18 MED ORDER — HYDROCHLOROTHIAZIDE 25 MG PO TABS: ORAL_TABLET | ORAL | 3 refills | Status: DC

## 2018-06-18 MED ORDER — AZITHROMYCIN 250 MG PO TABS: ORAL_TABLET | ORAL | 0 refills | Status: DC

## 2018-06-18 MED ORDER — ATORVASTATIN CALCIUM 40 MG PO TABS: 40.0000 mg | ORAL_TABLET | Freq: Every day | ORAL | 11 refills | Status: DC

## 2018-06-18 NOTE — Patient Instructions (Signed)
Vacuna antineumoccica conjugada (PCV13): lo que debe saber (Pneumococcal Conjugate Vaccine [PCV13] What You Need to Know) 1. Por qu vacunarse? La vacunacin puede proteger a los nios y a los adultos de la enfermedad neumoccica. La enfermedad neumoccica es causada por una bacteria que puede contagiarse de [place] persona a otra por contacto directo. Puede provocar infecciones en los odos y tambin infecciones ms graves en:  los pulmones (neumona),  la sangre (bacteriemia), y  las membranas que cubren el cerebro y la mdula espinal (meningitis). La neumona neumoccica es ms frecuente en los adultos. La meningitis neumoccica puede causar sordera y dao cerebral, y Simonne Maffucci a aproximadamente 1 de cada 10 nios que contraen la enfermedad. Cualquier persona puede Emergency planning/management officer enfermedad neumoccica, pero los nios menores de 2 aos y los adultos mayores de 60 aos, las personas con determinadas enfermedades y los fumadores son los que corren un mayor riesgo. Antes de que existiera una vacuna, cada ao, en [place] se registraron los siguientes casos:  ms de 700 casos de meningitis,  unas 13000 infecciones de la [place],  cerca de 5 millones de infecciones del odo, y  alrededor de 200 muertes por la enfermedad neumoccica en nios menores de 5 aos. A partir de que la vacuna estuvo disponible, la enfermedad neumoccica grave en los nios ha disminuido un 88%. Cada ao, mueren alrededor de [employer] a causa de la enfermedad [employer] [place]. El tratamiento de las infecciones neumoccicas con penicilina y otros frmacos no es tan eficaz como sola serlo, dado que algunas cepas de la bacteria que causa la enfermedad se han vuelto resistentes a estos frmacos. Esto hace que la prevencin de la enfermedad mediante la vacunacin sea an ms importante. 2. Wendy Petty PCV13 La vacuna antineumoccica conjugada (llamada PCV13) protege contra 13tipos de bacterias  neumoccicas. La PCV13 se administra rutinariamente a los nios a los 2, 4, 6 y de 12 a 15 meses. Tambin se recomienda para los nios y Enoch de 2 a 52aos con ciertas enfermedades y para todos los adultos de 65 aos o ms. Su mdico le puede dar ms detalles. 3. Algunas personas no deben recibir la vacuna Cualquiera que haya tenido alguna vez una reaccin alrgica potencialmente mortal a una dosis de esta vacuna, o a una vacuna anterior neumoccica llamada PCV7, o a cualquier vacuna que contiene toxoide de difteria (por ejemplo, DTaP), no debe recibir la PCV13. Las personas que tengan Set designer graves a algn componente de la vacuna PCV13 no deben recibir esa vacuna. Informe al mdico si la persona que se vacunar sufre algn tipo de alergia grave. Si la persona prevista para la vacunacin est enferma, el mdico podra decidir reprogramar la vacuna para Administrator, arts. 4. Riesgos de [place] reaccin a la vacuna Con cualquier medicamento, incluidas las vacunas, existe la posibilidad de que [employer]. Estas suelen ser leves y desaparecer por s solas, pero tambin es posible que se presenten reacciones graves. Los problemas informados despus de Glass blower/designer la PCV13 variaban con la edad y la dosis de la serie. Los problemas ms frecuentes informados en los nios fueron los siguientes:  Alrededor de la mitad se puso somnoliento despus de la inyeccin, tuvo una prdida transitoria del apetito, enrojecimiento o sinti dolor con la palpacin en donde se le aplic la inyeccin.  Aproximadamente 1 de cada 3 tuvo hinchazn en donde se le aplic la inyeccin.  Aproximadamente 1 de cada 3 tuvo fiebre baja y alrededor de 1de cada 20tuvo fiebre de ms  de 102,37F (39C).  Hasta 8 de cada 10 nios se ponen fastidiosos o irritables. Los adultos han Airline pilot, enrojecimiento e hinchazn donde se les aplic la vacuna; tambin fiebre baja, fatiga, dolor de cabeza, escalofros o dolor muscular. Los nios  pequeos, que reciben la vacuna PCV13 y la vacuna antigripal inactivada al mismo tiempo, pueden tener un mayor riesgo de sufrir convulsiones causadas por fiebre. Consulte al mdico para obtener ms informacin. Problemas que podran ocurrir despus de cualquier vacuna:  Las personas a veces se desmayan despus de un procedimiento mdico, incluida la vacunacin. Permanecer sentado o recostado durante 10minutos puede ayudar a [employer]ill Lynch y las lesiones causadas por las cadas. Informe al mdico si se siente mareado, tiene cambios en la visin o zumbidos en los odos.  Algunos nios mayores y adultos sienten un dolor intenso en el hombro y tienen dificultad para mover el brazo donde se coloc la vacuna. Esto sucede con muy poca frecuencia.  Cualquier medicamento puede causar una reaccin alrgica grave. Dichas reacciones son Orlene Erm poco frecuentes con una vacuna (se calcula que menos de 1en un milln de dosis) y se producen unos minutos a unas horas despus de la vacunacin. Al igual que con cualquier [employer], existe una probabilidad muy pequea de que una vacuna cause una lesin grave o la muerte. Se controla permanentemente la seguridad de las vacunas. Para obtener ms informacin, visite: http://www.aguilar.[email or web address] 5. Qu pasa si hay una reaccin grave? A qu signos debo estar atento?  Observe todo lo que le preocupe, como signos de una reaccin alrgica grave, fiebre muy alta o comportamiento fuera de lo normal. Los signos de una reaccin alrgica grave pueden incluir ronchas, hinchazn de la cara y la garganta, dificultad para respirar, latidos cardacos acelerados, mareos y debilidad, generalmente en el lapso de unos minutos a unas horas despus de la vacunacin. Qu debo hacer?  Si usted piensa que se trata de una reaccin alrgica grave o de otra emergencia que no puede esperar, llame al 911 o lleve a la persona al hospital ms cercano. De lo contrario, llame al [employer]. Las  reacciones deben informarse al Sistema de Informacin sobre Efectos Adversos de las Clinical biochemist (Vaccine Adverse Event Reporting System, VAERS). El mdico debe presentar este informe, o bien puede hacerlo usted mismo a travs del sitio web de VAERS, en [email or web address], o llamando al [phone]. VAERS no brinda recomendaciones mdicas. 6. [place] Compensacin de Daos por [place] de Compensacin de Daos por Clinical biochemist (National Vaccine Injury Compensation Program, VICP) es un programa federal que fue creado para Patent examiner a las personas que puedan haber sufrido daos al recibir ciertas vacunas. Aquellas personas que consideren que han sufrido un dao como consecuencia de una vacuna y [place] saber ms acerca del programa y de cmo presentar un reclamo, pueden llamar al [phone] o visitar el sitio web del VICP en [email or web address]. Hay un lmite de tiempo para presentar un reclamo de compensacin. 7. Cmo puedo obtener ms informacin?  Pregntele al mdico. Este puede darle el prospecto de la vacuna o recomendarle otras fuentes de informacin.  Comunquese con el servicio de salud de su localidad o su estado.  Comunquese con los Centros para el Control y la Prevencin de Probation officer for Disease Control and Prevention, CDC): ? Llame al [phone] (1-800-CDC-INFO) o ? visite el sitio web [employer] en http://[email or web address]/. Declaracin de informacin de la vacuna Vacuna PCV13 (07/14/2014) Esta informacin no tiene Marine scientist el consejo del mdico. Chief Strategy Officer  de hacerle al mdico cualquier pregunta que tenga. Document Released: 02/12/2008 Document Revised: 09/16/2014 Document Reviewed: 07/21/2014 Elsevier Interactive Patient Education  2017 [place] Td (contra la difteria y el ttanos): Lo que debe saber (Td Vaccine Margaretmary Eddy and Diphtheria]: What You Need to Know) 1. Por qu vacunarse? El ttanos y la  difteria son enfermedades muy graves. Son Futures trader frecuentes en los [place] actualmente, pero las personas que se infectan suelen tener complicaciones graves. La vacuna Td se [place] para proteger a los adolescentes y a los adultos de ambas enfermedades. Tanto el ttanos como la difteria son infecciones causadas por bacterias. La difteria se transmite de persona a persona a travs de la tos o el estornudo. La bacteria que causa el ttanos entra al cuerpo a travs de cortes, raspones o heridas. El TTANOS (trismo) provoca entumecimiento y Writer dolorosa de los msculos, por lo general, en todo el cuerpo.  Puede causar el endurecimiento de los msculos de la cabeza y el cuello, de modo que impide abrir la boca, tragar y en algunos casos, Ambulance person. El ttanos es causa de muerte en aproximadamente 1de cada 10personas que contraen la infeccin, incluso despus de que reciben la mejor atencin mdica. La DIFTERIA puede hacer que se forme una membrana gruesa en la parte posterior de la garganta.  Puede causar problemas respiratorios, parlisis, insuficiencia cardaca e incluso la muerte. Antes de las vacunas, en los [place] se informaban 200000 casos de difteria y cientos de casos de ttanos cada ao. Desde que comenz la vacunacin, los informes de casos de ambas enfermedades se han reducido en un 99%. 2. Wendy Petty Td La vacuna Td protege a adolescentes y adultos contra el ttanos y la difteria. La vacuna Td habitualmente se aplica como dosis de refuerzo cada 10aos, pero tambin puede administrarse antes si la persona sufre una [place] o herida sucia y grave. A veces, en lugar de la vacuna Td, se recomienda una vacuna llamada Tdap, que protege contra la tosferina, adems de proteger contra el ttanos y la difteria. El mdico o la persona que le aplique la vacuna puede darle ms informacin al [employer]. La Td puede administrarse de manera segura simultneamente con otras vacunas. 3. Algunas  personas no deben recibir la vacuna  Una persona que alguna vez ha tenido una reaccin alrgica potencialmente mortal a una dosis anterior de cualquier vacuna contra el ttanos o la difteria, O que tenga una alergia grave a cualquier parte de esta vacuna, no debe recibir la vacuna Td. Informe a la persona que le aplica la vacuna si usted tiene cualquier alergia grave.  Consulte con su mdico si: ? tuvo hinchazn o dolor intenso despus de recibir cualquier vacuna contra la difteria o el ttanos, ? alguna vez ha sufrido el sndrome de Curator, ? no se siente Pharmacologist en que se ha programado la vacuna.  4. Riesgos de [place] reaccin a la vacuna Con cualquier medicamento, incluyendo las vacunas, existe la posibilidad de que aparezcan efectos secundarios. Suelen ser leves y desaparecen por s solos. Tambin son posibles las reacciones graves, pero en raras ocasiones. [place] personas a las que se les aplica la vacuna Td no tienen ningn problema. Problemas leves despus de la vacuna Td: (No interfirieron en otras actividades)  Dolor en el lugar donde se aplic la vacuna (alrededor de 8de cada 10personas)  Enrojecimiento o hinchazn en el lugar donde se aplic la vacuna (alrededor de 1de cada 4personas)  Fiebre leve (poco frecuente)  Dolor de Pensions consultant (alrededor de 1de cada 4personas)  Cansancio (alrededor de 1de cada 4personas) Problemas moderados despus de la vacuna Td: (Interfirieron en otras actividades, pero no requirieron atencin mdica)  Systems analyst superior a 102F (38,8C) (poco frecuente) Problemas graves despus de la vacuna Td: (Impidieron Optometrist las actividades habituales; requirieron atencin mdica)  Clinical cytogeneticist, dolor intenso, sangrado o enrojecimiento en el brazo en que se aplic la vacuna (poco frecuente). Problemas que podran ocurrir despus de cualquier vacuna:  Las personas a veces se desmayan despus de un procedimiento mdico, incluida la  vacunacin. Si permanece sentado o recostado durante 15 minutos puede ayudar a [employer] y las lesiones causadas por las cadas. Informe al mdico si se siente mareado, tiene cambios en la visin o zumbidos en los odos.  Algunas personas sienten un dolor intenso en el hombro y tienen dificultad para mover el brazo donde se coloc la vacuna. Esto sucede con muy poca frecuencia.  Cualquier medicamento puede causar una reaccin alrgica grave. Dichas reacciones son Orlene Erm poco frecuentes con una vacuna (se calcula que menos de 1en un milln de dosis) y se producen de unos minutos a unas horas despus de Writer. Al igual que con cualquier [employer], existe una probabilidad muy remota de que una vacuna cause una lesin grave o la [place]. Se controla permanentemente la seguridad de las vacunas. Para obtener ms informacin, visite: http://www.aguilar.org/. 5. Qu pasa si hay una reaccin grave? A qu signos debo estar atento?  Observe todo lo que le preocupe, como signos de una reaccin alrgica grave, fiebre muy alta o comportamiento fuera de lo normal. Los signos de una reaccin alrgica grave pueden incluir ronchas, hinchazn de la cara y la garganta, dificultad para respirar, latidos cardacos acelerados, mareos y debilidad. Generalmente, estos comenzaran entre unos pocos minutos y algunas horas despus de la vacunacin. Qu debo hacer?  Si usted piensa que se trata de una reaccin alrgica grave o de otra emergencia que no puede esperar, llame al 911 o dirjase al hospital ms cercano. Sino, llame a su mdico.  Despus, la reaccin debe informarse al Sistema de Informacin sobre Efectos Adversos de las [place] (Vaccine Adverse Event Reporting System, VAERS). Su mdico puede presentar este informe, o puede hacerlo usted mismo a travs del sitio web de VAERS, en [email or web address], o llamando al [phone]. VAERS no brinda recomendaciones mdicas. 6. [place] Compensacin de Daos por [place] de Compensacin de Daos por Clinical biochemist (National Vaccine Injury Compensation Program, VICP) es un programa federal que fue creado para Patent examiner a las personas que puedan haber sufrido daos al recibir ciertas vacunas. Aquellas personas que consideren que han sufrido un dao como consecuencia de una vacuna y [place] saber ms acerca del programa y de cmo presentar Ra[place]hel [place], pueden llamar al [phone] o visitar su sitio web en [email or web address]. Hay un lmite de tiempo para presentar un reclamo de compensacin. 7. Cmo puedo obtener ms informacin?  Consulte a su mdico. Este puede darle el prospecto de la vacuna o recomendarle otras fuentes de informacin.  Comunquese con el servicio de salud de su localidad o su estado.  Comunquese con los Centros para Building surveyor y la Prevencin de Probation officer for Disease Control and Prevention , CDC). ? Llame al 8081032963 (1-800-CDC-INFO). ? Visite el sitio Biomedical engineer en [email or web address]/. Declaracin de informacin sobre la vacuna contra la difteria y el ttanos (Td) de los CDC (12/19/15) Esta informacin no tiene Marine scientist el consejo del  mdico. Asegrese de hacerle al mdico cualquier pregunta que tenga. Document Released: 12/12/2008 Document Revised: 09/16/2014 Document Reviewed: 12/19/2015 Elsevier Interactive Patient Education  2017 [place].  Influenza Virus Vaccine injection (Fluarix) Qu es este medicamento? La VACUNA ANTIGRIPAL ayuda a disminuir el riesgo de contraer la influenza, tambin conocida como la gripe. La vacuna solo ayuda a protegerle contra algunas cepas de influenza. Esta vacuna no ayuda a reducir Catering manager de contraer influenza pandmica H1N1. Este medicamento puede ser utilizado para otros usos; si tiene alguna pregunta consulte con su proveedor de atencin mdica o con su farmacutico. MARCAS COMUNES: Fluarix,  Fluzone Qu le debo informar a mi profesional de la salud antes de tomar este medicamento? Necesita saber si usted presenta alguno de los siguientes problemas o situaciones: -trastorno de sangrado como hemofilia -fiebre o infeccin -sndrome de Guillain-Barre u otros problemas neurolgicos -problemas del sistema inmunolgico -infeccin por el virus de la inmunodeficiencia humana (VIH) o SIDA -niveles bajos de plaquetas en la sangre -esclerosis mltiple -una Risk analyst o inusual a las vacunas antigripales, a los huevos, protenas de pollo, al ltex, a la gentamicina, a otros medicamentos, alimentos, colorantes o conservantes -si est embarazada o buscando quedar embarazada -si est amamantando a un beb Cmo debo utilizar este medicamento? Esta vacuna se administra mediante inyeccin por va intramuscular. Lo administra un profesional de [employer]	[place]. Recibir una copia de informacin escrita sobre la vacuna antes de cada vacuna. Asegrese de leer este folleto cada vez cuidadosamente. Este folleto puede cambiar con frecuencia. Hable con su pediatra para informarse acerca del uso de este medicamento en nios. Puede requerir atencin especial. Sobredosis: Pngase en contacto inmediatamente con un centro toxicolgico o una sala de urgencia si usted cree que haya tomado demasiado medicamento. ATENCIN: [employer] es solo para usted. No comparta este medicamento con nadie. Qu sucede si me olvido de una dosis? No se aplica en este caso. Qu puede interactuar con este medicamento? -quimioterapia o radioterapia -medicamentos que suprimen el sistema inmunolgico, tales como etanercept, anakinra, infliximab y adalimumab -medicamentos que tratan o previenen cogulos sanguneos, como warfarina -fenitona -medicamentos esteroideos, como la prednisona o la cortisona -teofilina -vacunas Puede ser que esta lista no menciona todas las posibles interacciones. Informe a su profesional de [employer] Corporation de [employer] productos a base de hierbas, medicamentos de [place] o suplementos nutritivos que est tomando. Si usted fuma, consume bebidas alcohlicas o si utiliza drogas ilegales, indqueselo tambin a su profesional de [employer]	[place]. Algunas sustancias pueden interactuar con su medicamento. A qu debo estar atento al usar [employer]? Informe a su mdico o a Barrister's clerk de la [employer] todos los efectos secundarios que persistan despus de 3 das. Llame a su proveedor de atencin mdica si se presentan sntomas inusuales dentro de las 6 semanas posteriores a la vacunacin. Es posible que todava pueda contraer la gripe, pero la enfermedad no ser tan fuerte como normalmente. No puede contraer la gripe de esta vacuna. La vacuna antigripal no le protege contra resfros u otras enfermedades que pueden causar [place]. Debe vacunarse cada ao. Qu efectos secundarios puedo tener al [employer] este medicamento? Efectos secundarios que debe informar a su mdico o a Barrister's clerk de la salud tan pronto como sea posible: -Chief of Staff como erupcin cutnea, picazn o urticarias, hinchazn de la cara, labios o lengua Efectos secundarios que, por lo general, no requieren atencin mdica (debe informarlos a su mdico o a su profesional de la salud si persisten o si son molestos): -  fiebre -dolor de cabeza -molestias y dolores musculares -dolor, sensibilidad, enrojecimiento o Estate agent de la inyeccin -cansancio o debilidad Puede ser que esta lista no menciona todos los posibles efectos secundarios. Comunquese a su mdico por asesoramiento mdico [employer]. Usted puede informar los efectos secundarios a la FDA por telfono al 1-800-FDA-1088. Dnde debo guardar mi medicina? Esta vacuna se administra solamente en clnicas, farmacias, consultorio mdico u otro consultorio de un profesional de la salud y no Sports coach en su domicilio. ATENCIN: Este  folleto es un resumen. Puede ser que no cubra toda la posible informacin. Si usted tiene preguntas acerca de esta medicina, consulte con su mdico, su farmacutico o su profesional de Technical sales engineer.  2018 Elsevier/Gold Standard (2010-02-27 15:31:40)

## 2018-06-18 NOTE — Progress Notes (Addendum)
Patient ID: Wendy Petty, female    DOB: 1941/02/23  MRN: [id number]  CC: Hospitalization Follow-up (ED)   Subjective: Wendy Petty is a 77 y.o. female who presents for chronic ds management Her concerns today include:  Hx ofHTN, DM, hypothyroid, GERD, coronary atherosclerosis, HL,lacunar CVA,OA back, hip and knees  C/o having "cold and allergies" x 4 days.  Current symptoms include sore throat, dry cough, slight short of breath and rhinorrhea of clear mucous and subjective fever.  Took some Tylenol and OTC cold remedy.  Does not recall the name of the latter.  HTN:    Out of Lisinopril and HCTZ x 2 days.  Reports compliance with Coreg.  Reports compliance with salt restriction.  Denies any chest pains.  No lower extremity edema.  GERD:  Request RF on Omeprazole  OA knees: Voltaren gel prescribed on last visit.  Initially patient told me that she did get the gel and is using it but on further questioning we found out that she was referring to triamcinolone cream which she pointed to on her medication list.  She denies any falls.  She ambulates independently.      She is requesting refills on all of her medications.  Patient Active Problem List   Diagnosis Date Noted  . Breast mass, right 11/23/2015  . 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 Ear noise/buzzing 10/30/2015  . Chronic low back pain 10/30/2015  . Type 2 diabetes mellitus with microalbuminuria ([place]) 09/29/2015  . Poorly fitting dentures 09/29/2015  . Hypothyroidism 09/29/2015  . Primary osteoarthritis of both knees 09/29/2015  . Spondylolisthesis 07/19/2015  . Overweight (BMI 25.0-29.9) 06/29/2015  . Memory deficit 11/28/2014  . Coronary atherosclerosis of native coronary artery 06/24/2013  . History of TIAs 06/24/2013  . GERD (gastroesophageal reflux disease) 06/24/2013  . Depression 12/31/2012  . THYROID NODULE 02/14/2009  . Elevated lipids 02/14/2009  . ANEMIA, CHRONIC  02/14/2009  . Essential hypertension 02/14/2009  . PULMONARY NODULE 02/14/2009     Current Outpatient Medications on File Prior to Visit  Medication Sig Dispense Refill  . acetaminophen (TYLENOL 8 HOUR) 650 MG CR tablet Take 1 tablet (650 mg total) by mouth every 8 (eight) hours as needed for pain. 90 tablet 5  . loratadine (CLARITIN) 10 MG tablet Take 1 tablet (10 mg total) by mouth daily as needed for allergies. 30 tablet 2  . nitroGLYCERIN (NITROSTAT) 0.4 MG SL tablet Place 1 tablet (0.4 mg total) under the tongue every 5 (five) minutes as needed for chest pain. 15 tablet 2  . triamcinolone cream (KENALOG) 0.1 % Apply 1 application topically 2 (two) times daily. 30 g 0   No current facility-administered medications on file prior to visit.     No Known Allergies  Social History   Socioeconomic History  . Marital status: Married    Spouse name: Not on file  . Number of children: Not on file  . Years of education: Not on file  . Highest education level: Not on file  Occupational History  . Not on file  Social Needs  . Financial resource strain: Not on file  . Food insecurity:    Worry: Not on file    Inability: Not on file  . Transportation needs:    Medical: Not on file    Non-medical: Not on file  Tobacco Use  . Smoking status: Never Smoker  . Smokeless tobacco: Never Used  Substance and Sexual Activity  . Alcohol  use: No    Alcohol/week: 0.0 standard drinks  . Drug use: No  . Sexual activity: Never  Lifestyle  . Physical activity:    Days per week: Not on file    Minutes per session: Not on file  . Stress: Not on file  Relationships  . Social connections:    Talks on phone: Not on file    Gets together: Not on file    Attends religious service: Not on file    Active member of club or organization: Not on file    Attends meetings of clubs or organizations: Not on file    Relationship status: Not on file  . Intimate partner violence:    Fear of current or ex  partner: Not on file    Emotionally abused: Not on file    Physically abused: Not on file    Forced sexual activity: Not on file  Other Topics Concern  . Not on file  Social History Narrative   Lives with daughter in a 2 story home.  Does not work.     Family History  Problem Relation Age of Onset  . Heart disease Mother   . Cancer Father        a tumor ruptured in his abd.    Past Surgical History:  Procedure Laterality Date  . LEFT HEART CATHETERIZATION WITH CORONARY ANGIOGRAM N/A 09/16/2011   Procedure: LEFT HEART CATHETERIZATION WITH CORONARY ANGIOGRAM;  Surgeon: [name], MD;  Location: [facility] CATH LAB;  Service: Cardiovascular;  Laterality: N/A;  . TUBAL LIGATION    . VAGINAL HYSTERECTOMY  1989    ROS: Review of Systems Negative except as above. PHYSICAL EXAM: BP (!) 175/76   Pulse 72   Temp 97.8 F (36.6 C) (Oral)   Resp 16   Wt 136 lb (61.7 kg)   SpO2 98%   BMI 27.47 kg/m   BP 170/80 Physical Exam General appearance - alert, well appearing, pleasant elderly female and in no distress Mental status - normal mood, behavior, speech, dress, motor activity.  Patient very talkative Nose: No enlargement of nasal tube maintenance. Mouth: No exudates of the pharynx.   Neck: No cervical lymphadenopathy Chest -fine crackles heard in the left lower field; other lung fields are clear.  Breathing does not appear labored. Heart - normal rate, regular rhythm, normal S1, S2, no murmurs, rubs, clicks or gallops Musculoskeletal -knees: Joints are enlarged.  Mild crepitus on passive movement.  Gait is steady. Extremities -no lower extremity edema  Results for orders placed or performed in visit on 06/18/18  POCT glucose (manual entry)  Result Value Ref Range   POC Glucose 121 (A) 70 - 99 mg/dl  POCT glycosylated hemoglobin (Hb A1C)  Result Value Ref Range   Hemoglobin A1C     HbA1c POC (<> result, manual entry)     HbA1c, POC (prediabetic range) 6.2 5.7 - 6.4 %    HbA1c, POC (controlled diabetic range)       Chemistry      Component Value Date/Time   NA 142 03/30/2018 1021   K 4.1 03/30/2018 1021   CL 104 03/30/2018 1021   CO2 24 03/30/2018 1021   BUN 25 03/30/2018 1021   CREATININE 1.35 (H) 03/30/2018 1021   CREATININE 0.87 05/21/2016 1236      Component Value Date/Time   CALCIUM 9.4 03/30/2018 1021   ALKPHOS 67 08/26/2017 1620   AST 13 08/26/2017 1620   ALT 10 08/26/2017 1620  BILITOT 0.2 08/26/2017 1620      ASSESSMENT AND PLAN: 1. Bronchitis vs early pneumonia - azithromycin (ZITHROMAX) 250 MG tablet; 2 tabs PO x 1 day then 1 tab PO daily  Dispense: 6 tablet; Refill: 0  2. Essential hypertension Not at goal.  She is needing refills on 2 of her medications which she has been out of for 2 days. - lisinopril (PRINIVIL,ZESTRIL) 20 MG tablet; Take 1 tablet (20 mg total) by mouth daily. Dose increase  Dispense: 30 tablet; Refill: 6 - hydrochlorothiazide (HYDRODIURIL) 25 MG tablet; TAKE 1 BY MOUTH EVERY DAY`  Dispense: 90 tablet; Refill: 3 - carvedilol (COREG) 25 MG tablet; Take 1 tablet (25 mg total) by mouth 2 (two) times daily with a meal.  Dispense: 60 tablet; Refill: 11  3. Type 2 diabetes mellitus with microalbuminuria, without long-term current use of insulin (HCC) Diet controlled. - POCT glucose (manual entry) - POCT glycosylated hemoglobin (Hb A1C) - Microalbumin / creatinine urine ratio - CBC with Differential/Platelet - Comprehensive metabolic panel  4. Gastroesophageal reflux disease, esophagitis presence not specified We will stop his Zantac.  Refill omeprazole - omeprazole (PRILOSEC) 20 MG capsule; Take 1 capsule (20 mg total) by mouth daily.  Dispense: 30 capsule; Refill: 6  5. Primary osteoarthritis of both knees - diclofenac sodium (VOLTAREN) 1 % GEL; Apply 2 g topically 4 (four) times daily.  Dispense: 100 g; Refill: 2  6. Memory deficit - memantine (NAMENDA) 10 MG tablet; Take 1 tablet (10 mg total) by mouth  daily.  Dispense: 90 tablet; Refill: 3  7. Other specified hypothyroidism Refill levothyroxine - levothyroxine (SYNTHROID) 25 MCG tablet; TAKE 1 TABLET BY MOUTH ONCE DAILY  Dispense: 30 tablet; Refill: 11  8. Hyperlipidemia, unspecified hyperlipidemia type - Lipid panel - atorvastatin (LIPITOR) 40 MG tablet; Take 1 tablet (40 mg total) by mouth daily.  Dispense: 30 tablet; Refill: 11  9. Need for immunization against influenza - Flu Vaccine QUAD 36+ mos IM  10. Need for vaccination against Streptococcus pneumoniae using pneumococcal conjugate vaccine 13  Addendum: Patient with renal insufficiency noted on chemistry that was done on last visit.  At that time she was taking an NSAID that she had gotten from her country.  I had subsequently told her to stop taking it and to use the Voltaren gel instead.  She confirms that she is no longer taking it.  She also confirms that she is not using any over-the-counter pain medication  Patient was given the opportunity to ask questions.  Patient verbalized understanding of the plan and was able to repeat key elements of the plan.  [employer] interpreter used during this encounter.  Orders Placed This Encounter  Procedures  . Tdap vaccine greater than or equal to 7yo IM  . Pneumococcal conjugate vaccine 13-valent  . Flu Vaccine QUAD 36+ mos IM  . Microalbumin / creatinine urine ratio  . Lipid panel  . CBC with Differential/Platelet  . Comprehensive metabolic panel  . POCT glucose (manual entry)  . POCT glycosylated hemoglobin (Hb A1C)     Requested Prescriptions   Signed Prescriptions Disp Refills  . lisinopril (PRINIVIL,ZESTRIL) 20 MG tablet 30 tablet 6    Sig: Take 1 tablet (20 mg total) by mouth daily. Dose increase  . omeprazole (PRILOSEC) 20 MG capsule 30 capsule 6    Sig: Take 1 capsule (20 mg total) by mouth daily.  . diclofenac sodium (VOLTAREN) 1 % GEL 100 g 2    Sig: Apply 2 g topically  4 (four) times daily.  . memantine  (NAMENDA) 10 MG tablet 90 tablet 3    Sig: Take 1 tablet (10 mg total) by mouth daily.  Marland Kitchen levothyroxine (SYNTHROID) 25 MCG tablet 30 tablet 11    Sig: TAKE 1 TABLET BY MOUTH ONCE DAILY  . hydrochlorothiazide (HYDRODIURIL) 25 MG tablet 90 tablet 3    Sig: TAKE 1 BY MOUTH EVERY DAY`  . atorvastatin (LIPITOR) 40 MG tablet 30 tablet 11    Sig: Take 1 tablet (40 mg total) by mouth daily.  . carvedilol (COREG) 25 MG tablet 60 tablet 11    Sig: Take 1 tablet (25 mg total) by mouth 2 (two) times daily with a meal.  . clopidogrel (PLAVIX) 75 MG tablet 90 tablet 3    Sig: Take 1 tablet (75 mg total) by mouth daily.  Marland Kitchen azithromycin (ZITHROMAX) 250 MG tablet 6 tablet 0    Sig: 2 tabs PO x 1 day then 1 tab PO daily    Return in about 3 months (around 09/18/2018).  Karle Plumber, MD, FACP

## 2018-06-19 ENCOUNTER — Other Ambulatory Visit: Payer: Self-pay | Admitting: Internal Medicine

## 2018-06-19 DIAGNOSIS — I10 Essential (primary) hypertension: Secondary | ICD-10-CM

## 2018-06-19 DIAGNOSIS — E785 Hyperlipidemia, unspecified: Secondary | ICD-10-CM

## 2018-06-19 LAB — CBC WITH DIFFERENTIAL/PLATELET
Basophils Absolute: 0 10*3/uL (ref 0.0–0.2)
Basos: 1 %
EOS (ABSOLUTE): 0.3 10*3/uL (ref 0.0–0.4)
Eos: 5 %
Hematocrit: 37.6 % (ref 34.0–46.6)
Hemoglobin: 12.2 g/dL (ref 11.1–15.9)
IMMATURE GRANULOCYTES: 0 %
Immature Grans (Abs): 0 10*3/uL (ref 0.0–0.1)
Lymphocytes Absolute: 1.9 10*3/uL (ref 0.7–3.1)
Lymphs: 36 %
MCH: 29.3 pg (ref 26.6–33.0)
MCHC: 32.4 g/dL (ref 31.5–35.7)
MCV: 90 fL (ref 79–97)
MONOS ABS: 0.5 10*3/uL (ref 0.1–0.9)
Monocytes: 10 %
NEUTROS PCT: 48 %
Neutrophils Absolute: 2.6 10*3/uL (ref 1.4–7.0)
Platelets: 238 10*3/uL (ref 150–450)
RBC: 4.16 x10E6/uL (ref 3.77–5.28)
RDW: 12 % — AB (ref 12.3–15.4)
WBC: 5.4 10*3/uL (ref 3.4–10.8)

## 2018-06-19 LAB — COMPREHENSIVE METABOLIC PANEL
A/G RATIO: 1.4 (ref 1.2–2.2)
ALK PHOS: 92 IU/L (ref 39–117)
ALT: 10 IU/L (ref 0–32)
AST: 14 IU/L (ref 0–40)
Albumin: 4.2 g/dL (ref 3.5–4.8)
BUN/Creatinine Ratio: 26 (ref 12–28)
BUN: 31 mg/dL — ABNORMAL HIGH (ref 8–27)
Bilirubin Total: 0.2 mg/dL (ref 0.0–1.2)
CALCIUM: 9.3 mg/dL (ref 8.7–10.3)
CO2: 23 mmol/L (ref 20–29)
CREATININE: 1.2 mg/dL — AB (ref 0.57–1.00)
Chloride: 102 mmol/L (ref 96–106)
GFR calc Af Amer: 51 mL/min/{1.73_m2} — ABNORMAL LOW (ref >59)
GFR, EST NON AFRICAN AMERICAN: 44 mL/min/{1.73_m2} — AB (ref >59)
Globulin, Total: 3 g/dL (ref 1.5–4.5)
Glucose: 93 mg/dL (ref 65–99)
POTASSIUM: 4 mmol/L (ref 3.5–5.2)
Sodium: 140 mmol/L (ref 134–144)
Total Protein: 7.2 g/dL (ref 6.0–8.5)

## 2018-06-19 LAB — LIPID PANEL
Chol/HDL Ratio: 4 ratio (ref 0.0–4.4)
Cholesterol, Total: 145 mg/dL (ref 100–199)
HDL: 36 mg/dL — ABNORMAL LOW (ref >39)
LDL Calculated: 69 mg/dL (ref 0–99)
Triglycerides: 201 mg/dL — ABNORMAL HIGH (ref 0–149)
VLDL Cholesterol Cal: 40 mg/dL (ref 5–40)

## 2018-06-19 LAB — MICROALBUMIN / CREATININE URINE RATIO
Creatinine, Urine: 72.9 mg/dL
MICROALB/CREAT RATIO: 28.4 mg/g{creat} (ref 0.0–30.0)
MICROALBUM., U, RANDOM: 20.7 ug/mL

## 2018-06-23 ENCOUNTER — Telehealth: Payer: Self-pay

## 2018-06-23 NOTE — Telephone Encounter (Signed)
[place]  Id# [phone]  contacted pt to go over lab results pt is aware and doesn't have any questions or concerns

## 2018-07-06 ENCOUNTER — Telehealth: Payer: Self-pay | Admitting: Internal Medicine

## 2018-07-06 ENCOUNTER — Ambulatory Visit: Payer: Medicare Other | Admitting: Internal Medicine

## 2018-07-06 NOTE — Telephone Encounter (Signed)
Pharmacy confirmed patient picked up all meds that were due for refill on 06/18/18 when they were sent in. If there is something in particular she needs she should contact the pharmacy first to refill it and if there are any issues she can call us and I will do my best to take care of it.

## 2018-07-06 NOTE — Telephone Encounter (Signed)
Patient says she just contacted the pharmacy while waiting in the lobby and was told she does not have any refills for the atorvastatin and carvedilol. Please follow up with patient. She would like to have a paper prescription of those medications, that way she can go to any other pharmacy. Please follow up.

## 2018-07-06 NOTE — Telephone Encounter (Signed)
Patient would like a paper prescription for her prescriptions. Patient was told Pharmacy tech will follow up with pharmacy and to check in later however, patient wants the paper prescription so that she can also compare prices VS. Getting it sent over electronically.

## 2018-07-06 NOTE — Telephone Encounter (Signed)
Called the pharmacy to help patient find out what is going on with the medication refills due to the language barrier. Pharmacy tech explained that prescriptions were picked up October 10th and should still have enough quantity for the month. Pharmacy tech says the medication will not be available for pickup until November 10th.

## 2018-07-06 NOTE — Telephone Encounter (Signed)
Patient came in wanting to get her prescriptions resent to the pharmacy [facility] on [address]. She went to the pharmacy and was told the prescriptions were not there. Please follow up.

## 2018-08-21 ENCOUNTER — Other Ambulatory Visit: Payer: Self-pay | Admitting: Internal Medicine

## 2018-08-21 DIAGNOSIS — R413 Other amnesia: Secondary | ICD-10-CM

## 2018-08-21 DIAGNOSIS — I10 Essential (primary) hypertension: Secondary | ICD-10-CM

## 2018-08-21 DIAGNOSIS — E785 Hyperlipidemia, unspecified: Secondary | ICD-10-CM

## 2018-09-24 ENCOUNTER — Other Ambulatory Visit: Payer: Self-pay | Admitting: Internal Medicine

## 2018-09-24 DIAGNOSIS — I10 Essential (primary) hypertension: Secondary | ICD-10-CM

## 2018-09-24 DIAGNOSIS — E785 Hyperlipidemia, unspecified: Secondary | ICD-10-CM

## 2018-10-13 ENCOUNTER — Other Ambulatory Visit: Payer: Self-pay | Admitting: Internal Medicine

## 2018-10-16 ENCOUNTER — Other Ambulatory Visit: Payer: Self-pay

## 2018-10-16 ENCOUNTER — Telehealth: Payer: Self-pay | Admitting: Internal Medicine

## 2018-10-16 DIAGNOSIS — I10 Essential (primary) hypertension: Secondary | ICD-10-CM

## 2018-10-16 MED ORDER — LISINOPRIL 20 MG PO TABS: 20.0000 mg | ORAL_TABLET | Freq: Every day | ORAL | 0 refills | Status: DC

## 2018-10-16 MED ORDER — CARVEDILOL 25 MG PO TABS: ORAL_TABLET | ORAL | 0 refills | Status: DC

## 2018-10-16 MED ORDER — HYDROCHLOROTHIAZIDE 25 MG PO TABS: ORAL_TABLET | ORAL | 0 refills | Status: DC

## 2018-10-16 NOTE — Telephone Encounter (Signed)
1) Medication(s) Requested (by name): LISINOPRIL, PLAVIX, COREG, HYDROCHLOROTHIAZIDE  2) Pharmacy of Choice: [facility]  3) Special Requests: [facility] [phone]   Approved medications will be sent to the pharmacy, we will reach out if there is an issue.  Requests made after 3pm may not be addressed until the following business day!  If a patient is unsure of the name of the medication(s) please note and ask patient to call back when they are able to provide all info, do not send to responsible party until all information is available!

## 2018-10-16 NOTE — Telephone Encounter (Signed)
Sent a 30 day supply for Lisinopril, Coreg and Hctz   Pt will need to schedule an appointment before medication run out for a refill

## 2018-10-22 ENCOUNTER — Telehealth: Payer: Self-pay | Admitting: Internal Medicine

## 2018-10-22 NOTE — Telephone Encounter (Signed)
1) Medication(s) Requested (by name): plavix Hydrochlorothiazide Atorvastatin Lisinopril  2) Pharmacy of Choice:  [facility], [place] - [address] AT [address] they do not have the prescriptions.

## 2018-10-23 NOTE — Telephone Encounter (Signed)
Refills were sent to [facility].

## 2018-11-09 ENCOUNTER — Ambulatory Visit: Payer: Medicare Other | Attending: Internal Medicine | Admitting: Internal Medicine

## 2018-11-09 ENCOUNTER — Other Ambulatory Visit: Payer: Self-pay | Admitting: Internal Medicine

## 2018-11-09 ENCOUNTER — Encounter: Payer: Self-pay | Admitting: Internal Medicine

## 2018-11-09 VITALS — BP 165/104 | HR 74 | Temp 97.9°F | Resp 16 | Wt 146.0 lb

## 2018-11-09 DIAGNOSIS — I10 Essential (primary) hypertension: Secondary | ICD-10-CM | POA: Diagnosis not present

## 2018-11-09 DIAGNOSIS — E785 Hyperlipidemia, unspecified: Secondary | ICD-10-CM | POA: Diagnosis not present

## 2018-11-09 DIAGNOSIS — Z791 Long term (current) use of non-steroidal anti-inflammatories (NSAID): Secondary | ICD-10-CM | POA: Insufficient documentation

## 2018-11-09 DIAGNOSIS — E1165 Type 2 diabetes mellitus with hyperglycemia: Secondary | ICD-10-CM | POA: Diagnosis not present

## 2018-11-09 DIAGNOSIS — N183 Chronic kidney disease, stage 3 unspecified: Secondary | ICD-10-CM

## 2018-11-09 DIAGNOSIS — I2510 Atherosclerotic heart disease of native coronary artery without angina pectoris: Secondary | ICD-10-CM | POA: Insufficient documentation

## 2018-11-09 DIAGNOSIS — E1122 Type 2 diabetes mellitus with diabetic chronic kidney disease: Secondary | ICD-10-CM | POA: Insufficient documentation

## 2018-11-09 DIAGNOSIS — K219 Gastro-esophageal reflux disease without esophagitis: Secondary | ICD-10-CM | POA: Insufficient documentation

## 2018-11-09 DIAGNOSIS — J302 Other seasonal allergic rhinitis: Secondary | ICD-10-CM | POA: Diagnosis not present

## 2018-11-09 DIAGNOSIS — Z79899 Other long term (current) drug therapy: Secondary | ICD-10-CM | POA: Insufficient documentation

## 2018-11-09 DIAGNOSIS — E041 Nontoxic single thyroid nodule: Secondary | ICD-10-CM | POA: Insufficient documentation

## 2018-11-09 DIAGNOSIS — Z8249 Family history of ischemic heart disease and other diseases of the circulatory system: Secondary | ICD-10-CM | POA: Insufficient documentation

## 2018-11-09 DIAGNOSIS — D649 Anemia, unspecified: Secondary | ICD-10-CM | POA: Diagnosis not present

## 2018-11-09 DIAGNOSIS — I129 Hypertensive chronic kidney disease with stage 1 through stage 4 chronic kidney disease, or unspecified chronic kidney disease: Secondary | ICD-10-CM | POA: Diagnosis not present

## 2018-11-09 DIAGNOSIS — E038 Other specified hypothyroidism: Secondary | ICD-10-CM | POA: Insufficient documentation

## 2018-11-09 DIAGNOSIS — M170 Bilateral primary osteoarthritis of knee: Secondary | ICD-10-CM | POA: Insufficient documentation

## 2018-11-09 DIAGNOSIS — R252 Cramp and spasm: Secondary | ICD-10-CM | POA: Insufficient documentation

## 2018-11-09 DIAGNOSIS — Z79890 Hormone replacement therapy (postmenopausal): Secondary | ICD-10-CM | POA: Insufficient documentation

## 2018-11-09 DIAGNOSIS — Z8673 Personal history of transient ischemic attack (TIA), and cerebral infarction without residual deficits: Secondary | ICD-10-CM | POA: Diagnosis not present

## 2018-11-09 DIAGNOSIS — E118 Type 2 diabetes mellitus with unspecified complications: Secondary | ICD-10-CM

## 2018-11-09 LAB — POCT GLYCOSYLATED HEMOGLOBIN (HGB A1C): HbA1c, POC (prediabetic range): 6.2 % (ref 5.7–6.4)

## 2018-11-09 LAB — GLUCOSE, POCT (MANUAL RESULT ENTRY): POC Glucose: 158 mg/dl — AB (ref 70–99)

## 2018-11-09 MED ORDER — ATORVASTATIN CALCIUM 20 MG PO TABS: 20.0000 mg | ORAL_TABLET | Freq: Every day | ORAL | 6 refills | Status: DC

## 2018-11-09 MED ORDER — OMEPRAZOLE 20 MG PO CPDR: 20.0000 mg | DELAYED_RELEASE_CAPSULE | Freq: Every day | ORAL | 6 refills | Status: DC

## 2018-11-09 MED ORDER — LORATADINE 10 MG PO TABS: 10.0000 mg | ORAL_TABLET | Freq: Every day | ORAL | 2 refills | Status: DC | PRN

## 2018-11-09 MED ORDER — HYDROCHLOROTHIAZIDE 25 MG PO TABS: ORAL_TABLET | ORAL | 6 refills | Status: DC

## 2018-11-09 MED ORDER — FLUTICASONE PROPIONATE 50 MCG/ACT NA SUSP: 1.0000 | Freq: Every day | NASAL | 1 refills | Status: DC | PRN

## 2018-11-09 NOTE — Progress Notes (Signed)
Patient ID: Wendy Petty, female    DOB: [birth year]  MRN: [id number]  CC: Diabetes and Hypertension   Subjective: Wendy Petty is a 78 y.o. female who presents for chronic ds management.  Daughter, Derry Skill, is with her and both she and pt wants her to interpret. Her concerns today include:  Hx ofHTN, DM, hypothyroid, GERD, coronary atherosclerosis, HL,lacunar CVA,OA back, hip and knees  C/o allergy symptoms.  Symptoms include runny nose, itchy eyes.  Needs RF on Claritin  DM: does not check BS.  Not on meds for this.  Diet controlled.  Reports that she is doing well with her eating habits.  OA knees: Reports chronic pain in both knees.  The pain does not prevent her from going about her activities of daily living.  Uses Voltaren Gel every night and sometimes BID.  Walks when weather is good.  HTN/CAD:  Did not take meds as yet for today.  Endorses salt restriction.   Reports compliance with meds No chest pains or shortness of breath.  No swelling in the legs. C/o leg cramps at nights  Request to have a ears checked as she sometimes noticed decreased hearing in both ears. Patient Active Problem List   Diagnosis Date Noted  . Breast mass, right 11/23/2015  . 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 Ear noise/buzzing 10/30/2015  . Chronic low back pain 10/30/2015  . Type 2 diabetes mellitus with microalbuminuria ([place]) 09/29/2015  . Poorly fitting dentures 09/29/2015  . Hypothyroidism 09/29/2015  . Primary osteoarthritis of both knees 09/29/2015  . Spondylolisthesis 07/19/2015  . Overweight (BMI 25.0-29.9) 06/29/2015  . Memory deficit 11/28/2014  . Coronary atherosclerosis of native coronary artery 06/24/2013  . History of TIAs 06/24/2013  . GERD (gastroesophageal reflux disease) 06/24/2013  . Depression 12/31/2012  . THYROID NODULE 02/14/2009  . Elevated lipids 02/14/2009  . ANEMIA, CHRONIC 02/14/2009  . Essential hypertension 02/14/2009  .  PULMONARY NODULE 02/14/2009     Current Outpatient Medications on File Prior to Visit  Medication Sig Dispense Refill  . acetaminophen (TYLENOL 8 HOUR) 650 MG CR tablet Take 1 tablet (650 mg total) by mouth every 8 (eight) hours as needed for pain. (Patient not taking: Reported on 11/09/2018) 90 tablet 5  . atorvastatin (LIPITOR) 40 MG tablet TAKE 1 TABLET BY MOUTH DAILY 30 tablet 1  . azithromycin (ZITHROMAX) 250 MG tablet 2 tabs PO x 1 day then 1 tab PO daily (Patient not taking: Reported on 11/09/2018) 6 tablet 0  . carvedilol (COREG) 25 MG tablet TAKE 1 TABLET BY MOUTH TWICE DAILY WITH A MEAL 60 tablet 0  . clopidogrel (PLAVIX) 75 MG tablet TAKE 1 TABLET BY MOUTH DAILY 30 tablet 2  . diclofenac sodium (VOLTAREN) 1 % GEL Apply 2 g topically 4 (four) times daily. 100 g 2  . hydrochlorothiazide (HYDRODIURIL) 25 MG tablet TAKE 1 BY MOUTH EVERY DAY` 30 tablet 0  . levothyroxine (SYNTHROID) 25 MCG tablet TAKE 1 TABLET BY MOUTH ONCE DAILY 30 tablet 11  . lisinopril (PRINIVIL,ZESTRIL) 20 MG tablet Take 1 tablet (20 mg total) by mouth daily. Dose increase 30 tablet 0  . loratadine (CLARITIN) 10 MG tablet Take 1 tablet (10 mg total) by mouth daily as needed for allergies. 30 tablet 2  . memantine (NAMENDA) 10 MG tablet TAKE 1 TABLET BY MOUTH DAILY 30 tablet 2  . nitroGLYCERIN (NITROSTAT) 0.4 MG SL tablet Place 1 tablet (0.4 mg total) under the tongue every 5 (  five) minutes as needed for chest pain. 15 tablet 2  . omeprazole (PRILOSEC) 20 MG capsule Take 1 capsule (20 mg total) by mouth daily. 30 capsule 6  . triamcinolone cream (KENALOG) 0.1 % Apply 1 application topically 2 (two) times daily. (Patient not taking: Reported on 11/09/2018) 30 g 0   No current facility-administered medications on file prior to visit.     No Known Allergies  Social History   Socioeconomic History  . Marital status: Married    Spouse name: Not on file  . Number of children: Not on file  . Years of education: Not on  file  . Highest education level: Not on file  Occupational History  . Not on file  Social Needs  . Financial resource strain: Not on file  . Food insecurity:    Worry: Not on file    Inability: Not on file  . Transportation needs:    Medical: Not on file    Non-medical: Not on file  Tobacco Use  . Smoking status: Never Smoker  . Smokeless tobacco: Never Used  Substance and Sexual Activity  . Alcohol use: No    Alcohol/week: 0.0 standard drinks  . Drug use: No  . Sexual activity: Never  Lifestyle  . Physical activity:    Days per week: Not on file    Minutes per session: Not on file  . Stress: Not on file  Relationships  . Social connections:    Talks on phone: Not on file    Gets together: Not on file    Attends religious service: Not on file    Active member of club or organization: Not on file    Attends meetings of clubs or organizations: Not on file    Relationship status: Not on file  . Intimate partner violence:    Fear of current or ex partner: Not on file    Emotionally abused: Not on file    Physically abused: Not on file    Forced sexual activity: Not on file  Other Topics Concern  . Not on file  Social History Narrative   Lives with daughter in a 2 story home.  Does not work.     Family History  Problem Relation Age of Onset  . Heart disease Mother   . Cancer Father        a tumor ruptured in his abd.    Past Surgical History:  Procedure Laterality Date  . LEFT HEART CATHETERIZATION WITH CORONARY ANGIOGRAM N/A 09/16/2011   Procedure: LEFT HEART CATHETERIZATION WITH CORONARY ANGIOGRAM;  Surgeon: [name], MD;  Location: [facility] CATH LAB;  Service: Cardiovascular;  Laterality: N/A;  . TUBAL LIGATION    . VAGINAL HYSTERECTOMY  1989    ROS: Review of Systems Negative except as stated above  PHYSICAL EXAM: BP (!) 165/104   Pulse 74   Temp 97.9 F (36.6 C) (Oral)   Resp 16   Wt 146 lb (66.2 kg)   SpO2 99%   BMI 29.49 kg/m   Physical  Exam  General appearance - alert, well appearing, and in no distress Mental status - normal mood, behavior, speech, dress, motor activity, and thought processes Ears - bilateral TM's and external ear canals normal Nose: Mild enlargement of nasal turbinates Mouth - mucous membranes moist, pharynx normal without lesions Neck - supple, no significant adenopathy Chest - clear to auscultation, no wheezes, rales or rhonchi, symmetric air entry Heart - normal rate, regular rhythm, normal S1, S2,  no murmurs, rubs, clicks or gallops Extremities - peripheral pulses normal, no pedal edema, no clubbing or cyanosis MSK: Knee joints are enlarged.  No edema or erythema.  She has fairly good range of motion CMP Latest Ref Rng & Units 06/18/2018 03/30/2018 09/24/2017  Glucose 65 - 99 mg/dL 93 109(H) 119(H)  BUN 8 - 27 mg/dL 31(H) 25 28(H)  Creatinine 0.57 - 1.00 mg/dL 1.20(H) 1.35(H) 1.00  Sodium 134 - 144 mmol/L 140 142 142  Potassium 3.5 - 5.2 mmol/L 4.0 4.1 5.5(H)  Chloride 96 - 106 mmol/L 102 104 103  CO2 20 - 29 mmol/L 23 24 27   Calcium 8.7 - 10.3 mg/dL 9.3 9.4 9.6  Total Protein 6.0 - 8.5 g/dL 7.2 - -  Total Bilirubin 0.0 - 1.2 mg/dL 0.2 - -  Alkaline Phos 39 - 117 IU/L 92 - -  AST 0 - 40 IU/L 14 - -  ALT 0 - 32 IU/L 10 - -   Lipid Panel     Component Value Date/Time   CHOL 145 06/18/2018 1210   TRIG 201 (H) 06/18/2018 1210   HDL 36 (L) 06/18/2018 1210   CHOLHDL 4.0 06/18/2018 1210   CHOLHDL 3.7 05/21/2016 1236   VLDL 43 (H) 05/21/2016 1236   LDLCALC 69 06/18/2018 1210    CBC    Component Value Date/Time   WBC 5.4 06/18/2018 1210   WBC 10.1 09/13/2017 0108   RBC 4.16 06/18/2018 1210   RBC 4.12 09/13/2017 0108   HGB 12.2 06/18/2018 1210   HCT 37.6 06/18/2018 1210   PLT 238 06/18/2018 1210   MCV 90 06/18/2018 1210   MCH 29.3 06/18/2018 1210   MCH 30.1 09/13/2017 0108   MCHC 32.4 06/18/2018 1210   MCHC 32.9 09/13/2017 0108   RDW 12.0 (L) 06/18/2018 1210   LYMPHSABS 1.9  06/18/2018 1210   MONOABS 0.5 06/28/2013 1820   EOSABS 0.3 06/18/2018 1210   BASOSABS 0.0 06/18/2018 1210    ASSESSMENT AND PLAN: 1. Type 2 diabetes mellitus control, without long-term current use of insulin (HCC) Continue healthy eating habits.  Remain as active as she possibly could - POCT glucose (manual entry) - POCT glycosylated hemoglobin (Hb A1C)  2. Seasonal allergies - loratadine (CLARITIN) 10 MG tablet; Take 1 tablet (10 mg total) by mouth daily as needed for allergies.  Dispense: 30 tablet; Refill: 2 - fluticasone (FLONASE) 50 MCG/ACT nasal spray; Place 1 spray into both nostrils daily as needed for allergies or rhinitis.  Dispense: 16 g; Refill: 1  3. Hyperlipidemia, unspecified hyperlipidemia type Leg cramps at night is likely due to Lipitor.  We discussed cutting the dose in half from 40 mg to 20 mg daily. - atorvastatin (LIPITOR) 20 MG tablet; Take 1 tablet (20 mg total) by mouth daily.  Dispense: 30 tablet; Refill: 6  4. Gastroesophageal reflux disease, esophagitis presence not specified Patient requested refill on omeprazole - omeprazole (PRILOSEC) 20 MG capsule; Take 1 capsule (20 mg total) by mouth daily.  Dispense: 30 capsule; Refill: 6  5. Essential hypertension Not at goal.  However patient has not taken medicines as yet for today.  Encouraged her to take medicines every day as prescribed - hydrochlorothiazide (HYDRODIURIL) 25 MG tablet; TAKE 1 BY MOUTH EVERY DAY`  Dispense: 30 tablet; Refill: 6  6. CKD (chronic kidney disease) stage 3, GFR 30-59 ml/min (HCC) Avoid oral NSAIDs - Basic Metabolic Panel  7. Primary osteoarthritis of both knees Continue Voltaren gel.  Patient told she can use it  up to 3 times a day.  8. Other specified hypothyroidism - TSH  9. Nocturnal muscle cramp Likely due to Lipitor.  See plan above.     Patient was given the opportunity to ask questions.  Patient verbalized understanding of the plan and was able to repeat key  elements of the plan.   Orders Placed This Encounter  Procedures  . POCT glucose (manual entry)  . POCT glycosylated hemoglobin (Hb A1C)     Requested Prescriptions    No prescriptions requested or ordered in this encounter    No follow-ups on file.  Karle Plumber, MD, FACP

## 2018-11-09 NOTE — Patient Instructions (Addendum)
The atorvastatin may be causing the cramps that you get in your legs at night.  I recommend decreasing the dose of atorvastatin to 20 mg daily.  Your blood pressure is elevated.  Be sure to take your blood pressure medications as soon as you return home.  You can use the Voltaren gel on your knees 2-3 times a day as needed.

## 2018-11-10 ENCOUNTER — Other Ambulatory Visit: Payer: Self-pay | Admitting: Internal Medicine

## 2018-11-10 DIAGNOSIS — I10 Essential (primary) hypertension: Secondary | ICD-10-CM

## 2018-11-10 LAB — BASIC METABOLIC PANEL
BUN/Creatinine Ratio: 26 (ref 12–28)
BUN: 29 mg/dL — ABNORMAL HIGH (ref 8–27)
CO2: 24 mmol/L (ref 20–29)
Calcium: 9.1 mg/dL (ref 8.7–10.3)
Chloride: 104 mmol/L (ref 96–106)
Creatinine, Ser: 1.11 mg/dL — ABNORMAL HIGH (ref 0.57–1.00)
GFR calc Af Amer: 56 mL/min/{1.73_m2} — ABNORMAL LOW (ref >59)
GFR, EST NON AFRICAN AMERICAN: 48 mL/min/{1.73_m2} — AB (ref >59)
Glucose: 93 mg/dL (ref 65–99)
Potassium: 4.2 mmol/L (ref 3.5–5.2)
Sodium: 141 mmol/L (ref 134–144)

## 2018-11-10 LAB — TSH: TSH: 1.59 u[IU]/mL (ref 0.450–4.500)

## 2018-11-24 ENCOUNTER — Telehealth: Payer: Self-pay

## 2018-11-24 NOTE — Telephone Encounter (Signed)
[employer] interpreters Daniella  Id#  [id number] contacted pt to go over lab results pt didn't answer was unable to lvm due to vm being full

## 2018-12-11 ENCOUNTER — Other Ambulatory Visit: Payer: Self-pay | Admitting: Internal Medicine

## 2018-12-11 DIAGNOSIS — I10 Essential (primary) hypertension: Secondary | ICD-10-CM

## 2018-12-17 ENCOUNTER — Telehealth: Payer: Self-pay | Admitting: Internal Medicine

## 2018-12-17 ENCOUNTER — Other Ambulatory Visit: Payer: Self-pay | Admitting: Internal Medicine

## 2018-12-17 DIAGNOSIS — I10 Essential (primary) hypertension: Secondary | ICD-10-CM

## 2018-12-17 NOTE — Telephone Encounter (Signed)
Pt would like a 90 day supply for medication and  hydrochlorothiazide (HYDRODIURIL) 25 MG   both sent to  [facility] [address] AT [place]

## 2018-12-17 NOTE — Telephone Encounter (Signed)
1) Medication(s) Requested (by name): lisinopril (PRINIVIL,ZESTRIL) 20 MG tablet   2) Pharmacy of Choice: [facility], [place] - [address] AT [place]  3) Special Requests:   Approved medications will be sent to the pharmacy, we will reach out if there is an issue.  Requests made after 3pm may not be addressed until the following business day!  If a patient is unsure of the name of the medication(s) please note and ask patient to call back when they are able to provide all info, do not send to responsible party until all information is available!

## 2018-12-18 ENCOUNTER — Other Ambulatory Visit: Payer: Self-pay | Admitting: Pharmacist

## 2018-12-18 DIAGNOSIS — I10 Essential (primary) hypertension: Secondary | ICD-10-CM

## 2018-12-18 MED ORDER — LISINOPRIL 20 MG PO TABS: 20.0000 mg | ORAL_TABLET | Freq: Every day | ORAL | 0 refills | Status: DC

## 2018-12-18 MED ORDER — HYDROCHLOROTHIAZIDE 25 MG PO TABS: ORAL_TABLET | ORAL | 0 refills | Status: DC

## 2018-12-24 ENCOUNTER — Other Ambulatory Visit: Payer: Self-pay | Admitting: Internal Medicine

## 2018-12-24 DIAGNOSIS — I10 Essential (primary) hypertension: Secondary | ICD-10-CM

## 2019-01-13 ENCOUNTER — Other Ambulatory Visit: Payer: Self-pay | Admitting: Internal Medicine

## 2019-01-13 DIAGNOSIS — R413 Other amnesia: Secondary | ICD-10-CM

## 2019-02-04 ENCOUNTER — Other Ambulatory Visit: Payer: Self-pay

## 2019-02-04 ENCOUNTER — Ambulatory Visit: Payer: Medicare Other | Attending: Internal Medicine | Admitting: Internal Medicine

## 2019-02-04 DIAGNOSIS — N183 Chronic kidney disease, stage 3 unspecified: Secondary | ICD-10-CM

## 2019-02-04 DIAGNOSIS — L309 Dermatitis, unspecified: Secondary | ICD-10-CM | POA: Diagnosis not present

## 2019-02-04 DIAGNOSIS — I10 Essential (primary) hypertension: Secondary | ICD-10-CM | POA: Diagnosis not present

## 2019-02-04 DIAGNOSIS — M25552 Pain in left hip: Secondary | ICD-10-CM | POA: Diagnosis not present

## 2019-02-04 MED ORDER — CHLORTHALIDONE 25 MG PO TABS: 12.5000 mg | ORAL_TABLET | Freq: Every day | ORAL | 2 refills | Status: DC

## 2019-02-04 MED ORDER — TRIAMCINOLONE ACETONIDE 0.1 % EX CREA: TOPICAL_CREAM | CUTANEOUS | 0 refills | Status: DC

## 2019-02-04 NOTE — Progress Notes (Signed)
Virtual Visit via Telephone Note Due to current restrictions/limitations of in-office visits due to the COVID-19 pandemic, this scheduled clinical appointment was converted to a telehealth visit  I connected with Wendy Petty on 02/04/19 at 4:31 p.m EDT by telephone and verified that I am speaking with the correct person using two identifiers. I am in my office.  The patient is at home.  The patient, myself and interpreter Meda Coffee from [employer] [phone]) participated in this encounter.  I discussed the limitations, risks, security and privacy concerns of performing an evaluation and management service by telephone and the availability of in person appointments. I also discussed with the patient that there may be a patient responsible charge related to this service. The patient expressed understanding and agreed to proceed.   History of Present Illness: Hx ofHTN, DM, CKD 3, hypothyroid, GERD, coronary atherosclerosis, HL,lacunar CVA,OA back, hip and knees.  Last seen 11/09/2018   Pt c/o pain in LT hip intermittently.  It is chronic.  She has know arthritis  Of knees and lower back.  most of the time pain is 8/10 No recent falls Voltarin gel does not help.  Not taking Acetaminophen.  Thinks it may harm the kidney  HTN: She thinks HTCZ causes numbness in soles of feet.  She wants the medication change to something different.  Reports compliance with all the blood pressure medications and salt restriction. No device at home to check BP  Thinks she has fungus around calf LT calf x 1 mth.  + itches.  No rash there but states that when she scratches the area and applies alcohol or lemon as a home remedy, the area turns black.    She inquired about lab results from her visit in March.  Her thyroid level was normal.  Kidney function still not 100% but stable.  Outpatient Encounter Medications as of 02/04/2019  Medication Sig  . acetaminophen (TYLENOL 8 HOUR) 650 MG CR tablet Take  1 tablet (650 mg total) by mouth every 8 (eight) hours as needed for pain. (Patient not taking: Reported on 11/09/2018)  . atorvastatin (LIPITOR) 20 MG tablet Take 1 tablet (20 mg total) by mouth daily.  . carvedilol (COREG) 25 MG tablet TAKE 1 TABLET BY MOUTH TWICE DAILY WITH FOOD  . clopidogrel (PLAVIX) 75 MG tablet TAKE 1 TABLET BY MOUTH DAILY  . diclofenac sodium (VOLTAREN) 1 % GEL Apply 2 g topically 4 (four) times daily.  . fluticasone (FLONASE) 50 MCG/ACT nasal spray Place 1 spray into both nostrils daily as needed for allergies or rhinitis.  . hydrochlorothiazide (HYDRODIURIL) 25 MG tablet Take 1 tablet by mouth daily.  Marland Kitchen levothyroxine (SYNTHROID) 25 MCG tablet TAKE 1 TABLET BY MOUTH ONCE DAILY  . lisinopril (ZESTRIL) 20 MG tablet TAKE 1 TABLET (20 MG TOTAL) BY MOUTH DAILY. DOSE INCREASE  . loratadine (CLARITIN) 10 MG tablet Take 1 tablet (10 mg total) by mouth daily as needed for allergies.  . memantine (NAMENDA) 10 MG tablet TAKE 1 TABLET BY MOUTH DAILY  . nitroGLYCERIN (NITROSTAT) 0.4 MG SL tablet Place 1 tablet (0.4 mg total) under the tongue every 5 (five) minutes as needed for chest pain.  Marland Kitchen omeprazole (PRILOSEC) 20 MG capsule Take 1 capsule (20 mg total) by mouth daily.  Marland Kitchen triamcinolone cream (KENALOG) 0.1 % Apply 1 application topically 2 (two) times daily. (Patient not taking: Reported on 11/09/2018)   No facility-administered encounter medications on file as of 02/04/2019.     Observations/Objective: No direct observation  done as this was a telephone encounter.  Results for orders placed or performed in visit on 03/70/48  Basic Metabolic Panel  Result Value Ref Range   Glucose 93 65 - 99 mg/dL   BUN 29 (H) 8 - 27 mg/dL   Creatinine, Ser 1.11 (H) 0.57 - 1.00 mg/dL   GFR calc non Af Amer 48 (L) >59 mL/min/1.73   GFR calc Af Amer 56 (L) >59 mL/min/1.73   BUN/Creatinine Ratio 26 12 - 28   Sodium 141 134 - 144 mmol/L   Potassium 4.2 3.5 - 5.2 mmol/L   Chloride 104 96 - 106  mmol/L   CO2 24 20 - 29 mmol/L   Calcium 9.1 8.7 - 10.3 mg/dL  TSH  Result Value Ref Range   TSH 1.590 0.450 - 4.500 uIU/mL  POCT glucose (manual entry)  Result Value Ref Range   POC Glucose 158 (A) 70 - 99 mg/dl  POCT glycosylated hemoglobin (Hb A1C)  Result Value Ref Range   Hemoglobin A1C     HbA1c POC (<> result, manual entry)     HbA1c, POC (prediabetic range) 6.2 5.7 - 6.4 %   HbA1c, POC (controlled diabetic range)      Assessment and Plan: 1. Essential hypertension Stop HCTZ.  Will change her to chlorthalidone.  Continue other blood pressure medications.  2. Pain of left hip joint Advised patient that it is okay for her to take acetaminophen. We will get an x-ray of the left hip  3. CKD (chronic kidney disease) stage 3, GFR 30-59 ml/min (HCC) Went over lab results with patient.  Told her to avoid taking Advil, Aleve, ibuprofen, Motrin as these can make kidney function worse.  4. Dermatitis We will try her with some steroid cream.  Patient advised if it does not get better or gets any worse she should come and see me as a urgent care appointment. - triamcinolone cream (KENALOG) 0.1 %; Apply to affected area BID  Dispense: 30 g; Refill: 0   Follow Up Instructions: 2 mths   I discussed the assessment and treatment plan with the patient. The patient was provided an opportunity to ask questions and all were answered. The patient agreed with the plan and demonstrated an understanding of the instructions.   The patient was advised to call back or seek an in-person evaluation if the symptoms worsen or if the condition fails to improve as anticipated.  I provided 14 minutes of non-face-to-face time during this encounter.   Karle Plumber, MD

## 2019-02-22 ENCOUNTER — Other Ambulatory Visit: Payer: Self-pay | Admitting: Internal Medicine

## 2019-02-22 DIAGNOSIS — E038 Other specified hypothyroidism: Secondary | ICD-10-CM

## 2019-03-09 ENCOUNTER — Other Ambulatory Visit: Payer: Self-pay | Admitting: Internal Medicine

## 2019-03-09 DIAGNOSIS — J302 Other seasonal allergic rhinitis: Secondary | ICD-10-CM

## 2019-03-16 ENCOUNTER — Other Ambulatory Visit: Payer: Self-pay | Admitting: Internal Medicine

## 2019-03-16 ENCOUNTER — Ambulatory Visit: Payer: Medicare Other | Attending: Internal Medicine | Admitting: Internal Medicine

## 2019-03-16 ENCOUNTER — Other Ambulatory Visit: Payer: Self-pay

## 2019-03-16 ENCOUNTER — Encounter: Payer: Self-pay | Admitting: Internal Medicine

## 2019-03-16 DIAGNOSIS — I10 Essential (primary) hypertension: Secondary | ICD-10-CM

## 2019-03-16 DIAGNOSIS — M25551 Pain in right hip: Secondary | ICD-10-CM

## 2019-03-16 DIAGNOSIS — M25552 Pain in left hip: Secondary | ICD-10-CM

## 2019-03-16 DIAGNOSIS — E118 Type 2 diabetes mellitus with unspecified complications: Secondary | ICD-10-CM

## 2019-03-16 DIAGNOSIS — R682 Dry mouth, unspecified: Secondary | ICD-10-CM | POA: Diagnosis not present

## 2019-03-16 DIAGNOSIS — M170 Bilateral primary osteoarthritis of knee: Secondary | ICD-10-CM

## 2019-03-16 DIAGNOSIS — E119 Type 2 diabetes mellitus without complications: Secondary | ICD-10-CM

## 2019-03-16 MED ORDER — LISINOPRIL 20 MG PO TABS: ORAL_TABLET | ORAL | 2 refills | Status: DC

## 2019-03-16 MED ORDER — CHLORTHALIDONE 25 MG PO TABS: 12.5000 mg | ORAL_TABLET | Freq: Every day | ORAL | 2 refills | Status: DC

## 2019-03-16 MED ORDER — CARVEDILOL 25 MG PO TABS: ORAL_TABLET | ORAL | 2 refills | Status: DC

## 2019-03-16 NOTE — Progress Notes (Signed)
Virtual Visit via Telephone Note Due to current restrictions/limitations of in-office visits due to the COVID-19 pandemic, this scheduled clinical appointment was converted to a telehealth visit  I connected with Wendy Petty on 03/16/19 at 11:08 a.m by telephone and verified that I am speaking with the correct person using two identifiers. I am in my office.  The patient is at home.  The patient, myself and [place] [phone]) from [facility] Interpretersparticipated in this encounter.  I discussed the limitations, risks, security and privacy concerns of performing an evaluation and management service by telephone and the availability of in person appointments. I also discussed with the patient that there may be a patient responsible charge related to this service. The patient expressed understanding and agreed to proceed.   History of Present Illness: Hx ofHTN, DM, CKD 3, hypothyroid, GERD, coronary atherosclerosis, HL,lacunar CVA,OA back, hip and knees.   Pt reports she is still having pain in LT hip and now also in RT.  Intermittent pain and swelling in knees. -no falls.  "I walk slow because I'm afraid I might fall."   Rates pain 9/10 but goes to 2/10 when she takes Tylenol.  Effect of Tylenol last 2-3 hrs.  Pain is worse with ambulation -Taking Tylenol once a day and uses Voltaren gel once a day -I had submitted a referral for her to have an x-ray done of the left hip when I spoke with her in May.  Patient states she misunderstood the instructions  C/o intermittent dry mouth that has been worse over the past 1 mth Endorses frequent urination.  Wakes about 3-4 x at nights.  Pt is on Chlorthalidone Hx of DM that is diet control.  She does not check BS  HTN: Requests refills on all blood pressure medications.  Outpatient Encounter Medications as of 03/16/2019  Medication Sig  . acetaminophen (TYLENOL 8 HOUR) 650 MG CR tablet Take 1 tablet (650 mg total) by mouth every 8 (eight) hours as  needed for pain.  Marland Kitchen atorvastatin (LIPITOR) 20 MG tablet Take 1 tablet (20 mg total) by mouth daily.  . carvedilol (COREG) 25 MG tablet TAKE 1 TABLET BY MOUTH TWICE DAILY WITH FOOD  . chlorthalidone (HYGROTON) 25 MG tablet Take 0.5 tablets (12.5 mg total) by mouth daily. Stop hydrochlorothiazide.  . clopidogrel (PLAVIX) 75 MG tablet TAKE 1 TABLET BY MOUTH DAILY  . diclofenac sodium (VOLTAREN) 1 % GEL Apply 2 g topically 4 (four) times daily.  . fluticasone (FLONASE) 50 MCG/ACT nasal spray SHAKE LIQUID AND USE 1 SPRAY IN EACH NOSTRIL DAILY AS NEEDED FOR ALLERGIES OR RHINITIS  . levothyroxine (SYNTHROID) 25 MCG tablet TAKE 1 TABLET BY MOUTH ONCE DAILY  . lisinopril (ZESTRIL) 20 MG tablet TAKE 1 TABLET (20 MG TOTAL) BY MOUTH DAILY. DOSE INCREASE  . loratadine (CLARITIN) 10 MG tablet Take 1 tablet (10 mg total) by mouth daily as needed for allergies.  . memantine (NAMENDA) 10 MG tablet TAKE 1 TABLET BY MOUTH DAILY  . nitroGLYCERIN (NITROSTAT) 0.4 MG SL tablet Place 1 tablet (0.4 mg total) under the tongue every 5 (five) minutes as needed for chest pain.  Marland Kitchen omeprazole (PRILOSEC) 20 MG capsule Take 1 capsule (20 mg total) by mouth daily.  Marland Kitchen triamcinolone cream (KENALOG) 0.1 % Apply to affected area BID   No facility-administered encounter medications on file as of 03/16/2019.     Observations/Objective: No direct observation done as this was a telephone encounter.  Assessment and Plan: 1. Hip pain, bilateral  -  Advised patient that she can take the Tylenol up to 3 times a day as needed. I would like for her to get x-rays of the hips and the knees.  I gave detailed instructions of how to go to [facility] radiology department to have this done.  Her daughter will take her. Will refer her to orthopedics to see whether she would benefit from having injections done to the knees.   - DG Hip Unilat W OR W/O Pelvis Min 4 Views Left; Future - DG Hip Unilat W OR W/O Pelvis Min 4 Views Right;  Future - Ambulatory referral to Orthopedic Surgery  2. Primary osteoarthritis of both knees See #1 above - DG Knee Complete 4 Views Left; Future - DG Knee Complete 4 Views Right; Future - Ambulatory referral to Orthopedic Surgery  3. Essential hypertension - carvedilol (COREG) 25 MG tablet; TAKE 1 TABLET BY MOUTH TWICE DAILY WITH FOOD  Dispense: 180 tablet; Refill: 2 - chlorthalidone (HYGROTON) 25 MG tablet; Take 0.5 tablets (12.5 mg total) by mouth daily. Stop hydrochlorothiazide.  Dispense: 90 tablet; Refill: 2 - lisinopril (ZESTRIL) 20 MG tablet; TAKE 1 TABLET (20 MG TOTAL) BY MOUTH DAILY. DOSE INCREASE  Dispense: 90 tablet; Refill: 2  4. Dry mouth Patient to come to the lab to have blood test done including an A1c.  Recommend sucking on sugar-free candy and trying Biotene mouthwash over-the-counter  5. Controlled type 2 diabetes mellitus with complication, without long-term current use of insulin (HCC) - Comprehensive metabolic panel; Future - Hemoglobin A1c; Future   Follow Up Instructions: F/u in 2 mths   I discussed the assessment and treatment plan with the patient. The patient was provided an opportunity to ask questions and all were answered. The patient agreed with the plan and demonstrated an understanding of the instructions.   The patient was advised to call back or seek an in-person evaluation if the symptoms worsen or if the condition fails to improve as anticipated.  I provided 35 minutes of non-face-to-face time during this encounter.   Karle Plumber, MD

## 2019-03-16 NOTE — Progress Notes (Signed)
Patient verified DOB Patient has  Patient has  Patient complains of pain in the hip and knees radiating down being present for and scaled currently at an 9 along with swelling. Current regimen is no longer assisting with relief. Patient complains of dry mouth being present chronically and has been more prominent the past month.

## 2019-03-17 ENCOUNTER — Ambulatory Visit: Payer: Medicare Other | Attending: Internal Medicine

## 2019-03-17 ENCOUNTER — Other Ambulatory Visit: Payer: Self-pay

## 2019-03-17 DIAGNOSIS — E118 Type 2 diabetes mellitus with unspecified complications: Secondary | ICD-10-CM

## 2019-03-18 LAB — COMPREHENSIVE METABOLIC PANEL
ALT: 14 IU/L (ref 0–32)
AST: 18 IU/L (ref 0–40)
Albumin/Globulin Ratio: 1.4 (ref 1.2–2.2)
Albumin: 4.2 g/dL (ref 3.7–4.7)
Alkaline Phosphatase: 80 IU/L (ref 39–117)
BUN/Creatinine Ratio: 25 (ref 12–28)
BUN: 29 mg/dL — ABNORMAL HIGH (ref 8–27)
Bilirubin Total: 0.3 mg/dL (ref 0.0–1.2)
CO2: 23 mmol/L (ref 20–29)
Calcium: 9.2 mg/dL (ref 8.7–10.3)
Chloride: 102 mmol/L (ref 96–106)
Creatinine, Ser: 1.16 mg/dL — ABNORMAL HIGH (ref 0.57–1.00)
GFR calc Af Amer: 52 mL/min/{1.73_m2} — ABNORMAL LOW (ref >59)
GFR calc non Af Amer: 46 mL/min/{1.73_m2} — ABNORMAL LOW (ref >59)
Globulin, Total: 2.9 g/dL (ref 1.5–4.5)
Glucose: 108 mg/dL — ABNORMAL HIGH (ref 65–99)
Potassium: 3.9 mmol/L (ref 3.5–5.2)
Sodium: 140 mmol/L (ref 134–144)
Total Protein: 7.1 g/dL (ref 6.0–8.5)

## 2019-03-18 LAB — HEMOGLOBIN A1C
Est. average glucose Bld gHb Est-mCnc: 128 mg/dL
Hgb A1c MFr Bld: 6.1 % — ABNORMAL HIGH (ref 4.8–5.6)

## 2019-03-19 ENCOUNTER — Telehealth: Payer: Self-pay | Admitting: *Deleted

## 2019-03-19 NOTE — Telephone Encounter (Signed)
Patient verified DOB Patient is aware of DM being controlled and kidney function being stable and liver function being normal. No further questions.

## 2019-03-19 NOTE — Telephone Encounter (Signed)
-----   Message from [name], MD sent at 03/18/2019 12:30 PM EDT ----- Let patient know that her diabetes is still well controlled.  Kidney function is not 100% but has remained stable.  Liver function tests are normal.

## 2019-03-22 ENCOUNTER — Ambulatory Visit (HOSPITAL_COMMUNITY)
Admission: RE | Admit: 2019-03-22 | Discharge: 2019-03-22 | Disposition: A | Discharge status: Home / Self Care | Payer: Medicare Other | Source: Ambulatory Visit | Attending: Internal Medicine | Admitting: Internal Medicine

## 2019-03-22 ENCOUNTER — Other Ambulatory Visit: Payer: Self-pay

## 2019-03-22 ENCOUNTER — Other Ambulatory Visit: Payer: Self-pay | Admitting: Internal Medicine

## 2019-03-22 DIAGNOSIS — M25561 Pain in right knee: Secondary | ICD-10-CM | POA: Diagnosis not present

## 2019-03-22 DIAGNOSIS — M25552 Pain in left hip: Secondary | ICD-10-CM | POA: Diagnosis not present

## 2019-03-22 DIAGNOSIS — M25551 Pain in right hip: Secondary | ICD-10-CM

## 2019-03-22 DIAGNOSIS — M160 Bilateral primary osteoarthritis of hip: Secondary | ICD-10-CM | POA: Diagnosis not present

## 2019-03-22 DIAGNOSIS — M170 Bilateral primary osteoarthritis of knee: Secondary | ICD-10-CM | POA: Diagnosis not present

## 2019-03-23 ENCOUNTER — Ambulatory Visit (INDEPENDENT_AMBULATORY_CARE_PROVIDER_SITE_OTHER): Payer: Medicare Other | Admitting: Orthopaedic Surgery

## 2019-03-23 ENCOUNTER — Encounter: Payer: Self-pay | Admitting: Orthopaedic Surgery

## 2019-03-23 DIAGNOSIS — M160 Bilateral primary osteoarthritis of hip: Secondary | ICD-10-CM | POA: Diagnosis not present

## 2019-03-23 DIAGNOSIS — M170 Bilateral primary osteoarthritis of knee: Secondary | ICD-10-CM | POA: Diagnosis not present

## 2019-03-23 DIAGNOSIS — M533 Sacrococcygeal disorders, not elsewhere classified: Secondary | ICD-10-CM | POA: Diagnosis not present

## 2019-03-23 MED ORDER — BUPIVACAINE HCL 0.25 % IJ SOLN
2.0000 mL | INTRAMUSCULAR | Status: AC | PRN
  Administered 2019-03-23: 2 mL via INTRA_ARTICULAR

## 2019-03-23 MED ORDER — LIDOCAINE HCL 1 % IJ SOLN
2.0000 mL | INTRAMUSCULAR | Status: AC | PRN
  Administered 2019-03-23: 2 mL

## 2019-03-23 MED ORDER — METHYLPREDNISOLONE ACETATE 40 MG/ML IJ SUSP
40.0000 mg | INTRAMUSCULAR | Status: AC | PRN
  Administered 2019-03-23: 40 mg via INTRA_ARTICULAR

## 2019-03-23 NOTE — Progress Notes (Signed)
Office Visit Note   Patient: Wendy Petty           Date of Birth: April 09, 1941           MRN: [id number] Visit Date: 03/23/2019              Requested by: [name], MD [address] [place],  [place] [address] PCP: [name], MD   Assessment & Plan: Visit Diagnoses:  1. Bilateral primary osteoarthritis of knee   2. Bilateral primary osteoarthritis of hip   3. Sacroiliac joint pain     Plan: Impression is bilateral knee hip and SI joint osteoarthritis.  The knees seem to be most symptomatic.  Due to the patient being diabetic we will inject the right more symptomatic knee today with cortisone.  She will follow-up with [place] in 2 weeks time to inject the left knee with cortisone.  If she continues to have pain, we will likely refer her to Dr. Ernestina Patches for SI joint injections as this appears to be more symptomatic than her hip osteoarthritis.  She will call with concerns or questions in the meantime.  This was all translated through a Spanish-speaking interpreter.  Follow-Up Instructions: Return in about 2 weeks (around 04/06/2019) for left knee injection.   Orders:  Orders Placed This Encounter  Procedures   Large Joint Inj: R knee   No orders of the defined types were placed in this encounter.     Procedures: Large Joint Inj: R knee on 03/23/2019 9:44 AM Indications: pain Details: 22 G needle, anterolateral approach Medications: 2 mL bupivacaine 0.25 %; 2 mL lidocaine 1 %; 40 mg methylPREDNISolone acetate 40 MG/ML      Clinical Data: No additional findings.   Subjective: Chief Complaint  Patient presents with   Right Knee - Pain   Left Knee - Pain   Lower Back - Pain    HPI patient is a 78 year old Spanish-speaking female who presents our clinic today with bilateral knee pain, thigh pain and lower back pain x8 years.  She notes increased pain over the past 2 years to both knees right greater than left.  No injury or change in activity.   The pain she has is to bilateral lower back,  lateral and anterior aspect of the thigh and into the medial knee.  Pain is described as a constant ache with associated cramps in both lower leg.  Pain is increased at night as well as with activity.  She takes Tylenol with mild to moderate relief of symptoms.  She does note burning to plantar aspect of both feet.  She notes previous injections into her back.  No previous cortisone injections to her hip joints or knees.  Review of Systems as detailed in HPI.  All others reviewed and are negative.   Objective: Vital Signs: There were no vitals taken for this visit.  Physical Exam well-developed well-nourished female no acute distress.  Alert and oriented x3.  Ortho Exam examination of her knees reveals trace effusion both sides.  Range of motion 0 to 110 degrees.  Marked tenderness medial joint line.  Moderate patellofemoral crepitus.  Ligaments are stable.  Negative logroll both sides.  Positive straight leg raise both sides.  She has mildly increased pain with lumbar flexion.  Tenderness to bilateral SI joints.  No focal weakness.  She is neurovascularly intact distally.  Specialty Comments:  No specialty comments available.  Imaging: Dg Knee Complete 4 Views Left  Result Date:  03/22/2019 CLINICAL DATA:  Chronic left knee pain. EXAM: LEFT KNEE - COMPLETE 4+ VIEW COMPARISON:  04/24/2015 FINDINGS: No acute fracture or malalignment. Tricompartmental degenerative changes, moderate within the medial compartment where there is joint space narrowing and marginal osteophyte formation. Finding has progressed compared to prior x-ray. No knee joint effusion. Small superior patellar enthesophyte. Scattered non-specific soft tissue calcifications. IMPRESSION: Moderate medial compartment osteoarthritis of the left knee. Electronically Signed   By: Davina Poke   On: 03/22/2019 16:07   Dg Knee Complete 4 Views Right  Result Date: 03/22/2019 CLINICAL DATA:   Chronic right knee pain EXAM: RIGHT KNEE - COMPLETE 4+ VIEW COMPARISON:  04/24/2015 FINDINGS: No acute fracture or malalignment. Tricompartmental degenerative changes, moderate within the medial compartment, manifested by joint space narrowing and prominent marginal osteophyte formation. Finding has progressed compared to prior study. No knee joint effusion. Small superior patellar enthesophyte. IMPRESSION: Moderate medial compartment osteoarthritis of the right knee. Electronically Signed   By: Davina Poke   On: 03/22/2019 16:11   Dg Hips Bilat With Pelvis 3-4 Views  Result Date: 03/22/2019 CLINICAL DATA:  Chronic bilateral hip pain. EXAM: DG HIP (WITH OR WITHOUT PELVIS) 3-4V BILAT COMPARISON:  None. FINDINGS: No acute fracture or malalignment. Pelvic bony ring is intact. There are moderate degenerative changes of the bilateral hip joints manifested by joint space narrowing and marginal osteophyte formation. Mild-to-moderate degenerative changes are also present at the bilateral SI joints and pubic symphysis. Partially visualized lower lumbar spondylosis. IMPRESSION: Moderate degenerative changes of the bilateral hips. No acute findings. Electronically Signed   By: Davina Poke   On: 03/22/2019 16:05     PMFS History: Patient Active Problem List   Diagnosis Date Noted   Seasonal allergies 11/09/2018   Nocturnal muscle cramp 11/09/2018   Breast mass, right 11/23/2015   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/16/2015   Ear noise/buzzing 10/30/2015   Chronic low back pain 10/30/2015   Type 2 diabetes mellitus with microalbuminuria ([place]) 09/29/2015   Poorly fitting dentures 09/29/2015   Hypothyroidism 09/29/2015   Primary osteoarthritis of both knees 09/29/2015   Spondylolisthesis 07/19/2015   Overweight (BMI 25.0-29.9) 06/29/2015   Memory deficit 11/28/2014   Coronary atherosclerosis of native coronary artery 06/24/2013   History of TIAs 06/24/2013   GERD  (gastroesophageal reflux disease) 06/24/2013   Depression 12/31/2012   THYROID NODULE 02/14/2009   Elevated lipids 02/14/2009   ANEMIA, CHRONIC 02/14/2009   Essential hypertension 02/14/2009   PULMONARY NODULE 02/14/2009   Past Medical History:  Diagnosis Date   ANEMIA, CHRONIC    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/16/2015   Breast mass, right 11/23/2015   CHEST PAIN UNSPECIFIED    Coronary atherosclerosis of native coronary artery 06/24/2013   Depression 12/31/2012   Diabetes mellitus without complication ([place]) 30/0923   Edema    GERD (gastroesophageal reflux disease)    History of TIAs 06/24/2013   Hx of cardiovascular stress test    Lexiscan Myoview (11/14):  No ischemia, EF 68% (normal study)   HYPERLIPIDEMIA    Hypertension    Hypothyroidism 09/29/2015   Memory deficit 11/28/2014   Primary osteoarthritis of both knees 09/29/2015   PULMONARY NODULE    THYROID NODULE    Type 2 diabetes mellitus with microalbuminuria ([place]) 09/29/2015    Family History  Problem Relation Age of Onset   Heart disease Mother    Cancer Father        a tumor ruptured in his abd.  Past Surgical History:  Procedure Laterality Date   LEFT HEART CATHETERIZATION WITH CORONARY ANGIOGRAM N/A 09/16/2011   Procedure: LEFT HEART CATHETERIZATION WITH CORONARY ANGIOGRAM;  Surgeon: [name], MD;  Location: [facility] C[facility] LAB;  Service: Cardiovascular;  Laterality: N/A;   TUBAL LIGATION     VAGINAL HYSTERECTOMY  1989   Social History   Occupational History   Not on file  Tobacco Use   Smoking status: Never Smoker   Smokeless tobacco: Never Used  Substance and Sexual Activity   Alcohol use: No    Alcohol/week: 0.0 standard drinks   Drug use: No   Sexual activity: Never

## 2019-03-26 ENCOUNTER — Telehealth: Payer: Self-pay

## 2019-03-26 NOTE — Telephone Encounter (Signed)
[employer] Benjamine Mola  Id# [id number]  contacted pt to go over lab results pt didn't answer left a detailed vm informing pt of results and if she has an questions or concerns to give me a call

## 2019-04-16 ENCOUNTER — Other Ambulatory Visit: Payer: Self-pay | Admitting: Internal Medicine

## 2019-04-16 DIAGNOSIS — R413 Other amnesia: Secondary | ICD-10-CM

## 2019-04-30 ENCOUNTER — Telehealth: Payer: Self-pay | Admitting: Internal Medicine

## 2019-04-30 DIAGNOSIS — K219 Gastro-esophageal reflux disease without esophagitis: Secondary | ICD-10-CM

## 2019-04-30 DIAGNOSIS — L309 Dermatitis, unspecified: Secondary | ICD-10-CM

## 2019-04-30 MED ORDER — OMEPRAZOLE 20 MG PO CPDR: 20.0000 mg | DELAYED_RELEASE_CAPSULE | Freq: Every day | ORAL | 2 refills | Status: DC

## 2019-04-30 MED ORDER — TRIAMCINOLONE ACETONIDE 0.1 % EX CREA: TOPICAL_CREAM | CUTANEOUS | 0 refills | Status: DC

## 2019-04-30 NOTE — Telephone Encounter (Signed)
Patient has refills at her pharmacy, she can obtain from them. Will send in refills for the two she does not have, Kenalog and Prilosec.

## 2019-04-30 NOTE — Telephone Encounter (Signed)
1) Medication(s) Requested (by name): lisinopril (ZESTRIL) 20 MG tablet [id number]  levothyroxine (SYNTHROID) 25 MCG tablet [id number]  carvedilol (COREG) 25 MG tablet [id number]  fluticasone (FLONASE) 50 MCG/ACT nasal spray [id number]  omeprazole (PRILOSEC) 20 MG capsule [id number]  triamcinolone cream (KENALOG) 0.1 % [id number]  chlorthalidone (HYGROTON) 25 MG tablet [id number]  clopidogrel (PLAVIX) 75 MG tablet [id number]    2) Pharmacy of Choice: [facility]  3) Special Requests:   Approved medications will be sent to the pharmacy, we will reach out if there is an issue.  Requests made after 3pm may not be addressed until the following business day!  If a patient is unsure of the name of the medication(s) please note and ask patient to call back when they are able to provide all info, do not send to responsible party until all information is available!

## 2019-05-04 ENCOUNTER — Other Ambulatory Visit: Payer: Self-pay | Admitting: Internal Medicine

## 2019-05-04 ENCOUNTER — Ambulatory Visit: Payer: Medicare Other | Admitting: Orthopaedic Surgery

## 2019-05-04 DIAGNOSIS — E785 Hyperlipidemia, unspecified: Secondary | ICD-10-CM

## 2019-05-18 ENCOUNTER — Other Ambulatory Visit: Payer: Self-pay | Admitting: Internal Medicine

## 2019-05-18 DIAGNOSIS — E038 Other specified hypothyroidism: Secondary | ICD-10-CM

## 2019-06-01 ENCOUNTER — Ambulatory Visit (INDEPENDENT_AMBULATORY_CARE_PROVIDER_SITE_OTHER): Payer: Medicare Other | Admitting: Orthopaedic Surgery

## 2019-06-01 ENCOUNTER — Other Ambulatory Visit: Payer: Self-pay

## 2019-06-01 DIAGNOSIS — M1712 Unilateral primary osteoarthritis, left knee: Secondary | ICD-10-CM | POA: Diagnosis not present

## 2019-06-01 MED ORDER — BUPIVACAINE HCL 0.5 % IJ SOLN
2.0000 mL | INTRAMUSCULAR | Status: AC | PRN
  Administered 2019-06-01: 2 mL via INTRA_ARTICULAR

## 2019-06-01 MED ORDER — METHYLPREDNISOLONE ACETATE 40 MG/ML IJ SUSP
40.0000 mg | INTRAMUSCULAR | Status: AC | PRN
  Administered 2019-06-01: 40 mg via INTRA_ARTICULAR

## 2019-06-01 MED ORDER — LIDOCAINE HCL 1 % IJ SOLN
2.0000 mL | INTRAMUSCULAR | Status: AC | PRN
  Administered 2019-06-01: 2 mL

## 2019-06-01 NOTE — Progress Notes (Signed)
Office Visit Note   Patient: Wendy Petty           Date of Birth: [birth year]           MRN: [id number] Visit Date: 06/01/2019              Requested by: [name], MD [address] [place],  [place] [address] PCP: [name], MD   Assessment & Plan: Visit Diagnoses:  1. Primary osteoarthritis of left knee     Plan: Cortisone injection performed for the left knee today.  Patient tolerated this well.  Follow-up as needed.  Follow-Up Instructions: Return if symptoms worsen or fail to improve.   Orders:  No orders of the defined types were placed in this encounter.  No orders of the defined types were placed in this encounter.     Procedures: Large Joint Inj: L knee on 06/01/2019 8:47 AM Details: 22 G needle Medications: 2 mL bupivacaine 0.5 %; 2 mL lidocaine 1 %; 40 mg methylPREDNISolone acetate 40 MG/ML Outcome: tolerated well, no immediate complications Patient was prepped and draped in the usual sterile fashion.       Clinical Data: No additional findings.   Subjective: Chief Complaint  Patient presents with  . Left Knee - Pain    Patient comes in today for left knee cortisone injection with interpreter.  She did well with the previous cortisone injection.   Review of Systems  Constitutional: Negative.   HENT: Negative.   Eyes: Negative.   Respiratory: Negative.   Cardiovascular: Negative.   Endocrine: Negative.   Musculoskeletal: Negative.   Neurological: Negative.   Hematological: Negative.   Psychiatric/Behavioral: Negative.   All other systems reviewed and are negative.    Objective: Vital Signs: There were no vitals taken for this visit.  Physical Exam Vitals signs and nursing note reviewed.  Constitutional:      Appearance: She is well-developed.  Pulmonary:     Effort: Pulmonary effort is normal.  Skin:    General: Skin is warm.     Capillary Refill: Capillary refill takes less than 2 seconds.   Neurological:     Mental Status: She is alert and oriented to person, place, and time.  Psychiatric:        Behavior: Behavior normal.        Thought Content: Thought content normal.        Judgment: Judgment normal.     Ortho Exam Left knee exam is unchanged. Specialty Comments:  No specialty comments available.  Imaging: No results found.   PMFS History: Patient Active Problem List   Diagnosis Date Noted  . Primary osteoarthritis of left knee 06/01/2019  . Seasonal allergies 11/09/2018  . Nocturnal muscle cramp 11/09/2018  . Breast mass, right 11/23/2015  . 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 Ear noise/buzzing 10/30/2015  . Chronic low back pain 10/30/2015  . Type 2 diabetes mellitus with microalbuminuria ([place]) 09/29/2015  . Poorly fitting dentures 09/29/2015  . Hypothyroidism 09/29/2015  . Primary osteoarthritis of both knees 09/29/2015  . Spondylolisthesis 07/19/2015  . Overweight (BMI 25.0-29.9) 06/29/2015  . Memory deficit 11/28/2014  . Coronary atherosclerosis of native coronary artery 06/24/2013  . History of TIAs 06/24/2013  . GERD (gastroesophageal reflux disease) 06/24/2013  . Depression 12/31/2012  . THYROID NODULE 02/14/2009  . Elevated lipids 02/14/2009  . ANEMIA, CHRONIC 02/14/2009  . Essential hypertension 02/14/2009  . PULMONARY NODULE 02/14/2009   Past Medical History:  Diagnosis Date  . ANEMIA, CHRONIC   . 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 Breast mass, right 11/23/2015  . CHEST PAIN UNSPECIFIED   . Coronary atherosclerosis of native coronary artery 06/24/2013  . Depression 12/31/2012  . Diabetes mellitus without complication ([place]) [id number]  . Edema   . GERD (gastroesophageal reflux disease)   . History of TIAs 06/24/2013  . Hx of cardiovascular stress test    Lexiscan Myoview (11/14):  No ischemia, EF 68% (normal study)  . HYPERLIPIDEMIA   . Hypertension   . Hypothyroidism 09/29/2015  . Memory deficit  11/28/2014  . Primary osteoarthritis of both knees 09/29/2015  . PULMONARY NODULE   . THYROID NODULE   . Type 2 diabetes mellitus with microalbuminuria ([place]) 09/29/2015    Family History  Problem Relation Age of Onset  . Heart disease Mother   . Cancer Father        a tumor ruptured in his abd.    Past Surgical History:  Procedure Laterality Date  . LEFT HEART CATHETERIZATION WITH CORONARY ANGIOGRAM N/A 09/16/2011   Procedure: LEFT HEART CATHETERIZATION WITH CORONARY ANGIOGRAM;  Surgeon: [name], MD;  Location: [facility] CATH LAB;  Service: Cardiovascular;  Laterality: N/A;  . TUBAL LIGATION    . VAGINAL HYSTERECTOMY  1989   Social History   Occupational History  . Not on file  Tobacco Use  . Smoking status: Never Smoker  . Smokeless tobacco: Never Used  Substance and Sexual Activity  . Alcohol use: No    Alcohol/week: 0.0 standard drinks  . Drug use: No  . Sexual activity: Never

## 2019-06-09 ENCOUNTER — Emergency Department (HOSPITAL_COMMUNITY)
Admission: EM | Admit: 2019-06-09 | Discharge: 2019-06-09 | Disposition: A | Discharge status: Home / Self Care | Payer: Medicare Other | Attending: Emergency Medicine | Admitting: Emergency Medicine

## 2019-06-09 ENCOUNTER — Emergency Department (HOSPITAL_COMMUNITY): Payer: Medicare Other

## 2019-06-09 ENCOUNTER — Other Ambulatory Visit: Payer: Self-pay

## 2019-06-09 ENCOUNTER — Ambulatory Visit (HOSPITAL_BASED_OUTPATIENT_CLINIC_OR_DEPARTMENT_OTHER): Payer: Medicare Other | Admitting: Physician Assistant

## 2019-06-09 ENCOUNTER — Encounter (HOSPITAL_COMMUNITY): Payer: Self-pay | Admitting: *Deleted

## 2019-06-09 DIAGNOSIS — Z79899 Other long term (current) drug therapy: Secondary | ICD-10-CM | POA: Diagnosis not present

## 2019-06-09 DIAGNOSIS — W010XXA Fall on same level from slipping, tripping and stumbling without subsequent striking against object, initial encounter: Secondary | ICD-10-CM | POA: Diagnosis not present

## 2019-06-09 DIAGNOSIS — I10 Essential (primary) hypertension: Secondary | ICD-10-CM | POA: Diagnosis not present

## 2019-06-09 DIAGNOSIS — E038 Other specified hypothyroidism: Secondary | ICD-10-CM | POA: Diagnosis not present

## 2019-06-09 DIAGNOSIS — M79622 Pain in left upper arm: Secondary | ICD-10-CM | POA: Diagnosis not present

## 2019-06-09 DIAGNOSIS — E785 Hyperlipidemia, unspecified: Secondary | ICD-10-CM | POA: Diagnosis not present

## 2019-06-09 DIAGNOSIS — Y939 Activity, unspecified: Secondary | ICD-10-CM | POA: Insufficient documentation

## 2019-06-09 DIAGNOSIS — S20219A Contusion of unspecified front wall of thorax, initial encounter: Secondary | ICD-10-CM | POA: Diagnosis not present

## 2019-06-09 DIAGNOSIS — R0789 Other chest pain: Secondary | ICD-10-CM

## 2019-06-09 DIAGNOSIS — M170 Bilateral primary osteoarthritis of knee: Secondary | ICD-10-CM

## 2019-06-09 DIAGNOSIS — I2510 Atherosclerotic heart disease of native coronary artery without angina pectoris: Secondary | ICD-10-CM | POA: Insufficient documentation

## 2019-06-09 DIAGNOSIS — K219 Gastro-esophageal reflux disease without esophagitis: Secondary | ICD-10-CM

## 2019-06-09 DIAGNOSIS — J302 Other seasonal allergic rhinitis: Secondary | ICD-10-CM

## 2019-06-09 DIAGNOSIS — Y999 Unspecified external cause status: Secondary | ICD-10-CM | POA: Insufficient documentation

## 2019-06-09 DIAGNOSIS — W19XXXA Unspecified fall, initial encounter: Secondary | ICD-10-CM

## 2019-06-09 DIAGNOSIS — E119 Type 2 diabetes mellitus without complications: Secondary | ICD-10-CM | POA: Diagnosis not present

## 2019-06-09 DIAGNOSIS — B353 Tinea pedis: Secondary | ICD-10-CM

## 2019-06-09 DIAGNOSIS — M79621 Pain in right upper arm: Secondary | ICD-10-CM | POA: Insufficient documentation

## 2019-06-09 DIAGNOSIS — Y929 Unspecified place or not applicable: Secondary | ICD-10-CM | POA: Diagnosis not present

## 2019-06-09 DIAGNOSIS — E039 Hypothyroidism, unspecified: Secondary | ICD-10-CM | POA: Diagnosis not present

## 2019-06-09 DIAGNOSIS — R0602 Shortness of breath: Secondary | ICD-10-CM | POA: Diagnosis not present

## 2019-06-09 DIAGNOSIS — R079 Chest pain, unspecified: Secondary | ICD-10-CM | POA: Diagnosis not present

## 2019-06-09 DIAGNOSIS — M25562 Pain in left knee: Secondary | ICD-10-CM | POA: Diagnosis not present

## 2019-06-09 DIAGNOSIS — M549 Dorsalgia, unspecified: Secondary | ICD-10-CM | POA: Insufficient documentation

## 2019-06-09 DIAGNOSIS — Z7902 Long term (current) use of antithrombotics/antiplatelets: Secondary | ICD-10-CM | POA: Insufficient documentation

## 2019-06-09 DIAGNOSIS — M25561 Pain in right knee: Secondary | ICD-10-CM | POA: Insufficient documentation

## 2019-06-09 LAB — TROPONIN I (HIGH SENSITIVITY)
Troponin I (High Sensitivity): 6 ng/L (ref <18)
Troponin I (High Sensitivity): 4 ng/L (ref <18)

## 2019-06-09 LAB — BASIC METABOLIC PANEL
Anion gap: 12 (ref 5–15)
BUN: 27 mg/dL — ABNORMAL HIGH (ref 8–23)
CO2: 23 mmol/L (ref 22–32)
Calcium: 8.8 mg/dL — ABNORMAL LOW (ref 8.9–10.3)
Chloride: 104 mmol/L (ref 98–111)
Creatinine, Ser: 1.28 mg/dL — ABNORMAL HIGH (ref 0.44–1.00)
GFR calc Af Amer: 47 mL/min — ABNORMAL LOW (ref >60)
GFR calc non Af Amer: 40 mL/min — ABNORMAL LOW (ref >60)
Glucose, Bld: 144 mg/dL — ABNORMAL HIGH (ref 70–99)
Potassium: 4.3 mmol/L (ref 3.5–5.1)
Sodium: 139 mmol/L (ref 135–145)

## 2019-06-09 LAB — CBC
HCT: 39.3 % (ref 36.0–46.0)
Hemoglobin: 12.8 g/dL (ref 12.0–15.0)
MCH: 30.2 pg (ref 26.0–34.0)
MCHC: 32.6 g/dL (ref 30.0–36.0)
MCV: 92.7 fL (ref 80.0–100.0)
Platelets: 189 10*3/uL (ref 150–400)
RBC: 4.24 MIL/uL (ref 3.87–5.11)
RDW: 12.4 % (ref 11.5–15.5)
WBC: 7.5 10*3/uL (ref 4.0–10.5)
nRBC: 0 % (ref 0.0–0.2)

## 2019-06-09 MED ORDER — CLOPIDOGREL BISULFATE 75 MG PO TABS: 75.0000 mg | ORAL_TABLET | Freq: Every day | ORAL | 1 refills | Status: DC

## 2019-06-09 MED ORDER — SODIUM CHLORIDE 0.9% FLUSH: 3.0000 mL | Freq: Once | INTRAVENOUS | Status: DC

## 2019-06-09 MED ORDER — LEVOTHYROXINE SODIUM 25 MCG PO TABS: 25.0000 ug | ORAL_TABLET | Freq: Every day | ORAL | 2 refills | Status: DC

## 2019-06-09 MED ORDER — LISINOPRIL 20 MG PO TABS: ORAL_TABLET | ORAL | 2 refills | Status: DC

## 2019-06-09 MED ORDER — OMEPRAZOLE 20 MG PO CPDR: 20.0000 mg | DELAYED_RELEASE_CAPSULE | Freq: Every day | ORAL | 2 refills | Status: DC

## 2019-06-09 MED ORDER — DICLOFENAC SODIUM 1 % TD GEL: 2.0000 g | Freq: Four times a day (QID) | TRANSDERMAL | 2 refills | Status: DC

## 2019-06-09 MED ORDER — HYDROCODONE-ACETAMINOPHEN 5-325 MG PO TABS: 1.0000 | ORAL_TABLET | Freq: Four times a day (QID) | ORAL | 0 refills | Status: DC | PRN

## 2019-06-09 MED ORDER — LORATADINE 10 MG PO TABS: 10.0000 mg | ORAL_TABLET | Freq: Every day | ORAL | 2 refills | Status: DC | PRN

## 2019-06-09 MED ORDER — CLOTRIMAZOLE-BETAMETHASONE 1-0.05 % EX CREA: 1.0000 "application " | TOPICAL_CREAM | Freq: Two times a day (BID) | CUTANEOUS | 0 refills | Status: DC

## 2019-06-09 MED ORDER — HYDROCODONE-ACETAMINOPHEN 5-325 MG PO TABS
1.0000 | ORAL_TABLET | Freq: Once | ORAL | Status: AC
  Administered 2019-06-09: 1 via ORAL
  Filled 2019-06-09: qty 1

## 2019-06-09 MED ORDER — CARVEDILOL 25 MG PO TABS: ORAL_TABLET | ORAL | 2 refills | Status: DC

## 2019-06-09 MED ORDER — CHLORTHALIDONE 25 MG PO TABS: 12.5000 mg | ORAL_TABLET | Freq: Every day | ORAL | 2 refills | Status: DC

## 2019-06-09 MED ORDER — ATORVASTATIN CALCIUM 20 MG PO TABS: ORAL_TABLET | ORAL | 0 refills | Status: DC

## 2019-06-09 NOTE — ED Provider Notes (Signed)
[facility] EMERGENCY DEPARTMENT Provider Note   CSN: [id number] Arrival date & time: 06/09/19  1637     History   Chief Complaint Chief Complaint  Patient presents with  . Fall    HPI Wendy Petty is a 78 y.o. female.     Patient presents to the emergency department with a chief complaint of left-sided chest wall pain.  She states that she tripped and fell a couple of days ago.  States that she fell on outstretched arms in front of her, but still struck her chest on the ground.  She states that she has had some persistent pain on the left chest wall.  It is worsened with palpation and with movement.  She reports some increased pain with deep breathing.  She states that the pain sometimes takes her breath away.  She states that sometimes she feels the pain in her shoulder.  Additionally, she complains of chronic bilateral knee pain, for which she recently had cortisone injections.  She believes that her knee pain was contributory to her falling/tripping.  The history is provided by the patient. No language interpreter was used.    Past Medical History:  Diagnosis Date  . ANEMIA, CHRONIC   . 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 Breast mass, right 11/23/2015  . CHEST PAIN UNSPECIFIED   . Coronary atherosclerosis of native coronary artery 06/24/2013  . Depression 12/31/2012  . Diabetes mellitus without complication ([place]) [id number]  . Edema   . GERD (gastroesophageal reflux disease)   . History of TIAs 06/24/2013  . Hx of cardiovascular stress test    Lexiscan Myoview (11/14):  No ischemia, EF 68% (normal study)  . HYPERLIPIDEMIA   . Hypertension   . Hypothyroidism 09/29/2015  . Memory deficit 11/28/2014  . Primary osteoarthritis of both knees 09/29/2015  . PULMONARY NODULE   . THYROID NODULE   . Type 2 diabetes mellitus with microalbuminuria ([place]) 09/29/2015    Patient Active Problem List   Diagnosis Date Noted  . Primary osteoarthritis  of left knee 06/01/2019  . Seasonal allergies 11/09/2018  . Nocturnal muscle cramp 11/09/2018  . Breast mass, right 11/23/2015  . 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 Ear noise/buzzing 10/30/2015  . Chronic low back pain 10/30/2015  . Type 2 diabetes mellitus with microalbuminuria ([place]) 09/29/2015  . Poorly fitting dentures 09/29/2015  . Hypothyroidism 09/29/2015  . Primary osteoarthritis of both knees 09/29/2015  . Spondylolisthesis 07/19/2015  . Overweight (BMI 25.0-29.9) 06/29/2015  . Memory deficit 11/28/2014  . Coronary atherosclerosis of native coronary artery 06/24/2013  . History of TIAs 06/24/2013  . GERD (gastroesophageal reflux disease) 06/24/2013  . Depression 12/31/2012  . THYROID NODULE 02/14/2009  . Elevated lipids 02/14/2009  . ANEMIA, CHRONIC 02/14/2009  . Essential hypertension 02/14/2009  . PULMONARY NODULE 02/14/2009    Past Surgical History:  Procedure Laterality Date  . LEFT HEART CATHETERIZATION WITH CORONARY ANGIOGRAM N/A 09/16/2011   Procedure: LEFT HEART CATHETERIZATION WITH CORONARY ANGIOGRAM;  Surgeon: [name], MD;  Location: [facility] CATH LAB;  Service: Cardiovascular;  Laterality: N/A;  . TUBAL LIGATION    . VAGINAL HYSTERECTOMY  1989     OB History   No obstetric history on file.      Home Medications    Prior to Admission medications   Medication Sig Start Date End Date Taking? Authorizing Provider  acetaminophen (TYLENOL 8 HOUR) 650 MG CR tablet Take 1 tablet (650 mg total)  by mouth every 8 (eight) hours as needed for pain. 06/20/17   [name], MD  atorvastatin (LIPITOR) 20 MG tablet TAKE 1 TABLET(20 MG) BY MOUTH DAILY 06/09/19   Freeman Caldron M, PA-C  carvedilol (COREG) 25 MG tablet TAKE 1 TABLET BY MOUTH TWICE DAILY WITH FOOD 06/09/19   Freeman Caldron M, PA-C  chlorthalidone (HYGROTON) 25 MG tablet Take 0.5 tablets (12.5 mg total) by mouth daily. Stop hydrochlorothiazide. 06/09/19   Argentina Donovan, PA-C   clopidogrel (PLAVIX) 75 MG tablet Take 1 tablet (75 mg total) by mouth daily. 06/09/19   Argentina Donovan, PA-C  clotrimazole-betamethasone (LOTRISONE) cream Apply 1 application topically 2 (two) times daily. 06/09/19   Argentina Donovan, PA-C  diclofenac sodium (VOLTAREN) 1 % GEL Apply 2 g topically 4 (four) times daily. 06/09/19   Argentina Donovan, PA-C  fluticasone (FLONASE) 50 MCG/ACT nasal spray SHAKE LIQUID AND USE 1 SPRAY IN EACH NOSTRIL DAILY AS NEEDED FOR ALLERGIES OR RHINITIS 03/09/19   [name], MD  HYDROcodone-acetaminophen (NORCO/VICODIN) 5-325 MG tablet Take 1 tablet by mouth every 6 (six) hours as needed. 06/09/19   Montine Circle, PA-C  levothyroxine (SYNTHROID) 25 MCG tablet Take 1 tablet (25 mcg total) by mouth daily. 06/09/19   Argentina Donovan, PA-C  lisinopril (ZESTRIL) 20 MG tablet TAKE 1 TABLET (20 MG TOTAL) BY MOUTH DAILY. DOSE INCREASE 06/09/19   Freeman Caldron M, PA-C  loratadine (CLARITIN) 10 MG tablet Take 1 tablet (10 mg total) by mouth daily as needed for allergies. 06/09/19   Argentina Donovan, PA-C  memantine (NAMENDA) 10 MG tablet TAKE 1 TABLET BY MOUTH DAILY 04/19/19   [name], MD  nitroGLYCERIN (NITROSTAT) 0.4 MG SL tablet Place 1 tablet (0.4 mg total) under the tongue every 5 (five) minutes as needed for chest pain. 06/20/17   [name], MD  omeprazole (PRILOSEC) 20 MG capsule Take 1 capsule (20 mg total) by mouth daily. 06/09/19   Argentina Donovan, PA-C  triamcinolone cream (KENALOG) 0.1 % Apply to affected area BID 04/30/19   [name], MD    Family History Family History  Problem Relation Age of Onset  . Heart disease Mother   . Cancer Father        a tumor ruptured in his abd.    Social History Social History   Tobacco Use  . Smoking status: Never Smoker  . Smokeless tobacco: Never Used  Substance Use Topics  . Alcohol use: No    Alcohol/week: 0.0 standard drinks  . Drug use: No     Allergies    Hydrochlorothiazide   Review of Systems Review of Systems  All other systems reviewed and are negative.    Physical Exam Updated Vital Signs BP (!) 178/89 (BP Location: Left Arm)   Pulse 70   Temp (!) 97.5 F (36.4 C) (Oral)   Resp 18   Ht 5' (1.524 m)   Wt 66.2 kg   SpO2 100%   BMI 28.50 kg/m   Physical Exam Vitals signs and nursing note reviewed.  Constitutional:      General: She is not in acute distress.    Appearance: She is well-developed.  HENT:     Head: Normocephalic and atraumatic.  Eyes:     Conjunctiva/sclera: Conjunctivae normal.  Neck:     Musculoskeletal: Neck supple.  Cardiovascular:     Rate and Rhythm: Normal rate and regular rhythm.     Heart sounds: No  murmur.     Comments: Mild anterior left chest wall tenderness  Pulmonary:     Effort: Pulmonary effort is normal. No respiratory distress.     Breath sounds: Normal breath sounds.     Comments: CTAB Abdominal:     Palpations: Abdomen is soft.     Tenderness: There is no abdominal tenderness.  Musculoskeletal: Normal range of motion.     Comments: Range of motion and strength 5/5, no swollen or erythematous joints  Skin:    General: Skin is warm and dry.  Neurological:     Mental Status: She is alert and oriented to person, place, and time.  Psychiatric:        Mood and Affect: Mood normal.        Behavior: Behavior normal.      ED Treatments / Results  Labs (all labs ordered are listed, but only abnormal results are displayed) Labs Reviewed  BASIC METABOLIC PANEL - Abnormal; Notable for the following components:      Result Value   Glucose, Bld 144 (*)    BUN 27 (*)    Creatinine, Ser 1.28 (*)    Calcium 8.8 (*)    GFR calc non Af Amer 40 (*)    GFR calc Af Amer 47 (*)    All other components within normal limits  CBC  TROPONIN I (HIGH SENSITIVITY)  TROPONIN I (HIGH SENSITIVITY)    EKG None ED ECG REPORT  I personally interpreted this EKG   Date: 06/09/2019   Rate: 78   Rhythm: normal sinus rhythm  QRS Axis: normal  Intervals: normal  ST/T Wave abnormalities: normal  Conduction Disutrbances:none  Narrative Interpretation:   Old EKG Reviewed: Unchanged   Radiology Dg Chest 2 View  Result Date: 06/09/2019 CLINICAL DATA:  Dizziness. Recent fall. Chest pain. EXAM: CHEST - 2 VIEW COMPARISON:  05/07/2018 FINDINGS: Stable mild cardiomegaly. Ectasia and atherosclerotic calcification of thoracic aorta also unchanged. Both lungs are clear. No evidence of pneumothorax or pleural effusion. IMPRESSION: Stable mild cardiomegaly. No active lung disease. Electronically Signed   By: Marlaine Hind M.D.   On: 06/09/2019 17:18    Procedures Procedures (including critical care time)  Medications Ordered in ED Medications  sodium chloride flush (NS) 0.9 % injection 3 mL (has no administration in time range)  HYDROcodone-acetaminophen (NORCO/VICODIN) 5-325 MG per tablet 1 tablet (has no administration in time range)     Initial Impression / Assessment and Plan / ED Course  I have reviewed the triage vital signs and the nursing notes.  Pertinent labs & imaging results that were available during my care of the patient were reviewed by me and considered in my medical decision making (see chart for details).        Patient with left anterior chest wall pain.  It is reproducible with palpation.  Patient has been having ongoing pain since having a mechanical fall, after she tripped and fell on outstretched arms, but also bumped her chest.  Her lungs sounds are clear.  Chest x-ray is negative.  There is no obvious rib fracture.  She could have contusion versus radio occult fracture.  Discussed potential CT imaging, at this time patient declines.  O2 saturation is 100%.  She is not tachycardic. Pressures are noted to be high, but could be 2/2 pain/stress.  She is not orthostatic.  Advised her to continue her normal BP meds in the AM. Laboratory work-up is otherwise reassuring,  and at or near patient's baseline.  We will treat symptomatically, discharged home with PCP follow-up.  Patient understands and agrees the plan.  Patient seen by and discussed with Dr. Christy Gentles.  Final Clinical Impressions(s) / ED Diagnoses   Final diagnoses:  Fall, initial encounter  Chest wall pain    ED Discharge Orders         Ordered    HYDROcodone-acetaminophen (NORCO/VICODIN) 5-325 MG tablet  Every 6 hours PRN     06/09/19 2319           Montine Circle, PA-C 06/09/19 2344    Ripley Fraise, MD 06/10/19 0121

## 2019-06-09 NOTE — ED Triage Notes (Signed)
The pt fell 3-4 days ago  And since then she has been dizzy and she has pain in her chest with bi-lateral shoulder pain

## 2019-06-09 NOTE — ED Provider Notes (Addendum)
Patient seen/examined in the Emergency Department in conjunction with Advanced Practice Provider [facility] Patient reports recent fall with pain in upper extremities and upper back Exam : awake/alert, no acute distress, watching TV.  No focal tenderness/deformities to upper extremities.  Plan: pt likely to be discharged as pt is in no distress no obvious injuries     Ripley Fraise, MD 06/09/19 [id number]    Ripley Fraise, MD 06/09/19 [phone]

## 2019-06-09 NOTE — ED Notes (Signed)
Pt does not want to put a gown on for examination.

## 2019-06-09 NOTE — Progress Notes (Signed)
Virtual Visit via Telephone Note  I connected with Wendy Petty on 06/09/19 at  9:30 AM EDT by telephone and verified that I am speaking with the correct person using two identifiers.   I discussed the limitations, risks, security and privacy concerns of performing an evaluation and management service by telephone and the availability of in person appointments. I also discussed with the patient that there may be a patient responsible charge related to this service. The patient expressed understanding and agreed to proceed.  Patient location:  home My Location:  home office Persons on the call:  Me and the patient and interpreter  History of Present Illness: Patient c/o fungal infection on foot.  Getting bigger after applying triamcinilone.  Also c/o a fall and hitting her chest 05/18/2019.  Some CP and SOB that occurred 3 days ago.  None now.  Also c/o feeling more fatigued than normal.      Observations/Objective:  NAD.  A&Ox3   Assessment and Plan: 1. Gastroesophageal reflux disease, esophagitis presence not specified - omeprazole (PRILOSEC) 20 MG capsule; Take 1 capsule (20 mg total) by mouth daily.  Dispense: 30 capsule; Refill: 2  2. Seasonal allergies - loratadine (CLARITIN) 10 MG tablet; Take 1 tablet (10 mg total) by mouth daily as needed for allergies.  Dispense: 30 tablet; Refill: 2  3. Essential hypertension - lisinopril (ZESTRIL) 20 MG tablet; TAKE 1 TABLET (20 MG TOTAL) BY MOUTH DAILY. DOSE INCREASE  Dispense: 90 tablet; Refill: 2 - chlorthalidone (HYGROTON) 25 MG tablet; Take 0.5 tablets (12.5 mg total) by mouth daily. Stop hydrochlorothiazide.  Dispense: 90 tablet; Refill: 2 - carvedilol (COREG) 25 MG tablet; TAKE 1 TABLET BY MOUTH TWICE DAILY WITH FOOD  Dispense: 180 tablet; Refill: 2  4. Other specified hypothyroidism - levothyroxine (SYNTHROID) 25 MCG tablet; Take 1 tablet (25 mcg total) by mouth daily.  Dispense: 30 tablet; Refill: 2  5. Hyperlipidemia,  unspecified hyperlipidemia type - atorvastatin (LIPITOR) 20 MG tablet; TAKE 1 TABLET(20 MG) BY MOUTH DAILY  Dispense: 90 tablet; Refill: 0  6. Contusion of chest wall, unspecified laterality, initial encounter Go to ED or UC if it happens again  7. Tinea pedis, unspecified laterality - clotrimazole-betamethasone (LOTRISONE) cream; Apply 1 application topically 2 (two) times daily.  Dispense: 30 g; Refill: 0    Follow Up Instructions: See PCP 6 weeks   I discussed the assessment and treatment plan with the patient. The patient was provided an opportunity to ask questions and all were answered. The patient agreed with the plan and demonstrated an understanding of the instructions.   The patient was advised to call back or seek an in-person evaluation if the symptoms worsen or if the condition fails to improve as anticipated.  I provided 14 minutes of non-face-to-face time during this encounter.   Freeman Caldron, PA-C  Patient ID: Wendy Petty, female   DOB: 1940/10/07, 78 y.o.   MRN: [id number]

## 2019-06-11 ENCOUNTER — Telehealth: Payer: Self-pay | Admitting: Internal Medicine

## 2019-06-11 NOTE — Telephone Encounter (Signed)
Called patient and LVM to return call and schedule her 6 week f/u.

## 2019-06-11 NOTE — Telephone Encounter (Signed)
-----   Message from Debbra Riding, RMA sent at 06/09/2019 10:01 AM EDT ----- Regarding: PCP F/U 6 wks f/u with PCP

## 2019-06-14 ENCOUNTER — Other Ambulatory Visit: Payer: Self-pay | Admitting: Internal Medicine

## 2019-06-14 DIAGNOSIS — K219 Gastro-esophageal reflux disease without esophagitis: Secondary | ICD-10-CM

## 2019-06-25 NOTE — Progress Notes (Signed)
Cardiology Office Note   Date:  06/28/2019   ID:  Wendy Petty, DOB 1940/10/24, MRN [id number]  PCP:  [name], MD  Cardiologist:  [name]    No chief complaint on file.     History of Present Illness:  78 y.o. Wendy Petty female with HTN, HLD, Low thyroid Dementia and atypical chest pain.   Cath January 2013 with moderate disease Rx medically : Mid LAD 40, ostial D2 90 (small caliber), distal D3 20-30, distal RCA 40-50, mid PDA 30, EF 55-60%.  F/U myovue 07/14/13 normal EF 68%   Fell a few weeks ago and has been short of breath since then   Seen by PA 08/26/17 ACE increased for BP in addition to diuretic and norvasc stopped due to edema Activity limited by arthritis NSAI's upset stomach and uses omeprazole for this   No cough sputum fever. Does live independently with help from son and daughter   Past Medical History:  Diagnosis Date  . ANEMIA, CHRONIC   . 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 Breast mass, right 11/23/2015  . CHEST PAIN UNSPECIFIED   . Coronary atherosclerosis of native coronary artery 06/24/2013  . Depression 12/31/2012  . Diabetes mellitus without complication ([place]) [id number]  . Edema   . GERD (gastroesophageal reflux disease)   . History of TIAs 06/24/2013  . Hx of cardiovascular stress test    Lexiscan Myoview (11/14):  No ischemia, EF 68% (normal study)  . HYPERLIPIDEMIA   . Hypertension   . Hypothyroidism 09/29/2015  . Memory deficit 11/28/2014  . Primary osteoarthritis of both knees 09/29/2015  . PULMONARY NODULE   . THYROID NODULE   . Type 2 diabetes mellitus with microalbuminuria ([place]) 09/29/2015    Past Surgical History:  Procedure Laterality Date  . LEFT HEART CATHETERIZATION WITH CORONARY ANGIOGRAM N/A 09/16/2011   Procedure: LEFT HEART CATHETERIZATION WITH CORONARY ANGIOGRAM;  Surgeon: [name], MD;  Location: [facility] CATH LAB;  Service: Cardiovascular;  Laterality: N/A;  . TUBAL LIGATION    . VAGINAL  HYSTERECTOMY  1989     Current Outpatient Medications  Medication Sig Dispense Refill  . acetaminophen (TYLENOL 8 HOUR) 650 MG CR tablet Take 1 tablet (650 mg total) by mouth every 8 (eight) hours as needed for pain. 90 tablet 5  . atorvastatin (LIPITOR) 20 MG tablet TAKE 1 TABLET(20 MG) BY MOUTH DAILY 90 tablet 0  . carvedilol (COREG) 25 MG tablet TAKE 1 TABLET BY MOUTH TWICE DAILY WITH FOOD 180 tablet 2  . chlorthalidone (HYGROTON) 25 MG tablet Take 0.5 tablets (12.5 mg total) by mouth daily. Stop hydrochlorothiazide. 90 tablet 2  . clopidogrel (PLAVIX) 75 MG tablet Take 1 tablet (75 mg total) by mouth daily. 90 tablet 1  . clotrimazole-betamethasone (LOTRISONE) cream Apply 1 application topically 2 (two) times daily. 30 g 0  . diclofenac sodium (VOLTAREN) 1 % GEL Apply 2 g topically 4 (four) times daily. 100 g 2  . fluticasone (FLONASE) 50 MCG/ACT nasal spray SHAKE LIQUID AND USE 1 SPRAY IN EACH NOSTRIL DAILY AS NEEDED FOR ALLERGIES OR RHINITIS 16 g 2  . HYDROcodone-acetaminophen (NORCO/VICODIN) 5-325 MG tablet Take 1 tablet by mouth every 6 (six) hours as needed. 7 tablet 0  . levothyroxine (SYNTHROID) 25 MCG tablet Take 1 tablet (25 mcg total) by mouth daily. 30 tablet 2  . lisinopril (ZESTRIL) 20 MG tablet TAKE 1 TABLET (20 MG TOTAL) BY MOUTH DAILY. DOSE INCREASE 90 tablet 2  .  loratadine (CLARITIN) 10 MG tablet Take 1 tablet (10 mg total) by mouth daily as needed for allergies. 30 tablet 2  . memantine (NAMENDA) 10 MG tablet TAKE 1 TABLET BY MOUTH DAILY 90 tablet 0  . nitroGLYCERIN (NITROSTAT) 0.4 MG SL tablet Place 1 tablet (0.4 mg total) under the tongue every 5 (five) minutes as needed for chest pain. 15 tablet 2  . omeprazole (PRILOSEC) 20 MG capsule TAKE 1 CAPSULE(20 MG) BY MOUTH DAILY 30 capsule 2  . triamcinolone cream (KENALOG) 0.1 % Apply to affected area BID 30 g 0   No current facility-administered medications for this visit.     Allergies:   Hydrochlorothiazide     Social History:  The patient  reports that she has never smoked. She has never used smokeless tobacco. She reports that she does not drink alcohol or use drugs.   Family History:  The patient's family history includes Cancer in her father; Heart disease in her mother.    ROS:  General:no colds or fevers, no weight changes Skin:no rashes or ulcers HEENT:no blurred vision, no congestion CV:see HPI PUL:see HPI GI:no diarrhea constipation or melena, no indigestion GU:no hematuria, no dysuria MS:no joint pain, no claudication Neuro:no syncope, no lightheadedness Endo:no diabetes, + thyroid disease  Wt Readings from Last 3 Encounters:  06/28/19 145 lb (65.8 kg)  06/09/19 145 lb 15.1 oz (66.2 kg)  11/09/18 146 lb (66.2 kg)     PHYSICAL EXAM: BP (!) 146/72   Pulse 80   Ht 5' (1.524 m)   Wt 145 lb (65.8 kg)   SpO2 99%   BMI 28.32 kg/m  Affect appropriate Healthy:  appears stated age 43: normal Neck supple with no adenopathy JVP normal no bruits no thyromegaly Lungs clear with no wheezing and good diaphragmatic motion Heart:  S1/S2 no murmur, no rub, gallop or click PMI normal Abdomen: benighn, BS positve, no tenderness, no AAA no bruit.  No HSM or HJR Distal pulses intact with no bruits No edema Neuro non-focal Skin warm and dry No muscular weakness   EKG:   SR with freq PACs. No acute changes. 11/20/17    Recent Labs: 11/09/2018: TSH 1.590 03/17/2019: ALT 14 06/09/2019: BUN 27; Creatinine, Ser 1.28; Hemoglobin 12.8; Platelets 189; Potassium 4.3; Sodium 139    Lipid Panel    Component Value Date/Time   CHOL 145 06/18/2018 1210   TRIG 201 (H) 06/18/2018 1210   HDL 36 (L) 06/18/2018 1210   CHOLHDL 4.0 06/18/2018 1210   CHOLHDL 3.7 05/21/2016 1236   VLDL 43 (H) 05/21/2016 1236   LDLCALC 69 06/18/2018 1210       Other studies Reviewed: Additional studies/ records that were reviewed today include: .  Cardiac cath  Cath [facility] 09/16/11  Medical RX only small  diagonal disease  Final Conclusions:  1. Preserved LV function  2. Calcification of the coronaries, with scattered plaque as noted.  3. 90% small D2, in a vessel of 1-1.50mm  4. 50% mid RCA.  Recommendations:  1. Medical therapy.  Echo 12/06/15 Study Conclusions  - Left ventricle: The cavity size was normal. Wall thickness was   normal. Systolic function was normal. The estimated ejection   fraction was in the range of 60% to 65%. Wall motion was normal;   there were no regional wall motion abnormalities. - Aortic valve: Mildly calcified annulus. Mildly thickened   leaflets.  Nuc study 2017 Study Highlights    Nuclear stress EF: 47%.  This is a low  risk study.  The left ventricular ejection fraction is mildly decreased (45-54%).   Breast attenuation no ischemia or infarct EF 47% looks normal suggest echo correlation      ASSESSMENT AND PLAN:  1.  HTN improved with higher dose ACE did not take meds this am repeat BP by me 130 mmHg/80 mmHg  2. HLD continue statin patient declined zetia in past   3.  CAD non obstructive on cath in 2007, continue statin.   Neg nuc study in 2017 and echo stable.  4.  Dementia on namenda  5. DM-2 Discussed low carb diet.  Target hemoglobin A1c is 6.5 or less.  Continue current medications.  6. Dyspnea;  Since fall CXR no fractured ribs f/u echo    F/U with cardiology in a year  Jenkins Rouge

## 2019-06-28 ENCOUNTER — Ambulatory Visit (INDEPENDENT_AMBULATORY_CARE_PROVIDER_SITE_OTHER): Payer: Medicare Other | Admitting: Cardiovascular Disease

## 2019-06-28 ENCOUNTER — Encounter: Payer: Self-pay | Admitting: Cardiovascular Disease

## 2019-06-28 ENCOUNTER — Other Ambulatory Visit: Payer: Self-pay

## 2019-06-28 VITALS — BP 146/72 | HR 80 | Ht 60.0 in | Wt 145.0 lb

## 2019-06-28 DIAGNOSIS — R0600 Dyspnea, unspecified: Secondary | ICD-10-CM | POA: Diagnosis not present

## 2019-06-28 DIAGNOSIS — R0609 Other forms of dyspnea: Secondary | ICD-10-CM

## 2019-06-28 NOTE — Patient Instructions (Addendum)
Medication Instructions:   *If you need a refill on your cardiac medications before your next appointment, please call your pharmacy*  Lab Work:  If you have labs (blood work) drawn today and your tests are completely normal, you will receive your results only by: . MyChart Message (if you have MyChart) OR . A paper copy in the mail If you have any lab test that is abnormal or we need to change your treatment, we will call you to review the results.  Testing/Procedures: Your physician has requested that you have an echocardiogram. Echocardiography is a painless test that uses sound waves to create images of your heart. It provides your doctor with information about the size and shape of your heart and how well your heart's chambers and valves are working. This procedure takes approximately one hour. There are no restrictions for this procedure.  Follow-Up: At [facility], you and your health needs are our priority.  As part of our continuing mission to provide you with exceptional heart care, we have created designated Provider Care Teams.  These Care Teams include your primary Cardiologist (physician) and Advanced Practice Providers (APPs -  Physician Assistants and Nurse Practitioners) who all work together to provide you with the care you need, when you need it.  Your next appointment:   12 month(s)  The format for your next appointment:   In Person  Provider:   You may see Peter Nishan, MD or one of the following Advanced Practice Providers on your designated Care Team:    Lori Gerhardt, NP  Laura Ingold, NP  Jill McDaniel, NP   

## 2019-06-30 ENCOUNTER — Ambulatory Visit (HOSPITAL_COMMUNITY): Payer: Medicare Other | Attending: Cardiology

## 2019-06-30 ENCOUNTER — Other Ambulatory Visit: Payer: Self-pay

## 2019-06-30 DIAGNOSIS — R0600 Dyspnea, unspecified: Secondary | ICD-10-CM | POA: Diagnosis not present

## 2019-07-02 DIAGNOSIS — H25812 Combined forms of age-related cataract, left eye: Secondary | ICD-10-CM | POA: Diagnosis not present

## 2019-07-02 DIAGNOSIS — H2512 Age-related nuclear cataract, left eye: Secondary | ICD-10-CM | POA: Diagnosis not present

## 2019-07-07 ENCOUNTER — Telehealth: Payer: Self-pay | Admitting: Internal Medicine

## 2019-07-07 NOTE — Telephone Encounter (Signed)
Patient came in stating they would like to get a letter for legal reasons that says what her health issues are and what precautions she should be taking. Please follow up.

## 2019-07-08 NOTE — Telephone Encounter (Signed)
Will forward to pcp

## 2019-07-12 NOTE — Telephone Encounter (Signed)
Please contact pt and make aware that letter is ready for pickup

## 2019-07-13 ENCOUNTER — Ambulatory Visit: Payer: Medicare Other | Admitting: Orthopaedic Surgery

## 2019-07-14 ENCOUNTER — Other Ambulatory Visit: Payer: Self-pay

## 2019-07-14 DIAGNOSIS — Z20822 Contact with and (suspected) exposure to covid-19: Secondary | ICD-10-CM

## 2019-07-15 ENCOUNTER — Telehealth: Payer: Self-pay | Admitting: Internal Medicine

## 2019-07-15 ENCOUNTER — Ambulatory Visit: Payer: Self-pay

## 2019-07-15 ENCOUNTER — Encounter: Payer: Self-pay | Admitting: Orthopaedic Surgery

## 2019-07-15 ENCOUNTER — Encounter (HOSPITAL_COMMUNITY): Payer: Self-pay

## 2019-07-15 ENCOUNTER — Ambulatory Visit (INDEPENDENT_AMBULATORY_CARE_PROVIDER_SITE_OTHER): Payer: Medicare Other

## 2019-07-15 ENCOUNTER — Other Ambulatory Visit: Payer: Self-pay

## 2019-07-15 ENCOUNTER — Ambulatory Visit (INDEPENDENT_AMBULATORY_CARE_PROVIDER_SITE_OTHER): Payer: Medicare Other | Admitting: Orthopaedic Surgery

## 2019-07-15 ENCOUNTER — Ambulatory Visit (HOSPITAL_COMMUNITY)
Admission: EM | Admit: 2019-07-15 | Discharge: 2019-07-15 | Disposition: A | Discharge status: Home / Self Care | Payer: Medicare Other | Attending: Family Medicine | Admitting: Family Medicine

## 2019-07-15 DIAGNOSIS — H9313 Tinnitus, bilateral: Secondary | ICD-10-CM | POA: Diagnosis not present

## 2019-07-15 DIAGNOSIS — M545 Low back pain, unspecified: Secondary | ICD-10-CM | POA: Insufficient documentation

## 2019-07-15 DIAGNOSIS — M1711 Unilateral primary osteoarthritis, right knee: Secondary | ICD-10-CM | POA: Diagnosis not present

## 2019-07-15 DIAGNOSIS — M1712 Unilateral primary osteoarthritis, left knee: Secondary | ICD-10-CM

## 2019-07-15 LAB — NOVEL CORONAVIRUS, NAA: SARS-CoV-2, NAA: NOT DETECTED

## 2019-07-15 MED ORDER — LORATADINE 10 MG PO TABS: 10.0000 mg | ORAL_TABLET | Freq: Every day | ORAL | 0 refills | Status: DC

## 2019-07-15 MED ORDER — FLUTICASONE PROPIONATE 50 MCG/ACT NA SUSP: 1.0000 | Freq: Every day | NASAL | 0 refills | Status: DC

## 2019-07-15 MED ORDER — CETIRIZINE HCL 10 MG PO CAPS: 10.0000 mg | ORAL_CAPSULE | Freq: Every day | ORAL | 0 refills | Status: DC

## 2019-07-15 NOTE — ED Provider Notes (Signed)
[place]    CSN: [id number] Arrival date & time: 07/15/19  1102      History   Chief Complaint Chief Complaint  Patient presents with  . Otalgia    HPI Wendy Petty is a 78 y.o. female history of hypertension, hyperlipidemia, DM type II, CAD, presenting today for evaluation of ear discomfort.  Patient states that over the past 2 months she has had a ringing sensation in her left ear along with decreased hearing, she has discomfort that radiates into her left neck.  She has had some mild similar symptoms on the right side.  Feels as if there is "air" in ear.  Has history of similar in the past.  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  Also recommended following up with ear nose and throat.  She was seen at [facility] 3 years ago, unable to see notes from this visit.  She denies any injury or recent heavy lifting triggering pain into neck.  Denies recent URI symptoms- of cough or congestion. Does have sore throat in the morning.   HPI  Past Medical History:  Diagnosis Date  . ANEMIA, CHRONIC   . 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 Breast mass, right 11/23/2015  . CHEST PAIN UNSPECIFIED   . Coronary atherosclerosis of native coronary artery 06/24/2013  . Depression 12/31/2012  . Diabetes mellitus without complication ([place]) [id number]  . Edema   . GERD (gastroesophageal reflux disease)   . History of TIAs 06/24/2013  . Hx of cardiovascular stress test    Lexiscan Myoview (11/14):  No ischemia, EF 68% (normal study)  . HYPERLIPIDEMIA   . Hypertension   . Hypothyroidism 09/29/2015  . Memory deficit 11/28/2014  . Primary osteoarthritis of both knees 09/29/2015  . PULMONARY NODULE   . THYROID NODULE   . Type 2 diabetes mellitus with microalbuminuria ([place]) 09/29/2015    Patient Active Problem List   Diagnosis Date Noted  . Primary osteoarthritis of right knee 07/15/2019  . Low  back pain 07/15/2019  . Primary osteoarthritis of left knee 06/01/2019  . Seasonal allergies 11/09/2018  . Nocturnal muscle cramp 11/09/2018  . Breast mass, right 11/23/2015  . 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 Ear noise/buzzing 10/30/2015  . Chronic low back pain 10/30/2015  . Type 2 diabetes mellitus with microalbuminuria ([place]) 09/29/2015  . Poorly fitting dentures 09/29/2015  . Hypothyroidism 09/29/2015  . Primary osteoarthritis of both knees 09/29/2015  . Spondylolisthesis 07/19/2015  . Overweight (BMI 25.0-29.9) 06/29/2015  . Memory deficit 11/28/2014  . Coronary atherosclerosis of native coronary artery 06/24/2013  . History of TIAs 06/24/2013  . GERD (gastroesophageal reflux disease) 06/24/2013  . Depression 12/31/2012  . THYROID NODULE 02/14/2009  . Elevated lipids 02/14/2009  . ANEMIA, CHRONIC 02/14/2009  . Essential hypertension 02/14/2009  . PULMONARY NODULE 02/14/2009    Past Surgical History:  Procedure Laterality Date  . LEFT HEART CATHETERIZATION WITH CORONARY ANGIOGRAM N/A 09/16/2011   Procedure: LEFT HEART CATHETERIZATION WITH CORONARY ANGIOGRAM;  Surgeon: [name], MD;  Location: [facility] CATH LAB;  Service: Cardiovascular;  Laterality: N/A;  . TUBAL LIGATION    . VAGINAL HYSTERECTOMY  1989    OB History   No obstetric history on file.      Home Medications    Prior to Admission medications   Medication Sig Start Date End Date Taking? Authorizing Provider  acetaminophen (  TYLENOL 8 HOUR) 650 MG CR tablet Take 1 tablet (650 mg total) by mouth every 8 (eight) hours as needed for pain. 06/20/17   [name], MD  atorvastatin (LIPITOR) 20 MG tablet TAKE 1 TABLET(20 MG) BY MOUTH DAILY 06/09/19   Freeman Caldron M, PA-C  carvedilol (COREG) 25 MG tablet TAKE 1 TABLET BY MOUTH TWICE DAILY WITH FOOD 06/09/19   Freeman Caldron M, PA-C  chlorthalidone (HYGROTON) 25 MG tablet Take 0.5 tablets (12.5 mg total) by mouth daily. Stop  hydrochlorothiazide. 06/09/19   Argentina Donovan, PA-C  clopidogrel (PLAVIX) 75 MG tablet Take 1 tablet (75 mg total) by mouth daily. 06/09/19   Argentina Donovan, PA-C  clotrimazole-betamethasone (LOTRISONE) cream Apply 1 application topically 2 (two) times daily. 06/09/19   Argentina Donovan, PA-C  diclofenac sodium (VOLTAREN) 1 % GEL Apply 2 g topically 4 (four) times daily. 06/09/19   Argentina Donovan, PA-C  fluticasone (FLONASE) 50 MCG/ACT nasal spray Place 1-2 sprays into both nostrils daily for 7 days. 07/15/19 07/22/19  Roxene Alviar C, PA-C  HYDROcodone-acetaminophen (NORCO/VICODIN) 5-325 MG tablet Take 1 tablet by mouth every 6 (six) hours as needed. 06/09/19   Montine Circle, PA-C  levothyroxine (SYNTHROID) 25 MCG tablet Take 1 tablet (25 mcg total) by mouth daily. 06/09/19   Argentina Donovan, PA-C  lisinopril (ZESTRIL) 20 MG tablet TAKE 1 TABLET (20 MG TOTAL) BY MOUTH DAILY. DOSE INCREASE 06/09/19   Freeman Caldron M, PA-C  loratadine (CLARITIN) 10 MG tablet Take 1 tablet (10 mg total) by mouth daily. 07/15/19   Naquan Garman C, PA-C  memantine (NAMENDA) 10 MG tablet TAKE 1 TABLET BY MOUTH DAILY 04/19/19   [name], MD  nitroGLYCERIN (NITROSTAT) 0.4 MG SL tablet Place 1 tablet (0.4 mg total) under the tongue every 5 (five) minutes as needed for chest pain. 06/20/17   [name], MD  omeprazole (PRILOSEC) 20 MG capsule TAKE 1 CAPSULE(20 MG) BY MOUTH DAILY 06/14/19   Freeman Caldron M, PA-C  triamcinolone cream (KENALOG) 0.1 % Apply to affected area BID 04/30/19   [name], MD  Cetirizine HCl 10 MG CAPS Take 1 capsule (10 mg total) by mouth daily for 15 days. 07/15/19 07/15/19  Violet Cart, Elesa Hacker, PA-C    Family History Family History  Problem Relation Age of Onset  . Heart disease Mother   . Cancer Father        a tumor ruptured in his abd.    Social History Social History   Tobacco Use  . Smoking status: Never Smoker  . Smokeless tobacco: Never Used   Substance Use Topics  . Alcohol use: No    Alcohol/week: 0.0 standard drinks  . Drug use: No     Allergies   Hydrochlorothiazide   Review of Systems Review of Systems  Constitutional: Negative for activity change, appetite change, chills, fatigue and fever.  HENT: Positive for hearing loss, sore throat and tinnitus. Negative for congestion, ear pain, rhinorrhea, sinus pressure and trouble swallowing.   Eyes: Negative for discharge and redness.  Respiratory: Negative for cough, chest tightness and shortness of breath.   Cardiovascular: Negative for chest pain.  Gastrointestinal: Negative for abdominal pain, diarrhea, nausea and vomiting.  Musculoskeletal: Positive for neck pain. Negative for myalgias.  Skin: Negative for rash.  Neurological: Negative for dizziness, light-headedness and headaches.     Physical Exam Triage Vital Signs ED Triage Vitals  Enc Vitals Group     BP 07/15/19 1129  134/62     Pulse Rate 07/15/19 1129 83     Resp 07/15/19 1129 18     Temp 07/15/19 1129 98.3 F (36.8 C)     Temp src --      SpO2 07/15/19 1129 100 %     Weight 07/15/19 1132 130 lb (59 kg)     Height --      Head Circumference --      Peak Flow --      Pain Score 07/15/19 1132 4     Pain Loc --      Pain Edu? --      Excl. in [place]? --    No data found.  Updated Vital Signs BP 134/62 (BP Location: Right Arm)   Pulse 83   Temp 98.3 F (36.8 C)   Resp 18   Wt 130 lb (59 kg)   SpO2 100%   BMI 25.39 kg/m   Visual Acuity Right Eye Distance:   Left Eye Distance:   Bilateral Distance:    Right Eye Near:   Left Eye Near:    Bilateral Near:     Physical Exam Vitals signs and nursing note reviewed.  Constitutional:      General: She is not in acute distress.    Appearance: She is well-developed.  HENT:     Head: Normocephalic and atraumatic.     Ears:     Comments: Bilateral ears without tenderness to palpation of external auricle, tragus and mastoid, EAC's without  erythema or swelling, TM's with good bony landmarks and cone of light. Non erythematous.     Nose:     Comments: nasal mucosa pink, nonswollen turbinates, no rhinorrhea    Mouth/Throat:     Comments: Oral mucosa pink and moist, no tonsillar enlargement or exudate. Posterior pharynx patent and nonerythematous, no uvula deviation or swelling. Normal phonation. Eyes:     Conjunctiva/sclera: Conjunctivae normal.  Neck:     Musculoskeletal: Neck supple.     Comments: Full active range of motion of neck, no palpable lymphadenopathy Cardiovascular:     Rate and Rhythm: Normal rate and regular rhythm.     Heart sounds: No murmur.  Pulmonary:     Effort: Pulmonary effort is normal. No respiratory distress.     Breath sounds: Normal breath sounds.  Abdominal:     Palpations: Abdomen is soft.     Tenderness: There is no abdominal tenderness.  Musculoskeletal:     Comments: Nontender to palpation of cervical spine midline, tenderness to palpation along left cervical musculature  Skin:    General: Skin is warm and dry.  Neurological:     Mental Status: She is alert.      UC Treatments / Results  Labs (all labs ordered are listed, but only abnormal results are displayed) Labs Reviewed - No data to display  EKG   Radiology Xr Knee 3 View Left  Result Date: 07/15/2019 Advanced tricompartmental DJD.  Xr Knee 3 View Right  Result Date: 07/15/2019 Advanced tricompartmental DJD  Xr Lumbar Spine 2-3 Views  Result Date: 07/15/2019 Lumbar spondylosis and multilevel degenerative disc disease   Procedures Procedures (including critical care time)  Medications Ordered in UC Medications - No data to display  Initial Impression / Assessment and Plan / UC Course  I have reviewed the triage vital signs and the nursing notes.  Pertinent labs & imaging results that were available during my care of the patient were reviewed by me and considered in  my medical decision making (see chart for  details).     Ear exam normal, unclear cause of discomfort and drinking.  Will treat for eustachian tube dysfunction with Claritin and Flonase.  Will have follow-up with ENT for further evaluation of symptoms.Discussed strict return precautions. Patient verbalized understanding and is agreeable with plan.  Final Clinical Impressions(s) / UC Diagnoses   Final diagnoses:  Tinnitus of both ears     Discharge Instructions     Begin daily claritin Flonase nasal spray 1-2 in each nostril  Follow up with ENT    ED Prescriptions    Medication Sig Dispense Auth. Provider   fluticasone (FLONASE) 50 MCG/ACT nasal spray Place 1-2 sprays into both nostrils daily for 7 days. 1 g Rosendo Couser C, PA-C   Cetirizine HCl 10 MG CAPS  (Status: Discontinued) Take 1 capsule (10 mg total) by mouth daily for 15 days. 15 capsule Chasen Mendell C, PA-C   loratadine (CLARITIN) 10 MG tablet Take 1 tablet (10 mg total) by mouth daily. 20 tablet Cabe Lashley, [place] C, PA-C     PDMP not reviewed this encounter.   Janith Lima, PA-C 07/15/19 1246

## 2019-07-15 NOTE — Addendum Note (Signed)
Addended by: Precious Bard on: 07/15/2019 11:30 AM   Modules accepted: Orders

## 2019-07-15 NOTE — Progress Notes (Signed)
Office Visit Note   Patient: Wendy Petty           Date of Birth: 1941/02/15           MRN: [id number] Visit Date: 07/15/2019              Requested by: [name], MD [address] [place],  [place] [address] PCP: [name], MD   Assessment & Plan: Visit Diagnoses:  1. Primary osteoarthritis of right knee   2. Primary osteoarthritis of left knee   3. Low back pain, unspecified back pain laterality, unspecified chronicity, unspecified whether sciatica present     Plan: My impression is chronic low back pain and degenerative disc disease.  We will obtain MRI to rule out structural abnormalities.  In terms of her knees based on discussion today with the help of the interpreter she has elected to proceed with right total knee replacement.  Risks and benefits and rehab were reviewed.  We will obtain preoperative medical and cardiac assessment.  Patient takes Plavix which will need to be stopped a week in advance of surgery.  We look forward to treating her in the operating room in the near future.  Follow-Up Instructions: Return if symptoms worsen or fail to improve.   Orders:  Orders Placed This Encounter  Procedures  . XR KNEE 3 VIEW LEFT  . XR Lumbar Spine 2-3 Views  . XR KNEE 3 VIEW RIGHT   No orders of the defined types were placed in this encounter.     Procedures: No procedures performed   Clinical Data: No additional findings.   Subjective: Chief Complaint  Patient presents with  . Right Knee - Pain    Patient comes back today with her daughter for evaluation of low back pain and bilateral knee pain.  She has had chronic low back pain that has been getting worse over the last 10 years.  She did to physical therapy in the past which helped temporarily.  Her knees continue to cause her severe pain.  Denies any numbness and tingling.  Denies any constitutional symptoms or bowel bladder dysfunction.   Review of Systems  Constitutional:  Negative.   HENT: Negative.   Eyes: Negative.   Respiratory: Negative.   Cardiovascular: Negative.   Endocrine: Negative.   Musculoskeletal: Negative.   Neurological: Negative.   Hematological: Negative.   Psychiatric/Behavioral: Negative.   All other systems reviewed and are negative.    Objective: Vital Signs: There were no vitals taken for this visit.  Physical Exam Vitals signs and nursing note reviewed.  Constitutional:      Appearance: She is well-developed.  Pulmonary:     Effort: Pulmonary effort is normal.  Skin:    General: Skin is warm.     Capillary Refill: Capillary refill takes less than 2 seconds.  Neurological:     Mental Status: She is alert and oriented to person, place, and time.  Psychiatric:        Behavior: Behavior normal.        Thought Content: Thought content normal.        Judgment: Judgment normal.     Ortho Exam Low back exam shows tenderness along the lumbar spine.  No focal motor or sensory deficits.  Bilateral knee exam shows trace joint effusions.  No range of motion.  Varus alignment.  2+ patellofemoral crepitus. Specialty Comments:  No specialty comments available.  Imaging: Xr Knee 3 View Left  Result Date:  07/15/2019 Advanced tricompartmental DJD.  Xr Knee 3 View Right  Result Date: 07/15/2019 Advanced tricompartmental DJD  Xr Lumbar Spine 2-3 Views  Result Date: 07/15/2019 Lumbar spondylosis and multilevel degenerative disc disease    PMFS History: Patient Active Problem List   Diagnosis Date Noted  . Primary osteoarthritis of right knee 07/15/2019  . Low back pain 07/15/2019  . Primary osteoarthritis of left knee 06/01/2019  . Seasonal allergies 11/09/2018  . Nocturnal muscle cramp 11/09/2018  . Breast mass, right 11/23/2015  . 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 Ear noise/buzzing 10/30/2015  . Chronic low back pain 10/30/2015  . Type 2 diabetes mellitus with microalbuminuria ([place]) 09/29/2015   . Poorly fitting dentures 09/29/2015  . Hypothyroidism 09/29/2015  . Primary osteoarthritis of both knees 09/29/2015  . Spondylolisthesis 07/19/2015  . Overweight (BMI 25.0-29.9) 06/29/2015  . Memory deficit 11/28/2014  . Coronary atherosclerosis of native coronary artery 06/24/2013  . History of TIAs 06/24/2013  . GERD (gastroesophageal reflux disease) 06/24/2013  . Depression 12/31/2012  . THYROID NODULE 02/14/2009  . Elevated lipids 02/14/2009  . ANEMIA, CHRONIC 02/14/2009  . Essential hypertension 02/14/2009  . PULMONARY NODULE 02/14/2009   Past Medical History:  Diagnosis Date  . ANEMIA, CHRONIC   . 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 Breast mass, right 11/23/2015  . CHEST PAIN UNSPECIFIED   . Coronary atherosclerosis of native coronary artery 06/24/2013  . Depression 12/31/2012  . Diabetes mellitus without complication ([place]) [id number]  . Edema   . GERD (gastroesophageal reflux disease)   . History of TIAs 06/24/2013  . Hx of cardiovascular stress test    Lexiscan Myoview (11/14):  No ischemia, EF 68% (normal study)  . HYPERLIPIDEMIA   . Hypertension   . Hypothyroidism 09/29/2015  . Memory deficit 11/28/2014  . Primary osteoarthritis of both knees 09/29/2015  . PULMONARY NODULE   . THYROID NODULE   . Type 2 diabetes mellitus with microalbuminuria ([place]) 09/29/2015    Family History  Problem Relation Age of Onset  . Heart disease Mother   . Cancer Father        a tumor ruptured in his abd.    Past Surgical History:  Procedure Laterality Date  . LEFT HEART CATHETERIZATION WITH CORONARY ANGIOGRAM N/A 09/16/2011   Procedure: LEFT HEART CATHETERIZATION WITH CORONARY ANGIOGRAM;  Surgeon: [name], MD;  Location: [facility] CATH LAB;  Service: Cardiovascular;  Laterality: N/A;  . TUBAL LIGATION    . VAGINAL HYSTERECTOMY  1989   Social History   Occupational History  . Not on file  Tobacco Use  . Smoking status: Never Smoker  . Smokeless tobacco: Never Used   Substance and Sexual Activity  . Alcohol use: No    Alcohol/week: 0.0 standard drinks  . Drug use: No  . Sexual activity: Never

## 2019-07-15 NOTE — ED Triage Notes (Signed)
Pt states she has ear discomfort. X 1 month or more. Pt states she has a ringing in her ears.

## 2019-07-15 NOTE — Telephone Encounter (Signed)
Patient came in stating she was told by the ED to follow up with PCP for a ENT referral. Please follow up.

## 2019-07-15 NOTE — Telephone Encounter (Signed)
-----   Message from Janith Lima, PA-C sent at 07/15/2019 12:46 PM EST ----- Regarding: Follow up/Referarl for ENT Hi Dr. Wynetta Emery,  I have you listed as the PCP for this patient. I saw this patient of yours today for 2 months of ringing and left neck pain. Ear exam normal. Has history of similar. Previously saw audiology in 2017 for sensorineural loss on left side and has previously been to [facility] ENT but is over 3 years ago. I didn't know if your office would be able to put a new referral in for this patient to re-follow up with ENT.   Thanks, [employer]

## 2019-07-15 NOTE — Discharge Instructions (Signed)
Begin daily claritin Flonase nasal spray 1-2 in each nostril  Follow up with ENT

## 2019-07-16 ENCOUNTER — Telehealth: Payer: Self-pay | Admitting: *Deleted

## 2019-07-16 NOTE — Telephone Encounter (Signed)
   Primary Cardiologist: Jenkins Rouge, MD  Chart reviewed as part of pre-operative protocol coverage. Given past medical history and time since last visit, based on ACC/AHA guidelines, Wendy Petty would be at acceptable risk for the planned procedure without further cardiovascular testing.   Per [name], patient may hold Plavix 7 days prior to procedure then resume once stable from a surgical standpoint.  Patient was recently seen in follow-up on 06/28/2019 and was doing well from a cardiac perspective.  I will route this recommendation to the requesting party via Epic fax function and remove from pre-op pool.  Please call with questions.  Kathyrn Drown, NP 07/16/2019, 12:09 PM

## 2019-07-16 NOTE — Telephone Encounter (Signed)
   [facility] Pre-operative Risk Assessment    Request for surgical clearance:  1. What type of surgery is being performed? RIGHT TOTAL KNEE ARTHROPLASTY   2. When is this surgery scheduled? TBD   3. What type of clearance is required (medical clearance vs. Pharmacy clearance to hold med vs. Both)? MEDICAL  4. Are there any medications that need to be held prior to surgery and how long? PLAVIX X 7 DAYS PRIOR   5. Practice name and name of physician performing surgery?  [facility] [facility]; [place]  6. What is your office phone number  [phone]   7.   What is your office fax number [phone]  8.   Anesthesia type (None, local, MAC, general) ? SPINAL & BLOCK   Julaine Hua 07/16/2019, 10:31 AM  _________________________________________________________________   (provider comments below)

## 2019-07-16 NOTE — Telephone Encounter (Signed)
[name]  Can you please comment on holding this patient's Plavix therapy for upcoming right total knee arthroplasty?  They are requesting this to be held for 7 days prior to procedure.  You recently saw her 06/28/2019 in follow-up and well from a cardiac perspective.  Has a history of moderate CAD per cardiac cath 2013 with Mid LAD 40, ostial D2 90 (small caliber), distal D3 20-30, distal RCA 40-50, mid PDA 30, EF 55-60%.  Also with a history of hypertension, HLD and dementia.  Please send her recommendations to the preop pool  Thank you Sharee Pimple

## 2019-07-16 NOTE — Telephone Encounter (Signed)
Ok to hold plavix 

## 2019-07-19 ENCOUNTER — Telehealth: Payer: Self-pay

## 2019-07-19 DIAGNOSIS — I10 Essential (primary) hypertension: Secondary | ICD-10-CM

## 2019-07-19 DIAGNOSIS — I2510 Atherosclerotic heart disease of native coronary artery without angina pectoris: Secondary | ICD-10-CM

## 2019-07-19 NOTE — Telephone Encounter (Signed)
-----   Message from Josue Hector, MD sent at 07/01/2019  7:57 AM EDT ----- Low normal EF no bad valves f/u echo in 6 months

## 2019-07-19 NOTE — Telephone Encounter (Signed)
Patient aware of results. Per [name], Low normal EF no bad valves f/u echo in 6 months. Patient verbalized understanding. Ordered echo for 6 months.

## 2019-07-30 ENCOUNTER — Encounter: Payer: Self-pay | Admitting: Internal Medicine

## 2019-07-30 ENCOUNTER — Other Ambulatory Visit: Payer: Self-pay

## 2019-07-30 ENCOUNTER — Ambulatory Visit: Payer: Medicare Other | Attending: Internal Medicine | Admitting: Internal Medicine

## 2019-07-30 VITALS — BP 164/82 | HR 79 | Temp 98.1°F | Resp 16 | Wt 148.4 lb

## 2019-07-30 DIAGNOSIS — E039 Hypothyroidism, unspecified: Secondary | ICD-10-CM | POA: Diagnosis not present

## 2019-07-30 DIAGNOSIS — Z8673 Personal history of transient ischemic attack (TIA), and cerebral infarction without residual deficits: Secondary | ICD-10-CM | POA: Insufficient documentation

## 2019-07-30 DIAGNOSIS — Z7902 Long term (current) use of antithrombotics/antiplatelets: Secondary | ICD-10-CM | POA: Diagnosis not present

## 2019-07-30 DIAGNOSIS — Z79899 Other long term (current) drug therapy: Secondary | ICD-10-CM | POA: Insufficient documentation

## 2019-07-30 DIAGNOSIS — M159 Polyosteoarthritis, unspecified: Secondary | ICD-10-CM

## 2019-07-30 DIAGNOSIS — H9312 Tinnitus, left ear: Secondary | ICD-10-CM

## 2019-07-30 DIAGNOSIS — H9192 Unspecified hearing loss, left ear: Secondary | ICD-10-CM | POA: Diagnosis not present

## 2019-07-30 DIAGNOSIS — G8929 Other chronic pain: Secondary | ICD-10-CM | POA: Insufficient documentation

## 2019-07-30 DIAGNOSIS — Z79890 Hormone replacement therapy (postmenopausal): Secondary | ICD-10-CM | POA: Diagnosis not present

## 2019-07-30 DIAGNOSIS — E1122 Type 2 diabetes mellitus with diabetic chronic kidney disease: Secondary | ICD-10-CM | POA: Diagnosis not present

## 2019-07-30 DIAGNOSIS — K219 Gastro-esophageal reflux disease without esophagitis: Secondary | ICD-10-CM | POA: Diagnosis not present

## 2019-07-30 DIAGNOSIS — Z882 Allergy status to sulfonamides status: Secondary | ICD-10-CM | POA: Diagnosis not present

## 2019-07-30 DIAGNOSIS — M160 Bilateral primary osteoarthritis of hip: Secondary | ICD-10-CM | POA: Diagnosis not present

## 2019-07-30 DIAGNOSIS — Z800 Family history of malignant neoplasm of digestive organs: Secondary | ICD-10-CM | POA: Insufficient documentation

## 2019-07-30 DIAGNOSIS — I2510 Atherosclerotic heart disease of native coronary artery without angina pectoris: Secondary | ICD-10-CM | POA: Insufficient documentation

## 2019-07-30 DIAGNOSIS — I10 Essential (primary) hypertension: Secondary | ICD-10-CM | POA: Diagnosis not present

## 2019-07-30 DIAGNOSIS — E118 Type 2 diabetes mellitus with unspecified complications: Secondary | ICD-10-CM | POA: Diagnosis not present

## 2019-07-30 DIAGNOSIS — Z888 Allergy status to other drugs, medicaments and biological substances status: Secondary | ICD-10-CM | POA: Insufficient documentation

## 2019-07-30 DIAGNOSIS — M8949 Other hypertrophic osteoarthropathy, multiple sites: Secondary | ICD-10-CM

## 2019-07-30 DIAGNOSIS — Z791 Long term (current) use of non-steroidal anti-inflammatories (NSAID): Secondary | ICD-10-CM | POA: Diagnosis not present

## 2019-07-30 DIAGNOSIS — Z8249 Family history of ischemic heart disease and other diseases of the circulatory system: Secondary | ICD-10-CM | POA: Insufficient documentation

## 2019-07-30 DIAGNOSIS — M170 Bilateral primary osteoarthritis of knee: Secondary | ICD-10-CM | POA: Insufficient documentation

## 2019-07-30 MED ORDER — CHLORTHALIDONE 25 MG PO TABS: 12.5000 mg | ORAL_TABLET | Freq: Every day | ORAL | 2 refills | Status: DC

## 2019-07-30 NOTE — Progress Notes (Signed)
Patient ID: Wendy Petty, female    DOB: 08/05/1941  MRN: [id number]  CC: Medical Clearance   Subjective: Wendy Petty is a 78 y.o. female who presents for pre-op clearance Her concerns today include:  Hx ofHTN, DM,CKD 3,hypothyroid, GERD, coronary atherosclerosis, HL,lacunar CVA,OA back, hip and knees.  I received a form from Dr. Erlinda Hong for preoperative evaluation for right knee replacement.  Pt not wanting surgery on knee she states it is her hips.  Had inj BL knees which did not help.   "Knees loose and a little weak but most pain in my hips."  She is known osteoarthritis in the knees and hips. Using Diclofenac cream which helps  HTN:  Took meds already today.  Had surgery on RT eye 1 mth ago.  Told by surgeon to stop the chlorthalidone prior to surgery so that she would not have to get up to urinate during the procedure.  She is wondering whether to restart the medication. No device to check BP NO SOB/chest pain  DM:  Not on any med.  Diet control.  Not checking BS  Decreasze hearing and humming in LT ear x 1 mth  Patient Active Problem List   Diagnosis Date Noted  . Primary osteoarthritis of right knee 07/15/2019  . Low back pain 07/15/2019  . Primary osteoarthritis of left knee 06/01/2019  . Seasonal allergies 11/09/2018  . Nocturnal muscle cramp 11/09/2018  . Breast mass, right 11/23/2015  . 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 Ear noise/buzzing 10/30/2015  . Chronic low back pain 10/30/2015  . Type 2 diabetes mellitus with microalbuminuria ([place]) 09/29/2015  . Poorly fitting dentures 09/29/2015  . Hypothyroidism 09/29/2015  . Primary osteoarthritis of both knees 09/29/2015  . Spondylolisthesis 07/19/2015  . Overweight (BMI 25.0-29.9) 06/29/2015  . Memory deficit 11/28/2014  . Coronary atherosclerosis of native coronary artery 06/24/2013  . History of TIAs 06/24/2013  . GERD (gastroesophageal reflux disease) 06/24/2013  .  Depression 12/31/2012  . THYROID NODULE 02/14/2009  . Elevated lipids 02/14/2009  . ANEMIA, CHRONIC 02/14/2009  . Essential hypertension 02/14/2009  . PULMONARY NODULE 02/14/2009     Current Outpatient Medications on File Prior to Visit  Medication Sig Dispense Refill  . acetaminophen (TYLENOL 8 HOUR) 650 MG CR tablet Take 1 tablet (650 mg total) by mouth every 8 (eight) hours as needed for pain. 90 tablet 5  . atorvastatin (LIPITOR) 20 MG tablet TAKE 1 TABLET(20 MG) BY MOUTH DAILY 90 tablet 0  . carvedilol (COREG) 25 MG tablet TAKE 1 TABLET BY MOUTH TWICE DAILY WITH FOOD 180 tablet 2  . chlorthalidone (HYGROTON) 25 MG tablet Take 0.5 tablets (12.5 mg total) by mouth daily. Stop hydrochlorothiazide. 90 tablet 2  . clopidogrel (PLAVIX) 75 MG tablet Take 1 tablet (75 mg total) by mouth daily. 90 tablet 1  . clotrimazole-betamethasone (LOTRISONE) cream Apply 1 application topically 2 (two) times daily. (Patient not taking: Reported on 07/30/2019) 30 g 0  . diclofenac sodium (VOLTAREN) 1 % GEL Apply 2 g topically 4 (four) times daily. 100 g 2  . fluticasone (FLONASE) 50 MCG/ACT nasal spray Place 1-2 sprays into both nostrils daily for 7 days. 1 g 0  . levothyroxine (SYNTHROID) 25 MCG tablet Take 1 tablet (25 mcg total) by mouth daily. 30 tablet 2  . lisinopril (ZESTRIL) 20 MG tablet TAKE 1 TABLET (20 MG TOTAL) BY MOUTH DAILY. DOSE INCREASE 90 tablet 2  . loratadine (CLARITIN) 10 MG tablet Take  1 tablet (10 mg total) by mouth daily. 20 tablet 0  . memantine (NAMENDA) 10 MG tablet TAKE 1 TABLET BY MOUTH DAILY 90 tablet 0  . nitroGLYCERIN (NITROSTAT) 0.4 MG SL tablet Place 1 tablet (0.4 mg total) under the tongue every 5 (five) minutes as needed for chest pain. 15 tablet 2  . omeprazole (PRILOSEC) 20 MG capsule TAKE 1 CAPSULE(20 MG) BY MOUTH DAILY 30 capsule 2  . triamcinolone cream (KENALOG) 0.1 % Apply to affected area BID 30 g 0  . [DISCONTINUED] Cetirizine HCl 10 MG CAPS Take 1 capsule (10 mg  total) by mouth daily for 15 days. 15 capsule 0   No current facility-administered medications on file prior to visit.     Allergies  Allergen Reactions  . Hydrochlorothiazide     Causes numbness on the sole of her feet.    Social History   Socioeconomic History  . Marital status: Married    Spouse name: Not on file  . Number of children: Not on file  . Years of education: Not on file  . Highest education level: Not on file  Occupational History  . Not on file  Social Needs  . Financial resource strain: Not on file  . Food insecurity    Worry: Not on file    Inability: Not on file  . Transportation needs    Medical: Not on file    Non-medical: Not on file  Tobacco Use  . Smoking status: Never Smoker  . Smokeless tobacco: Never Used  Substance and Sexual Activity  . Alcohol use: No    Alcohol/week: 0.0 standard drinks  . Drug use: No  . Sexual activity: Never  Lifestyle  . Physical activity    Days per week: Not on file    Minutes per session: Not on file  . Stress: Not on file  Relationships  . Social Herbalist on phone: Not on file    Gets together: Not on file    Attends religious service: Not on file    Active member of club or organization: Not on file    Attends meetings of clubs or organizations: Not on file    Relationship status: Not on file  . Intimate partner violence    Fear of current or ex partner: Not on file    Emotionally abused: Not on file    Physically abused: Not on file    Forced sexual activity: Not on file  Other Topics Concern  . Not on file  Social History Narrative   Lives with daughter in a 2 story home.  Does not work.     Family History  Problem Relation Age of Onset  . Heart disease Mother   . Cancer Father        a tumor ruptured in his abd.    Past Surgical History:  Procedure Laterality Date  . LEFT HEART CATHETERIZATION WITH CORONARY ANGIOGRAM N/A 09/16/2011   Procedure: LEFT HEART CATHETERIZATION WITH  CORONARY ANGIOGRAM;  Surgeon: [name], MD;  Location: [facility] CATH LAB;  Service: Cardiovascular;  Laterality: N/A;  . TUBAL LIGATION    . VAGINAL HYSTERECTOMY  1989    ROS: Review of Systems Negative except as stated above  PHYSICAL EXAM: BP (!) 164/82   Pulse 79   Temp 98.1 F (36.7 C) (Oral)   Resp 16   Wt 148 lb 6.4 oz (67.3 kg)   SpO2 97%   BMI 28.98 kg/m  Physical Exam  General appearance -patient is a very difficult historian.  She continues to talk without allowing the interpreter sufficient time to interpret.  She also has problems answering questions directly.  I think this is related to dementia.   Neck - supple, no significant adenopathy Ears: Both ear canal and membranes are within normal limits. Chest - clear to auscultation, no wheezes, rales or rhonchi, symmetric air entry Heart - normal rate, regular rhythm, normal S1, S2, no murmurs, rubs, clicks or gallops Musculoskeletal -moderate enlargement of both knee joints.  She ambulates without assistive device. Extremities -no lower extremity edema  CMP Latest Ref Rng & Units 06/09/2019 03/17/2019 11/09/2018  Glucose 70 - 99 mg/dL 144(H) 108(H) 93  BUN 8 - 23 mg/dL 27(H) 29(H) 29(H)  Creatinine 0.44 - 1.00 mg/dL 1.28(H) 1.16(H) 1.11(H)  Sodium 135 - 145 mmol/L 139 140 141  Potassium 3.5 - 5.1 mmol/L 4.3 3.9 4.2  Chloride 98 - 111 mmol/L 104 102 104  CO2 22 - 32 mmol/L 23 23 24   Calcium 8.9 - 10.3 mg/dL 8.8(L) 9.2 9.1  Total Protein 6.0 - 8.5 g/dL - 7.1 -  Total Bilirubin 0.0 - 1.2 mg/dL - 0.3 -  Alkaline Phos 39 - 117 IU/L - 80 -  AST 0 - 40 IU/L - 18 -  ALT 0 - 32 IU/L - 14 -   Lipid Panel     Component Value Date/Time   CHOL 145 06/18/2018 1210   TRIG 201 (H) 06/18/2018 1210   HDL 36 (L) 06/18/2018 1210   CHOLHDL 4.0 06/18/2018 1210   CHOLHDL 3.7 05/21/2016 1236   VLDL 43 (H) 05/21/2016 1236   LDLCALC 69 06/18/2018 1210    CBC    Component Value Date/Time   WBC 7.5 06/09/2019 1707   RBC 4.24  06/09/2019 1707   HGB 12.8 06/09/2019 1707   HGB 12.2 06/18/2018 1210   HCT 39.3 06/09/2019 1707   HCT 37.6 06/18/2018 1210   PLT 189 06/09/2019 1707   PLT 238 06/18/2018 1210   MCV 92.7 06/09/2019 1707   MCV 90 06/18/2018 1210   MCH 30.2 06/09/2019 1707   MCHC 32.6 06/09/2019 1707   RDW 12.4 06/09/2019 1707   RDW 12.0 (L) 06/18/2018 1210   LYMPHSABS 1.9 06/18/2018 1210   MONOABS 0.5 06/28/2013 1820   EOSABS 0.3 06/18/2018 1210   BASOSABS 0.0 06/18/2018 1210    ASSESSMENT AND PLAN: 1. Primary osteoarthritis involving multiple joints Patient does not wish to proceed with having knee replacement surgery.  She states she will go back to the orthopedic specialist to clarify why she is having surgery on her knee and not on her hip. I asked the patient whether she felt she needed stronger pain medication.  However she tells me that she is already on a lot of medications so I took that as a no. - Ambulatory referral to Orthopedic Surgery  2. Essential hypertension Not at goal.  She will restart chlorthalidone  3. Controlled type 2 diabetes mellitus with complication, without long-term current use of insulin (HCC) Control with diet.  4. Decreased hearing of left ear - Ambulatory referral to ENT  5. Tinnitus, left ear - Ambulatory referral to ENT     Patient was given the opportunity to ask questions.  Patient verbalized understanding of the plan and was able to repeat key elements of the plan.  [employer] interpreter used during this encounter. [id number]   Orders Placed This Encounter  Procedures  . Ambulatory referral  to ENT  . Ambulatory referral to Orthopedic Surgery     Requested Prescriptions    No prescriptions requested or ordered in this encounter    Return in about 4 months (around 11/27/2019).  Karle Plumber, MD, FACP

## 2019-08-01 ENCOUNTER — Other Ambulatory Visit: Payer: Self-pay | Admitting: Internal Medicine

## 2019-08-01 DIAGNOSIS — R413 Other amnesia: Secondary | ICD-10-CM

## 2019-08-02 DIAGNOSIS — H2511 Age-related nuclear cataract, right eye: Secondary | ICD-10-CM | POA: Diagnosis not present

## 2019-08-02 DIAGNOSIS — H25811 Combined forms of age-related cataract, right eye: Secondary | ICD-10-CM | POA: Diagnosis not present

## 2019-08-10 ENCOUNTER — Ambulatory Visit
Admission: RE | Admit: 2019-08-10 | Discharge: 2019-08-10 | Disposition: A | Discharge status: Home / Self Care | Payer: Medicare Other | Source: Ambulatory Visit | Attending: Orthopaedic Surgery | Admitting: Orthopaedic Surgery

## 2019-08-10 DIAGNOSIS — M545 Low back pain, unspecified: Secondary | ICD-10-CM

## 2019-08-12 DIAGNOSIS — H90A32 Mixed conductive and sensorineural hearing loss, unilateral, left ear with restricted hearing on the contralateral side: Secondary | ICD-10-CM | POA: Diagnosis not present

## 2019-08-12 DIAGNOSIS — H9113 Presbycusis, bilateral: Secondary | ICD-10-CM | POA: Diagnosis not present

## 2019-08-12 DIAGNOSIS — H9111 Presbycusis, right ear: Secondary | ICD-10-CM | POA: Diagnosis not present

## 2019-08-12 DIAGNOSIS — H9312 Tinnitus, left ear: Secondary | ICD-10-CM | POA: Diagnosis not present

## 2019-08-12 DIAGNOSIS — H9313 Tinnitus, bilateral: Secondary | ICD-10-CM | POA: Diagnosis not present

## 2019-08-12 DIAGNOSIS — H90A12 Conductive hearing loss, unilateral, left ear with restricted hearing on the contralateral side: Secondary | ICD-10-CM | POA: Diagnosis not present

## 2019-08-13 ENCOUNTER — Ambulatory Visit (INDEPENDENT_AMBULATORY_CARE_PROVIDER_SITE_OTHER): Payer: Medicare Other | Admitting: Orthopaedic Surgery

## 2019-08-13 ENCOUNTER — Encounter: Payer: Self-pay | Admitting: Orthopaedic Surgery

## 2019-08-13 ENCOUNTER — Other Ambulatory Visit: Payer: Self-pay

## 2019-08-13 DIAGNOSIS — M545 Low back pain, unspecified: Secondary | ICD-10-CM

## 2019-08-13 DIAGNOSIS — I2510 Atherosclerotic heart disease of native coronary artery without angina pectoris: Secondary | ICD-10-CM

## 2019-08-13 MED ORDER — DIAZEPAM 2 MG PO TABS: ORAL_TABLET | ORAL | 0 refills | Status: DC

## 2019-08-13 NOTE — Progress Notes (Signed)
Patient: Wendy Petty           Date of Birth: 29-Oct-1940           MRN: [id number] Visit Date: 08/13/2019 PCP: [name], MD   Assessment & Plan:  Chief Complaint:  Chief Complaint  Patient presents with  . Right Knee - Pain  . Left Knee - Pain  . Lower Back - Pain   Visit Diagnoses:  1. Lumbar pain     Plan: Patient is a pleasant 78 year old Spanish-speaking female who comes in today with an interpreter.  She is here to discuss MRI results of the lumbar spine.  It appears that she may have shown up for the MRI but due to movement during the MRI it was canceled.  We do not have any results from the scan.  At this point, we we will have the MRI rescheduled.  I will call in Valium to take for her claustrophobia.  She will follow-up with [place] once the MRI has been completed.  Follow-Up Instructions: Return for after MRI.   Orders:  No orders of the defined types were placed in this encounter.  No orders of the defined types were placed in this encounter.   Imaging: No new imaging  PMFS History: Patient Active Problem List   Diagnosis Date Noted  . Primary osteoarthritis of right knee 07/15/2019  . Low back pain 07/15/2019  . Primary osteoarthritis of left knee 06/01/2019  . Seasonal allergies 11/09/2018  . Nocturnal muscle cramp 11/09/2018  . Breast mass, right 11/23/2015  . 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 Ear noise/buzzing 10/30/2015  . Chronic low back pain 10/30/2015  . Type 2 diabetes mellitus with microalbuminuria ([place]) 09/29/2015  . Poorly fitting dentures 09/29/2015  . Hypothyroidism 09/29/2015  . Primary osteoarthritis of both knees 09/29/2015  . Spondylolisthesis 07/19/2015  . Overweight (BMI 25.0-29.9) 06/29/2015  . Memory deficit 11/28/2014  . Coronary atherosclerosis of native coronary artery 06/24/2013  . History of TIAs 06/24/2013  . GERD (gastroesophageal reflux disease) 06/24/2013  . Depression 12/31/2012   . THYROID NODULE 02/14/2009  . Elevated lipids 02/14/2009  . ANEMIA, CHRONIC 02/14/2009  . Essential hypertension 02/14/2009  . PULMONARY NODULE 02/14/2009   Past Medical History:  Diagnosis Date  . ANEMIA, CHRONIC   . 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 Breast mass, right 11/23/2015  . CHEST PAIN UNSPECIFIED   . Coronary atherosclerosis of native coronary artery 06/24/2013  . Depression 12/31/2012  . Diabetes mellitus without complication ([place]) [id number]  . Edema   . GERD (gastroesophageal reflux disease)   . History of TIAs 06/24/2013  . Hx of cardiovascular stress test    Lexiscan Myoview (11/14):  No ischemia, EF 68% (normal study)  . HYPERLIPIDEMIA   . Hypertension   . Hypothyroidism 09/29/2015  . Memory deficit 11/28/2014  . Primary osteoarthritis of both knees 09/29/2015  . PULMONARY NODULE   . THYROID NODULE   . Type 2 diabetes mellitus with microalbuminuria ([place]) 09/29/2015    Family History  Problem Relation Age of Onset  . Heart disease Mother   . Cancer Father        a tumor ruptured in his abd.    Past Surgical History:  Procedure Laterality Date  . LEFT HEART CATHETERIZATION WITH CORONARY ANGIOGRAM N/A 09/16/2011   Procedure: LEFT HEART CATHETERIZATION WITH CORONARY ANGIOGRAM;  Surgeon: [name], MD;  Location: [facility] CATH LAB;  Service:  Cardiovascular;  Laterality: N/A;  . TUBAL LIGATION    . VAGINAL HYSTERECTOMY  1989   Social History   Occupational History  . Not on file  Tobacco Use  . Smoking status: Never Smoker  . Smokeless tobacco: Never Used  Substance and Sexual Activity  . Alcohol use: No    Alcohol/week: 0.0 standard drinks  . Drug use: No  . Sexual activity: Never

## 2019-08-16 ENCOUNTER — Other Ambulatory Visit: Payer: Self-pay

## 2019-08-16 DIAGNOSIS — M545 Low back pain, unspecified: Secondary | ICD-10-CM

## 2019-08-19 ENCOUNTER — Other Ambulatory Visit: Payer: Self-pay | Admitting: Internal Medicine

## 2019-08-19 DIAGNOSIS — E785 Hyperlipidemia, unspecified: Secondary | ICD-10-CM

## 2019-08-31 ENCOUNTER — Other Ambulatory Visit: Payer: Self-pay | Admitting: Physician Assistant

## 2019-08-31 ENCOUNTER — Other Ambulatory Visit: Payer: Self-pay | Admitting: Internal Medicine

## 2019-08-31 DIAGNOSIS — I10 Essential (primary) hypertension: Secondary | ICD-10-CM

## 2019-08-31 DIAGNOSIS — E785 Hyperlipidemia, unspecified: Secondary | ICD-10-CM

## 2019-08-31 DIAGNOSIS — B353 Tinea pedis: Secondary | ICD-10-CM

## 2019-08-31 DIAGNOSIS — E038 Other specified hypothyroidism: Secondary | ICD-10-CM

## 2019-08-31 DIAGNOSIS — R413 Other amnesia: Secondary | ICD-10-CM

## 2019-08-31 DIAGNOSIS — L309 Dermatitis, unspecified: Secondary | ICD-10-CM

## 2019-09-13 ENCOUNTER — Telehealth: Payer: Self-pay

## 2019-09-13 NOTE — Telephone Encounter (Signed)
Zenia Resides from an attorney office in [place] called to speak with pcp regarding pt.   To reach [facility] Number- [phone] Cell Number- [phone]

## 2019-09-14 ENCOUNTER — Encounter: Payer: Self-pay | Admitting: Internal Medicine

## 2019-09-14 NOTE — Telephone Encounter (Signed)
PC returned to attorney Zenia Resides this a.m. I spoke with an assistant who stated that he was not in office at the time.  I asked that she let him know that I am returning his call and that I would  need a released from the patient to discuss any medical issues.  Mr. Zenia Resides called back and I was able to speak with him at 10:34 a.m. He told me that he has copies of the letters that I have written in the past for this patient regarding her medical condition.  It is to help her son whose case is coming up this week on Thursday.  He wanted to know whether I would be available on Thursday between the hours of 1-2 p.m to speak with a judge if need to support the case.  I told him that I will not be available during that time as I do have patients scheduled.  He asked if there is a time when I could be available, I told him that I am available on Wednesdays during my admit time.

## 2019-09-20 ENCOUNTER — Other Ambulatory Visit: Payer: Medicare Other

## 2019-09-21 ENCOUNTER — Ambulatory Visit
Admission: RE | Admit: 2019-09-21 | Discharge: 2019-09-21 | Disposition: A | Discharge status: Home / Self Care | Payer: Medicare Other | Source: Ambulatory Visit | Attending: Physician Assistant | Admitting: Physician Assistant

## 2019-09-21 ENCOUNTER — Other Ambulatory Visit: Payer: Self-pay

## 2019-09-21 DIAGNOSIS — M545 Low back pain, unspecified: Secondary | ICD-10-CM

## 2019-09-22 NOTE — Progress Notes (Signed)
F/u to discuss

## 2019-09-23 ENCOUNTER — Encounter: Payer: Self-pay | Admitting: Orthopaedic Surgery

## 2019-09-23 ENCOUNTER — Ambulatory Visit (INDEPENDENT_AMBULATORY_CARE_PROVIDER_SITE_OTHER): Payer: Medicare Other | Admitting: Orthopaedic Surgery

## 2019-09-23 ENCOUNTER — Other Ambulatory Visit: Payer: Self-pay

## 2019-09-23 DIAGNOSIS — M545 Low back pain, unspecified: Secondary | ICD-10-CM

## 2019-09-23 DIAGNOSIS — M4316 Spondylolisthesis, lumbar region: Secondary | ICD-10-CM

## 2019-09-23 MED ORDER — HYDROCODONE-ACETAMINOPHEN 5-325 MG PO TABS: 1.0000 | ORAL_TABLET | Freq: Three times a day (TID) | ORAL | 0 refills | Status: DC | PRN

## 2019-09-23 MED ORDER — ONDANSETRON HCL 4 MG PO TABS: 4.0000 mg | ORAL_TABLET | Freq: Three times a day (TID) | ORAL | 0 refills | Status: DC | PRN

## 2019-09-23 NOTE — Progress Notes (Signed)
Office Visit Note   Patient: Wendy Petty           Date of Birth: 1941-05-31           MRN: [id number] Visit Date: 09/23/2019              Requested by: [name], MD [address] [place],  [place] [address] PCP: [name], MD   Assessment & Plan: Visit Diagnoses:  1. Spondylolisthesis of lumbar region     Plan: Impression is multilevel degenerative changes worse at L4-5 and L5-S1.  We will refer the patient to Dr. Ernestina Patches for [facility].  Of called in 1 small prescription for Norco.  In regards to the incidental left kidney findings, I recommended follow-up with PCP.  Follow-up with [place] as needed.  Call with concerns or questions.  Follow-Up Instructions: Return for with Dr. Ernestina Patches for [facility].   Orders:  No orders of the defined types were placed in this encounter.  Meds ordered this encounter  Medications  . HYDROcodone-acetaminophen (NORCO) 5-325 MG tablet    Sig: Take 1 tablet by mouth 3 (three) times daily as needed for moderate pain.    Dispense:  15 tablet    Refill:  0  . ondansetron (ZOFRAN) 4 MG tablet    Sig: Take 1 tablet (4 mg total) by mouth every 8 (eight) hours as needed for nausea or vomiting.    Dispense:  40 tablet    Refill:  0      Procedures: No procedures performed   Clinical Data: No additional findings.   Subjective: Chief Complaint  Patient presents with  . Lower Back - Pain    HPI patient is a pleasant 79 year old female who comes in today to discuss MRI results of her lumbar spine.  She is here with a [place] interpreter.  She has been dealing with chronic low back pain for over a decade.  MRI of the lumbar spine shows multilevel degenerative changes with foraminal stenosis worse at L4-5 and L5-S1.  She does have a 9 mm anterolisthesis L5 on S1 secondary to chronic bilateral L5 pars defects.  It is also noted that she has 2 lesions within the left kidney likely representing cysts.     Objective: Vital Signs: There  were no vitals taken for this visit.    Ortho Exam stable lumbar exam  Specialty Comments:  No specialty comments available.  Imaging: No new imaging   PMFS History: Patient Active Problem List   Diagnosis Date Noted  . Primary osteoarthritis of right knee 07/15/2019  . Low back pain 07/15/2019  . Primary osteoarthritis of left knee 06/01/2019  . Seasonal allergies 11/09/2018  . Nocturnal muscle cramp 11/09/2018  . Breast mass, right 11/23/2015  . 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 Ear noise/buzzing 10/30/2015  . Chronic low back pain 10/30/2015  . Type 2 diabetes mellitus with microalbuminuria ([place]) 09/29/2015  . Poorly fitting dentures 09/29/2015  . Hypothyroidism 09/29/2015  . Primary osteoarthritis of both knees 09/29/2015  . Spondylolisthesis 07/19/2015  . Overweight (BMI 25.0-29.9) 06/29/2015  . Memory deficit 11/28/2014  . Coronary atherosclerosis of native coronary artery 06/24/2013  . History of TIAs 06/24/2013  . GERD (gastroesophageal reflux disease) 06/24/2013  . Depression 12/31/2012  . THYROID NODULE 02/14/2009  . Elevated lipids 02/14/2009  . ANEMIA, CHRONIC 02/14/2009  . Essential hypertension 02/14/2009  . PULMONARY NODULE 02/14/2009   Past Medical History:  Diagnosis Date  . ANEMIA,  CHRONIC   . 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 Breast mass, right 11/23/2015  . CHEST PAIN UNSPECIFIED   . Coronary atherosclerosis of native coronary artery 06/24/2013  . Depression 12/31/2012  . Diabetes mellitus without complication ([place]) [id number]  . Edema   . GERD (gastroesophageal reflux disease)   . History of TIAs 06/24/2013  . Hx of cardiovascular stress test    Lexiscan Myoview (11/14):  No ischemia, EF 68% (normal study)  . HYPERLIPIDEMIA   . Hypertension   . Hypothyroidism 09/29/2015  . Memory deficit 11/28/2014  . Primary osteoarthritis of both knees 09/29/2015  . PULMONARY NODULE   . THYROID NODULE   . Type 2 diabetes  mellitus with microalbuminuria ([place]) 09/29/2015    Family History  Problem Relation Age of Onset  . Heart disease Mother   . Cancer Father        a tumor ruptured in his abd.    Past Surgical History:  Procedure Laterality Date  . LEFT HEART CATHETERIZATION WITH CORONARY ANGIOGRAM N/A 09/16/2011   Procedure: LEFT HEART CATHETERIZATION WITH CORONARY ANGIOGRAM;  Surgeon: [name], MD;  Location: [facility] CATH LAB;  Service: Cardiovascular;  Laterality: N/A;  . TUBAL LIGATION    . VAGINAL HYSTERECTOMY  1989   Social History   Occupational History  . Not on file  Tobacco Use  . Smoking status: Never Smoker  . Smokeless tobacco: Never Used  Substance and Sexual Activity  . Alcohol use: No    Alcohol/week: 0.0 standard drinks  . Drug use: No  . Sexual activity: Never

## 2019-09-29 ENCOUNTER — Ambulatory Visit: Payer: Self-pay

## 2019-09-29 ENCOUNTER — Ambulatory Visit (INDEPENDENT_AMBULATORY_CARE_PROVIDER_SITE_OTHER): Payer: Medicare Other | Admitting: Physical Medicine and Rehabilitation

## 2019-09-29 ENCOUNTER — Other Ambulatory Visit: Payer: Self-pay

## 2019-09-29 ENCOUNTER — Encounter: Payer: Self-pay | Admitting: Physical Medicine and Rehabilitation

## 2019-09-29 VITALS — BP 161/80 | HR 70

## 2019-09-29 DIAGNOSIS — M4316 Spondylolisthesis, lumbar region: Secondary | ICD-10-CM

## 2019-09-29 DIAGNOSIS — M5416 Radiculopathy, lumbar region: Secondary | ICD-10-CM

## 2019-09-29 MED ORDER — METHYLPREDNISOLONE ACETATE 80 MG/ML IJ SUSP: 40.0000 mg | Freq: Once | INTRAMUSCULAR | Status: AC

## 2019-09-29 NOTE — Progress Notes (Signed)
 .  Numeric Pain Rating Scale and Functional Assessment Average Pain 8   In the last MONTH (on 0-10 scale) has pain interfered with the following?  1. General activity like being  able to carry out your everyday physical activities such as walking, climbing stairs, carrying groceries, or moving a chair?  Rating(8)   +Driver, -BT, -Dye Allergies.  

## 2019-10-01 ENCOUNTER — Encounter: Payer: Self-pay | Admitting: Internal Medicine

## 2019-10-01 ENCOUNTER — Other Ambulatory Visit: Payer: Self-pay

## 2019-10-01 ENCOUNTER — Ambulatory Visit: Payer: Medicare Other | Attending: Internal Medicine | Admitting: Internal Medicine

## 2019-10-01 ENCOUNTER — Other Ambulatory Visit: Payer: Self-pay | Admitting: Physician Assistant

## 2019-10-01 VITALS — BP 195/79 | HR 64 | Temp 98.5°F | Resp 16 | Wt 144.2 lb

## 2019-10-01 DIAGNOSIS — I129 Hypertensive chronic kidney disease with stage 1 through stage 4 chronic kidney disease, or unspecified chronic kidney disease: Secondary | ICD-10-CM | POA: Diagnosis not present

## 2019-10-01 DIAGNOSIS — K219 Gastro-esophageal reflux disease without esophagitis: Secondary | ICD-10-CM

## 2019-10-01 DIAGNOSIS — Z808 Family history of malignant neoplasm of other organs or systems: Secondary | ICD-10-CM | POA: Diagnosis not present

## 2019-10-01 DIAGNOSIS — Z79899 Other long term (current) drug therapy: Secondary | ICD-10-CM | POA: Diagnosis not present

## 2019-10-01 DIAGNOSIS — E785 Hyperlipidemia, unspecified: Secondary | ICD-10-CM | POA: Diagnosis not present

## 2019-10-01 DIAGNOSIS — Z7984 Long term (current) use of oral hypoglycemic drugs: Secondary | ICD-10-CM | POA: Diagnosis not present

## 2019-10-01 DIAGNOSIS — Z791 Long term (current) use of non-steroidal anti-inflammatories (NSAID): Secondary | ICD-10-CM | POA: Diagnosis not present

## 2019-10-01 DIAGNOSIS — G8929 Other chronic pain: Secondary | ICD-10-CM | POA: Diagnosis not present

## 2019-10-01 DIAGNOSIS — Z8249 Family history of ischemic heart disease and other diseases of the circulatory system: Secondary | ICD-10-CM | POA: Insufficient documentation

## 2019-10-01 DIAGNOSIS — E1159 Type 2 diabetes mellitus with other circulatory complications: Secondary | ICD-10-CM | POA: Diagnosis not present

## 2019-10-01 DIAGNOSIS — D22111 Melanocytic nevi of right upper eyelid, including canthus: Secondary | ICD-10-CM | POA: Insufficient documentation

## 2019-10-01 DIAGNOSIS — D229 Melanocytic nevi, unspecified: Secondary | ICD-10-CM | POA: Diagnosis not present

## 2019-10-01 DIAGNOSIS — M545 Low back pain, unspecified: Secondary | ICD-10-CM

## 2019-10-01 DIAGNOSIS — E1122 Type 2 diabetes mellitus with diabetic chronic kidney disease: Secondary | ICD-10-CM | POA: Insufficient documentation

## 2019-10-01 DIAGNOSIS — Z90710 Acquired absence of both cervix and uterus: Secondary | ICD-10-CM | POA: Insufficient documentation

## 2019-10-01 DIAGNOSIS — Z79890 Hormone replacement therapy (postmenopausal): Secondary | ICD-10-CM | POA: Diagnosis not present

## 2019-10-01 DIAGNOSIS — Z7902 Long term (current) use of antithrombotics/antiplatelets: Secondary | ICD-10-CM | POA: Diagnosis not present

## 2019-10-01 DIAGNOSIS — I10 Essential (primary) hypertension: Secondary | ICD-10-CM | POA: Diagnosis not present

## 2019-10-01 DIAGNOSIS — N183 Chronic kidney disease, stage 3 unspecified: Secondary | ICD-10-CM | POA: Insufficient documentation

## 2019-10-01 DIAGNOSIS — Z882 Allergy status to sulfonamides status: Secondary | ICD-10-CM | POA: Diagnosis not present

## 2019-10-01 DIAGNOSIS — Z8673 Personal history of transient ischemic attack (TIA), and cerebral infarction without residual deficits: Secondary | ICD-10-CM | POA: Insufficient documentation

## 2019-10-01 DIAGNOSIS — M170 Bilateral primary osteoarthritis of knee: Secondary | ICD-10-CM | POA: Diagnosis present

## 2019-10-01 DIAGNOSIS — I2510 Atherosclerotic heart disease of native coronary artery without angina pectoris: Secondary | ICD-10-CM | POA: Diagnosis not present

## 2019-10-01 DIAGNOSIS — R413 Other amnesia: Secondary | ICD-10-CM | POA: Diagnosis not present

## 2019-10-01 DIAGNOSIS — Z9851 Tubal ligation status: Secondary | ICD-10-CM | POA: Diagnosis not present

## 2019-10-01 DIAGNOSIS — E038 Other specified hypothyroidism: Secondary | ICD-10-CM | POA: Diagnosis not present

## 2019-10-01 LAB — POCT GLYCOSYLATED HEMOGLOBIN (HGB A1C): HbA1c, POC (controlled diabetic range): 7.4 % — AB (ref 0.0–7.0)

## 2019-10-01 MED ORDER — CHLORTHALIDONE 25 MG PO TABS: 12.5000 mg | ORAL_TABLET | Freq: Every day | ORAL | 3 refills | Status: AC

## 2019-10-01 MED ORDER — CLOPIDOGREL BISULFATE 75 MG PO TABS: 75.0000 mg | ORAL_TABLET | Freq: Every day | ORAL | 3 refills | Status: DC

## 2019-10-01 MED ORDER — MEMANTINE HCL 10 MG PO TABS: 10.0000 mg | ORAL_TABLET | Freq: Every day | ORAL | 3 refills | Status: DC

## 2019-10-01 MED ORDER — CARVEDILOL 25 MG PO TABS: 25.0000 mg | ORAL_TABLET | Freq: Two times a day (BID) | ORAL | 3 refills | Status: AC

## 2019-10-01 MED ORDER — OMEPRAZOLE 20 MG PO CPDR: DELAYED_RELEASE_CAPSULE | ORAL | 11 refills | Status: DC

## 2019-10-01 MED ORDER — ATORVASTATIN CALCIUM 20 MG PO TABS: 20.0000 mg | ORAL_TABLET | Freq: Every day | ORAL | 3 refills | Status: DC

## 2019-10-01 MED ORDER — METFORMIN HCL 500 MG PO TABS: 250.0000 mg | ORAL_TABLET | Freq: Every day | ORAL | 6 refills | Status: DC

## 2019-10-01 MED ORDER — METFORMIN HCL 500 MG PO TABS: 250.0000 mg | ORAL_TABLET | Freq: Every day | ORAL | 6 refills | Status: AC

## 2019-10-01 MED ORDER — DICLOFENAC SODIUM 1 % EX GEL: 2.0000 g | Freq: Four times a day (QID) | CUTANEOUS | 6 refills | Status: DC

## 2019-10-01 MED ORDER — LEVOTHYROXINE SODIUM 25 MCG PO TABS: ORAL_TABLET | ORAL | 12 refills | Status: DC

## 2019-10-01 MED ORDER — ACETAMINOPHEN ER 650 MG PO TBCR: 650.0000 mg | EXTENDED_RELEASE_TABLET | Freq: Three times a day (TID) | ORAL | 5 refills | Status: DC | PRN

## 2019-10-01 MED ORDER — LISINOPRIL 20 MG PO TABS: ORAL_TABLET | ORAL | 3 refills | Status: DC

## 2019-10-01 NOTE — Progress Notes (Signed)
Patient ID: Nikyla Petty, female    DOB: 05-29-1941  MRN: [id number]  CC: Medical Clearance   Subjective: Wendy Petty is a 79 y.o. female who presents for what was supposed to be preoperative evaluation for knee replacement surgery.  Her daughter Wendy Petty is with her and interprets Her concerns today include:  Hx ofHTN, DM,CKD 3,hypothyroid, GERD, coronary atherosclerosis, HL,lacunar CVA,OA back, hip and knees.  Since last visit with me, patient saw her orthopedics Dr. Erlinda Hong again.  She told him about the pain that she has been having in her back.  She is diagnosed with spondylolisthesis of the lumbar spine.  She was referred to Dr. Ernestina Patches for [facility] and was given a limited prescription for Norco. -Patient states that she had the injection to her back 2 days ago. -She has decided to wait for at least 6 months for having the knee replacement surgery.  HTN: Blood pressure today is noted to be elevated.  She has not taken her medicines as yet today.  She limits salt in the foods.  Denies any chest pains or shortness of breath.  She has a flat mole on her left lower leg which she states she has had for a while but she thinks it is increasing in size.  She would like for me to take a look at it.  She states that it itches.  Diabetes type 2: She has been diet controlled.  She does well with her eating habits.  She tries to avoid sugary foods.  She is requesting printed prescriptions for all of her medications.  She states that she uses good Rx Patient Active Problem List   Diagnosis Date Noted  . Primary osteoarthritis of right knee 07/15/2019  . Low back pain 07/15/2019  . Primary osteoarthritis of left knee 06/01/2019  . Seasonal allergies 11/09/2018  . Nocturnal muscle cramp 11/09/2018  . Breast mass, right 11/23/2015  . 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 Ear noise/buzzing 10/30/2015  . Chronic low back pain 10/30/2015  . Type 2 diabetes mellitus with  microalbuminuria ([place]) 09/29/2015  . Poorly fitting dentures 09/29/2015  . Hypothyroidism 09/29/2015  . Primary osteoarthritis of both knees 09/29/2015  . Spondylolisthesis 07/19/2015  . Overweight (BMI 25.0-29.9) 06/29/2015  . Memory deficit 11/28/2014  . Coronary atherosclerosis of native coronary artery 06/24/2013  . History of TIAs 06/24/2013  . GERD (gastroesophageal reflux disease) 06/24/2013  . Depression 12/31/2012  . THYROID NODULE 02/14/2009  . Elevated lipids 02/14/2009  . ANEMIA, CHRONIC 02/14/2009  . Essential hypertension 02/14/2009  . PULMONARY NODULE 02/14/2009     Current Outpatient Medications on File Prior to Visit  Medication Sig Dispense Refill  . acetaminophen (TYLENOL 8 HOUR) 650 MG CR tablet Take 1 tablet (650 mg total) by mouth every 8 (eight) hours as needed for pain. 90 tablet 5  . atorvastatin (LIPITOR) 20 MG tablet TAKE 1 TABLET(20 MG) BY MOUTH DAILY 90 tablet 0  . carvedilol (COREG) 25 MG tablet TAKE 1 TABLET BY MOUTH TWICE DAILY WITH FOOD 180 tablet 0  . chlorthalidone (HYGROTON) 25 MG tablet Take 0.5 tablets (12.5 mg total) by mouth daily. Stop hydrochlorothiazide. 90 tablet 2  . clopidogrel (PLAVIX) 75 MG tablet Take 1 tablet (75 mg total) by mouth daily. 90 tablet 1  . clotrimazole-betamethasone (LOTRISONE) cream APPLY EXTERNALLY TO THE AFFECTED AREA TWICE DAILY 30 g 0  . diazepam (VALIUM) 2 MG tablet Take on tablet one hour prior to mri and then  repeat right before mri if needed (Patient not taking: Reported on 10/01/2019) 2 tablet 0  . diclofenac sodium (VOLTAREN) 1 % GEL Apply 2 g topically 4 (four) times daily. 100 g 2  . fluticasone (FLONASE) 50 MCG/ACT nasal spray Place 1-2 sprays into both nostrils daily for 7 days. 1 g 0  . HYDROcodone-acetaminophen (NORCO) 5-325 MG tablet Take 1 tablet by mouth 3 (three) times daily as needed for moderate pain. 15 tablet 0  . levothyroxine (SYNTHROID) 25 MCG tablet TAKE 1 TABLET(25 MCG) BY MOUTH DAILY 30 tablet  2  . lisinopril (ZESTRIL) 20 MG tablet TAKE 1 TABLET (20 MG TOTAL) BY MOUTH DAILY. DOSE INCREASE 90 tablet 2  . loratadine (CLARITIN) 10 MG tablet Take 1 tablet (10 mg total) by mouth daily. 20 tablet 0  . memantine (NAMENDA) 10 MG tablet TAKE 1 TABLET BY MOUTH DAILY 90 tablet 0  . nitroGLYCERIN (NITROSTAT) 0.4 MG SL tablet Place 1 tablet (0.4 mg total) under the tongue every 5 (five) minutes as needed for chest pain. 15 tablet 2  . omeprazole (PRILOSEC) 20 MG capsule TAKE 1 CAPSULE(20 MG) BY MOUTH DAILY 30 capsule 2  . ondansetron (ZOFRAN) 4 MG tablet Take 1 tablet (4 mg total) by mouth every 8 (eight) hours as needed for nausea or vomiting. 40 tablet 0  . triamcinolone cream (KENALOG) 0.1 % APPLY EXTERNALLY TO THE AFFECTED AREA TWICE DAILY 30 g 0  . [DISCONTINUED] Cetirizine HCl 10 MG CAPS Take 1 capsule (10 mg total) by mouth daily for 15 days. 15 capsule 0   Current Facility-Administered Medications on File Prior to Visit  Medication Dose Route Frequency Provider Last Rate Last Admin  . methylPREDNISolone acetate (DEPO-MEDROL) injection 40 mg  40 mg Other Once Magnus Sinning, MD        Allergies  Allergen Reactions  . Hydrochlorothiazide     Causes numbness on the sole of her feet.    Social History   Socioeconomic History  . Marital status: Married    Spouse name: Not on file  . Number of children: Not on file  . Years of education: Not on file  . Highest education level: Not on file  Occupational History  . Not on file  Tobacco Use  . Smoking status: Never Smoker  . Smokeless tobacco: Never Used  Substance and Sexual Activity  . Alcohol use: No    Alcohol/week: 0.0 standard drinks  . Drug use: No  . Sexual activity: Never  Other Topics Concern  . Not on file  Social History Narrative   Lives with daughter in a 2 story home.  Does not work.    Social Determinants of Health   Financial Resource Strain:   . Difficulty of Paying Living Expenses: Not on file  Food  Insecurity:   . Worried About Charity fundraiser in the Last Year: Not on file  . Ran Out of Food in the Last Year: Not on file  Transportation Needs:   . Lack of Transportation (Medical): Not on file  . Lack of Transportation (Non-Medical): Not on file  Physical Activity:   . Days of Exercise per Week: Not on file  . Minutes of Exercise per Session: Not on file  Stress:   . Feeling of Stress : Not on file  Social Connections:   . Frequency of Communication with Friends and Family: Not on file  . Frequency of Social Gatherings with Friends and Family: Not on file  . Attends Religious  Services: Not on file  . Active Member of Clubs or Organizations: Not on file  . Attends Archivist Meetings: Not on file  . Marital Status: Not on file  Intimate Partner Violence:   . Fear of Current or Ex-Partner: Not on file  . Emotionally Abused: Not on file  . Physically Abused: Not on file  . Sexually Abused: Not on file    Family History  Problem Relation Age of Onset  . Heart disease Mother   . Cancer Father        a tumor ruptured in his abd.    Past Surgical History:  Procedure Laterality Date  . LEFT HEART CATHETERIZATION WITH CORONARY ANGIOGRAM N/A 09/16/2011   Procedure: LEFT HEART CATHETERIZATION WITH CORONARY ANGIOGRAM;  Surgeon: [name], MD;  Location: [facility] CATH LAB;  Service: Cardiovascular;  Laterality: N/A;  . TUBAL LIGATION    . VAGINAL HYSTERECTOMY  1989    ROS: Review of Systems Negative except as stated above  PHYSICAL EXAM: BP (!) 195/79   Pulse 64   Temp 98.5 F (36.9 C) (Oral)   Resp 16   Wt 144 lb 3.2 oz (65.4 kg)   SpO2 100%   BMI 28.16 kg/m   Physical Exam  General appearance - alert, well appearing, and in no distress Mental status - normal mood, behavior, speech, dress, motor activity, and thought processes Mouth - mucous membranes moist, pharynx normal without lesions Neck - supple, no significant adenopathy Chest - clear to  auscultation, no wheezes, rales or rhonchi, symmetric air entry Heart - normal rate, regular rhythm, normal S1, S2, no murmurs, rubs, clicks or gallops Extremities - peripheral pulses normal, no pedal edema, no clubbing or cyanosis Skin -1 cm flat mole on the left lower leg lateral aspect of the calf  EKG: Normal sinus rhythm.  Normal EKG Results for orders placed or performed in visit on 10/01/19  POCT glycosylated hemoglobin (Hb A1C)  Result Value Ref Range   Hemoglobin A1C     HbA1c POC (<> result, manual entry)     HbA1c, POC (prediabetic range)     HbA1c, POC (controlled diabetic range) 7.4 (A) 0.0 - 7.0 %    CMP Latest Ref Rng & Units 06/09/2019 03/17/2019 11/09/2018  Glucose 70 - 99 mg/dL 144(H) 108(H) 93  BUN 8 - 23 mg/dL 27(H) 29(H) 29(H)  Creatinine 0.44 - 1.00 mg/dL 1.28(H) 1.16(H) 1.11(H)  Sodium 135 - 145 mmol/L 139 140 141  Potassium 3.5 - 5.1 mmol/L 4.3 3.9 4.2  Chloride 98 - 111 mmol/L 104 102 104  CO2 22 - 32 mmol/L 23 23 24   Calcium 8.9 - 10.3 mg/dL 8.8(L) 9.2 9.1  Total Protein 6.0 - 8.5 g/dL - 7.1 -  Total Bilirubin 0.0 - 1.2 mg/dL - 0.3 -  Alkaline Phos 39 - 117 IU/L - 80 -  AST 0 - 40 IU/L - 18 -  ALT 0 - 32 IU/L - 14 -   Lipid Panel     Component Value Date/Time   CHOL 145 06/18/2018 1210   TRIG 201 (H) 06/18/2018 1210   HDL 36 (L) 06/18/2018 1210   CHOLHDL 4.0 06/18/2018 1210   CHOLHDL 3.7 05/21/2016 1236   VLDL 43 (H) 05/21/2016 1236   LDLCALC 69 06/18/2018 1210    CBC    Component Value Date/Time   WBC 7.5 06/09/2019 1707   RBC 4.24 06/09/2019 1707   HGB 12.8 06/09/2019 1707   HGB 12.2 06/18/2018 1210  HCT 39.3 06/09/2019 1707   HCT 37.6 06/18/2018 1210   PLT 189 06/09/2019 1707   PLT 238 06/18/2018 1210   MCV 92.7 06/09/2019 1707   MCV 90 06/18/2018 1210   MCH 30.2 06/09/2019 1707   MCHC 32.6 06/09/2019 1707   RDW 12.4 06/09/2019 1707   RDW 12.0 (L) 06/18/2018 1210   LYMPHSABS 1.9 06/18/2018 1210   MONOABS 0.5 06/28/2013 1820    EOSABS 0.3 06/18/2018 1210   BASOSABS 0.0 06/18/2018 1210    ASSESSMENT AND PLAN: 1. Primary osteoarthritis of both knees -This visit was supposed to be for medical clearance for right knee arthroplasty.  However patient tells me that she is going to wait 6 months before having surgery done.  I made her understand that she would need to have preoperative clearance within at least 1 month of surgery.  She is adamant that she will not be having surgery within the next month - acetaminophen (TYLENOL 8 HOUR) 650 MG CR tablet; Take 1 tablet (650 mg total) by mouth every 8 (eight) hours as needed for pain.  Dispense: 90 tablet; Refill: 5  2. Chronic midline low back pain, unspecified whether sciatica present Had ESI to the lumbar spine.  She tells me that the back is better  3. Essential hypertension Uncontrolled.  She has not taken meds as yet for today.  She will take them when she returns home - carvedilol (COREG) 25 MG tablet; Take 1 tablet (25 mg total) by mouth 2 (two) times daily with a meal.  Dispense: 180 tablet; Refill: 3 - lisinopril (ZESTRIL) 20 MG tablet; TAKE 1 TABLET (20 MG TOTAL) BY MOUTH DAILY. DOSE INCREASE  Dispense: 90 tablet; Refill: 3 - chlorthalidone (HYGROTON) 25 MG tablet; Take 0.5 tablets (12.5 mg total) by mouth daily. Stop hydrochlorothiazide.  Dispense: 90 tablet; Refill: 3 - Basic Metabolic Panel  4. Type 2 diabetes mellitus with other circulatory complication, without long-term current use of insulin (HCC) A1c today is above 7.  Dietary counseling given.  We will try her on very low-dose of Metformin.  Check BMP to see where her creatinine and GFR looks like - metFORMIN (GLUCOPHAGE) 500 MG tablet; Take 0.5 tablets (250 mg total) by mouth daily with breakfast.  Dispense: 30 tablet; Refill: 6 - POCT glycosylated hemoglobin (Hb A1C)  5. Change in skin mole - Ambulatory referral to Dermatology  6. Hyperlipidemia, unspecified hyperlipidemia type - atorvastatin  (LIPITOR) 20 MG tablet; Take 1 tablet (20 mg total) by mouth at bedtime.  Dispense: 90 tablet; Refill: 3  7. Other specified hypothyroidism - levothyroxine (SYNTHROID) 25 MCG tablet; TAKE 1 TABLET(25 MCG) BY MOUTH DAILY  Dispense: 30 tablet; Refill: 12 - TSH  8. Memory deficit Patient requested refill on Nam[place] - memantine (NAMENDA) 10 MG tablet; Take 1 tablet (10 mg total) by mouth daily.  Dispense: 90 tablet; Refill: 3  9. Gastroesophageal reflux disease Patient requested refill on omeprazole - omeprazole (PRILOSEC) 20 MG capsule; TAKE 1 CAPSULE(20 MG) BY MOUTH DAILY  Dispense: 30 capsule; Refill: 11     Patient was given the opportunity to ask questions.  Patient verbalized understanding of the plan and was able to repeat key elements of the plan.  .[employer] interpreter used during this encounter.   No orders of the defined types were placed in this encounter.    Requested Prescriptions    No prescriptions requested or ordered in this encounter    No follow-ups on file.  Karle Plumber, MD, FACP

## 2019-10-02 LAB — BASIC METABOLIC PANEL
BUN/Creatinine Ratio: 19 (ref 12–28)
BUN: 24 mg/dL (ref 8–27)
CO2: 26 mmol/L (ref 20–29)
Calcium: 9.6 mg/dL (ref 8.7–10.3)
Chloride: 102 mmol/L (ref 96–106)
Creatinine, Ser: 1.25 mg/dL — ABNORMAL HIGH (ref 0.57–1.00)
GFR calc Af Amer: 48 mL/min/{1.73_m2} — ABNORMAL LOW (ref >59)
GFR calc non Af Amer: 42 mL/min/{1.73_m2} — ABNORMAL LOW (ref >59)
Glucose: 100 mg/dL — ABNORMAL HIGH (ref 65–99)
Potassium: 4.2 mmol/L (ref 3.5–5.2)
Sodium: 140 mmol/L (ref 134–144)

## 2019-10-02 LAB — TSH: TSH: 0.922 u[IU]/mL (ref 0.450–4.500)

## 2019-10-05 ENCOUNTER — Telehealth: Payer: Self-pay

## 2019-10-05 NOTE — Telephone Encounter (Signed)
[employer] Tobin Chad  Id# [id number]  contacted pt to go over lab results pt didn't answer and was unable to to lvm due to vm being full

## 2019-10-14 MED ORDER — LABETALOL HCL 5 MG/ML IV SOLN
10.00 | INTRAVENOUS | Status: DC
Start: ? — End: 2019-10-14

## 2019-10-14 MED ORDER — GENERIC EXTERNAL MEDICATION
0.10 | Status: DC
Start: ? — End: 2019-10-14

## 2019-10-17 MED ORDER — GENERIC EXTERNAL MEDICATION
Status: DC
Start: ? — End: 2019-10-17

## 2019-10-17 MED ORDER — ATORVASTATIN CALCIUM 80 MG PO TABS
80.00 | ORAL_TABLET | ORAL | Status: DC
Start: ? — End: 2019-10-17

## 2019-10-17 MED ORDER — PANTOPRAZOLE SODIUM 20 MG PO TBEC
20.00 | DELAYED_RELEASE_TABLET | ORAL | Status: DC
Start: 2019-10-17 — End: 2019-10-17

## 2019-10-17 MED ORDER — HYDRALAZINE HCL 20 MG/ML IJ SOLN
10.00 | INTRAMUSCULAR | Status: DC
Start: ? — End: 2019-10-17

## 2019-10-17 MED ORDER — MEMANTINE HCL 10 MG PO TABS
10.00 | ORAL_TABLET | ORAL | Status: DC
Start: 2019-10-16 — End: 2019-10-17

## 2019-10-17 MED ORDER — POTASSIUM CHLORIDE CRYS ER 20 MEQ PO TBCR
40.00 | EXTENDED_RELEASE_TABLET | ORAL | Status: DC
Start: 2019-10-16 — End: 2019-10-17

## 2019-10-17 MED ORDER — LABETALOL HCL 5 MG/ML IV SOLN
10.00 | INTRAVENOUS | Status: DC
Start: ? — End: 2019-10-17

## 2019-10-17 MED ORDER — HEPARIN SODIUM (PORCINE) 5000 UNIT/ML IJ SOLN
5000.00 | INTRAMUSCULAR | Status: DC
Start: 2019-10-16 — End: 2019-10-17

## 2019-10-17 MED ORDER — ONDANSETRON HCL 4 MG/2ML IJ SOLN
4.00 | INTRAMUSCULAR | Status: DC
Start: ? — End: 2019-10-17

## 2019-10-17 MED ORDER — LEVOTHYROXINE SODIUM 25 MCG PO TABS
25.00 | ORAL_TABLET | ORAL | Status: DC
Start: 2019-10-17 — End: 2019-10-17

## 2019-10-17 MED ORDER — DSS 100 MG PO CAPS
100.00 | ORAL_CAPSULE | ORAL | Status: DC
Start: 2019-10-16 — End: 2019-10-17

## 2019-10-17 MED ORDER — DONEPEZIL HCL 5 MG PO TABS
5.00 | ORAL_TABLET | ORAL | Status: DC
Start: 2019-10-16 — End: 2019-10-17

## 2019-10-17 MED ORDER — CITRATE OF MAGNESIA PO SOLN
150.00 | ORAL | Status: DC
Start: ? — End: 2019-10-17

## 2019-10-17 MED ORDER — SODIUM CHLORIDE 0.9 % IV SOLN
10.00 | INTRAVENOUS | Status: DC
Start: ? — End: 2019-10-17

## 2019-10-17 MED ORDER — MUPIROCIN 2 % EX OINT
TOPICAL_OINTMENT | CUTANEOUS | Status: DC
Start: 2019-10-16 — End: 2019-10-17

## 2019-10-17 MED ORDER — ASPIRIN 325 MG PO TABS
325.00 | ORAL_TABLET | ORAL | Status: DC
Start: ? — End: 2019-10-17

## 2019-10-24 MED ORDER — SODIUM CHLORIDE 0.9 % IV SOLN
10.00 | INTRAVENOUS | Status: DC
Start: ? — End: 2019-10-24

## 2019-10-24 MED ORDER — GENERIC EXTERNAL MEDICATION
Status: DC
Start: ? — End: 2019-10-24

## 2019-10-25 MED ORDER — GENERIC EXTERNAL MEDICATION
Status: DC
Start: ? — End: 2019-10-25

## 2019-10-25 NOTE — Progress Notes (Deleted)
Cardiology Office Note   Date:  10/25/2019   ID:  Denaja Hohl, DOB [birth year], MRN [id number]  PCP:  [name], MD  Cardiologist:  [name]    No chief complaint on file.     History of Present Illness:  79 y.o. [name] female with HTN, HLD, Low thyroid Dementia and atypical chest pain.   Cath January 2013 with moderate disease Rx medically : Mid LAD 40, ostial D2 90 (small caliber), distal D3 20-30, distal RCA 40-50, mid PDA 30, EF 55-60%.  F/U myovue 07/14/13 normal EF 68%   Seen by PA 08/26/17 ACE increased for BP in addition to diuretic and norvasc stopped due to edema Activity limited by arthritis NSAI's upset stomach and uses omeprazole for this    Does live independently with help from son and daughter   Seen [facility] ER for confusion weakness vomiting She had a neurology televisit Note indicates nystagmus and left facial droop ? CT at that time showing small lacunar infarct bilateral basal ganglia, right caudate and right centrum semiovale No acute abnormalities and she was actually given TPA She was already on ASA/Plavix beta blocker and statin CTA showed no large vessel carotid disease BP was elevated and improved while in ER  Initially 177/94   Echo done 10/15/19 showed EF 51% negative bubble study normal LA size  MRI 10/15/19 post TPA no acute abnormality   Seen at [facility] ER [facility]  10/24/19 Complained of weakness, dizziness and nausea Had similar symptoms 2 weeks prior ? Diagnosed with stroke She had spinning sensation worse with head movements CT head negative ECG with no afib only NSR nonspecific ST changes She was d/c home on meclizine   Past Medical History:  Diagnosis Date  . ANEMIA, CHRONIC   . 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 Breast mass, right 11/23/2015  . CHEST PAIN UNSPECIFIED   . Coronary atherosclerosis of native coronary artery 06/24/2013  . Depression 12/31/2012  . Diabetes mellitus without complication ([place]) [id number]   . Edema   . GERD (gastroesophageal reflux disease)   . History of TIAs 06/24/2013  . Hx of cardiovascular stress test    Lexiscan Myoview (11/14):  No ischemia, EF 68% (normal study)  . HYPERLIPIDEMIA   . Hypertension   . Hypothyroidism 09/29/2015  . Memory deficit 11/28/2014  . Primary osteoarthritis of both knees 09/29/2015  . PULMONARY NODULE   . THYROID NODULE   . Type 2 diabetes mellitus with microalbuminuria ([place]) 09/29/2015    Past Surgical History:  Procedure Laterality Date  . LEFT HEART CATHETERIZATION WITH CORONARY ANGIOGRAM N/A 09/16/2011   Procedure: LEFT HEART CATHETERIZATION WITH CORONARY ANGIOGRAM;  Surgeon: [name], MD;  Location: [facility] CATH LAB;  Service: Cardiovascular;  Laterality: N/A;  . TUBAL LIGATION    . VAGINAL HYSTERECTOMY  1989     Current Outpatient Medications  Medication Sig Dispense Refill  . acetaminophen (TYLENOL 8 HOUR) 650 MG CR tablet Take 1 tablet (650 mg total) by mouth every 8 (eight) hours as needed for pain. 90 tablet 5  . atorvastatin (LIPITOR) 20 MG tablet Take 1 tablet (20 mg total) by mouth at bedtime. 90 tablet 3  . carvedilol (COREG) 25 MG tablet Take 1 tablet (25 mg total) by mouth 2 (two) times daily with a meal. 180 tablet 3  . chlorthalidone (HYGROTON) 25 MG tablet Take 0.5 tablets (12.5 mg total) by mouth daily. Stop hydrochlorothiazide. 90 tablet 3  . clopidogrel (PLAVIX) 75  MG tablet Take 1 tablet (75 mg total) by mouth daily. 90 tablet 3  . clotrimazole-betamethasone (LOTRISONE) cream APPLY EXTERNALLY TO THE AFFECTED AREA TWICE DAILY 30 g 0  . diclofenac Sodium (VOLTAREN) 1 % GEL Apply 2 g topically 4 (four) times daily. 100 g 6  . diclofenac Sodium (VOLTAREN) 1 % GEL APPLY 2 GRAMS EXTERNALLY TO THE AFFECTED AREA FOUR TIMES DAILY 100 g 6  . fluticasone (FLONASE) 50 MCG/ACT nasal spray Place 1-2 sprays into both nostrils daily for 7 days. 1 g 0  . HYDROcodone-acetaminophen (NORCO) 5-325 MG tablet Take 1 tablet by mouth 3  (three) times daily as needed for moderate pain. 15 tablet 0  . levothyroxine (SYNTHROID) 25 MCG tablet TAKE 1 TABLET(25 MCG) BY MOUTH DAILY 30 tablet 12  . lisinopril (ZESTRIL) 20 MG tablet TAKE 1 TABLET (20 MG TOTAL) BY MOUTH DAILY. DOSE INCREASE 90 tablet 3  . loratadine (CLARITIN) 10 MG tablet Take 1 tablet (10 mg total) by mouth daily. 20 tablet 0  . memantine (NAMENDA) 10 MG tablet Take 1 tablet (10 mg total) by mouth daily. 90 tablet 3  . metFORMIN (GLUCOPHAGE) 500 MG tablet Take 0.5 tablets (250 mg total) by mouth daily with breakfast. 30 tablet 6  . nitroGLYCERIN (NITROSTAT) 0.4 MG SL tablet Place 1 tablet (0.4 mg total) under the tongue every 5 (five) minutes as needed for chest pain. 15 tablet 2  . omeprazole (PRILOSEC) 20 MG capsule TAKE 1 CAPSULE(20 MG) BY MOUTH DAILY 90 capsule 0  . ondansetron (ZOFRAN) 4 MG tablet Take 1 tablet (4 mg total) by mouth every 8 (eight) hours as needed for nausea or vomiting. 40 tablet 0  . triamcinolone cream (KENALOG) 0.1 % APPLY EXTERNALLY TO THE AFFECTED AREA TWICE DAILY 30 g 0   Current Facility-Administered Medications  Medication Dose Route Frequency Provider Last Rate Last Admin  . methylPREDNISolone acetate (DEPO-MEDROL) injection 40 mg  40 mg Other Once Magnus Sinning, MD        Allergies:   Hydrochlorothiazide    Social History:  The patient  reports that she has never smoked. She has never used smokeless tobacco. She reports that she does not drink alcohol or use drugs.   Family History:  The patient's family history includes Cancer in her father; Heart disease in her mother.    ROS:  General:no colds or fevers, no weight changes Skin:no rashes or ulcers HEENT:no blurred vision, no congestion CV:see HPI PUL:see HPI GI:no diarrhea constipation or melena, no indigestion GU:no hematuria, no dysuria MS:no joint pain, no claudication Neuro:no syncope, no lightheadedness Endo:no diabetes, + thyroid disease  Wt Readings from Last 3  Encounters:  10/01/19 144 lb 3.2 oz (65.4 kg)  07/30/19 148 lb 6.4 oz (67.3 kg)  07/15/19 130 lb (59 kg)     PHYSICAL EXAM: There were no vitals taken for this visit. Affect appropriate Healthy:  appears stated age 59: normal Neck supple with no adenopathy JVP normal no bruits no thyromegaly Lungs clear with no wheezing and good diaphragmatic motion Heart:  S1/S2 no murmur, no rub, gallop or click PMI normal Abdomen: benighn, BS positve, no tenderness, no AAA no bruit.  No HSM or HJR Distal pulses intact with no bruits No edema Neuro non-focal Skin warm and dry No muscular weakness   EKG:   SR with freq PACs. No acute changes. 11/20/17    Recent Labs: 03/17/2019: ALT 14 06/09/2019: Hemoglobin 12.8; Platelets 189 10/01/2019: BUN 24; Creatinine, Ser 1.25; Potassium 4.2;  Sodium 140; TSH 0.922    Lipid Panel    Component Value Date/Time   CHOL 145 06/18/2018 1210   TRIG 201 (H) 06/18/2018 1210   HDL 36 (L) 06/18/2018 1210   CHOLHDL 4.0 06/18/2018 1210   CHOLHDL 3.7 05/21/2016 1236   VLDL 43 (H) 05/21/2016 1236   LDLCALC 69 06/18/2018 1210       Other studies Reviewed: Additional studies/ records that were reviewed today include: .  Cardiac cath  Cath [facility] 09/16/11  Medical RX only small diagonal disease  Final Conclusions:  1. Preserved LV function  2. Calcification of the coronaries, with scattered plaque as noted.  3. 90% small D2, in a vessel of 1-1.29mm  4. 50% mid RCA.  Recommendations:  1. Medical therapy.  Echo 12/06/15 Study Conclusions  - Left ventricle: The cavity size was normal. Wall thickness was   normal. Systolic function was normal. The estimated ejection   fraction was in the range of 60% to 65%. Wall motion was normal;   there were no regional wall motion abnormalities. - Aortic valve: Mildly calcified annulus. Mildly thickened   leaflets.  Nuc study 2017 Study Highlights    Nuclear stress EF: 47%.  This is a low risk study.   The left ventricular ejection fraction is mildly decreased (45-54%).   Breast attenuation no ischemia or infarct EF 47% looks normal suggest echo correlation      ASSESSMENT AND PLAN:  1.  HTN improved with higher dose ACE did not take meds this am repeat BP by me 130 mmHg/80 mmHg  2. HLD continue statin patient declined zetia in past   3.  CAD non obstructive on cath in 2007, continue statin.   Neg nuc study in 2017 and echo stable.  4.  Dementia on namenda  5. DM-2 Discussed low carb diet.  Target hemoglobin A1c is 6.5 or less.  Continue current medications.  6. Stroke:  She needs to f/u with primary and neurology Not totally clear to me why she got TPA with normal CT scan  Post TPA MRI no acute abnormalities and CTA with no large vessel carotid disease      F/U with cardiology in a year  Jenkins Rouge

## 2019-10-27 ENCOUNTER — Ambulatory Visit: Payer: Medicare Other | Admitting: Cardiovascular Disease

## 2019-10-28 ENCOUNTER — Ambulatory Visit: Payer: Medicare Other | Attending: Internal Medicine | Admitting: Internal Medicine

## 2019-10-28 ENCOUNTER — Other Ambulatory Visit: Payer: Self-pay

## 2019-10-28 DIAGNOSIS — I10 Essential (primary) hypertension: Secondary | ICD-10-CM

## 2019-10-28 DIAGNOSIS — Z8673 Personal history of transient ischemic attack (TIA), and cerebral infarction without residual deficits: Secondary | ICD-10-CM

## 2019-10-28 DIAGNOSIS — E785 Hyperlipidemia, unspecified: Secondary | ICD-10-CM

## 2019-10-28 DIAGNOSIS — R42 Dizziness and giddiness: Secondary | ICD-10-CM

## 2019-10-28 DIAGNOSIS — Z09 Encounter for follow-up examination after completed treatment for conditions other than malignant neoplasm: Secondary | ICD-10-CM | POA: Diagnosis not present

## 2019-10-28 NOTE — Progress Notes (Signed)
Pt states she is having some ringing in her left ear

## 2019-10-28 NOTE — Progress Notes (Signed)
Virtual Visit via Telephone Note All appointments today were changed to telephone visits due to icy weather conditions and a half facility being closed because of it.    I connected with Wendy Petty on 10/28/19 at 12:04 p.m by telephone and verified that I am speaking with the correct person using two identifiers. I am at home.  The patient is at home.  Only the patient, her daughter [name],  myself and [place] [phone]) participated in this encounter.  Connection was very bad using the voice over Internet phone calling  I discussed the limitations, risks, security and privacy concerns of performing an evaluation and management service by telephone and the availability of in person appointments. I also discussed with the patient that there may be a patient responsible charge related to this service. The patient expressed understanding and agreed to proceed.   History of Present Illness: Hx ofHTN, DM,CKD 3,hypothyroid, GERD, coronary atherosclerosis, HL,lacunar CVA,OA back, hip and knees.  Last seen about 1 mth ago.   Purpose of today's visit is hosp follow  Pt hosp 2/4-02/2020 at [facility] with confusion, vomiting and generalized weakness.  Assessed to be having an acute cerebral vascular event and was given tPN and admitted to neuro ICU.  I have copied notes from her subsequent outpatient visit with their  [facility] which summaries her hosp course: She completed a CT of the head with no acute abnormality. MRI of the brain was completed and showed no evidence of acute intracranial abnormality but showed old lacunar infarcts in the bilateral corona radiata and right basal cannula. She was given IV TPA and transferred to [facility]. Repeat CT of the head was negative for hemorrhage. CT angiogram of the head neck showed no carotid stenosis vertebral or intracranial stenosis.  EKG with normal sinus rhythm. Echo with bubble study showed LVEF of  51% and no interatrial shunt. Was previously on aspirin 81 mg and Plavix 75 mg at home. Dr. Sherryle Lis reviewed the chart and with no evidence of stenosis or other reasons for Plavix, Plavix was stopped and aspirin was increased to 325 mg. Today, she was unaware of these changes and was taking aspirin 325 mg and Plavix 75 mg. LDL was 107 and triglycerides were 143 she is on increased dose of atorvastatin 80 mg daily. She was only taking Lipitor 20 mg at home. Per review of the chart indicated that she was taking 40. But the patient was only taking 20 mg. Hemoglobin A1c was 7.2 she is on a low-carb diet. She follows up with her PCP regularly and is currently on Metformin and tolerating this well. Blood sugars have been stable since discharge. Blood pressures have been stable per her daughter on current new blood pressure regimen.  Since hospital discharge patient was seen in the emergency room again on 10/24/2019 with acute dizziness weakness and nausea.  Creatinine was 1.26, BUN 29.  Troponin normal.  Coagulation studies normal.  CT of the head revealed no acute abnormalities.  She was assessed to have vertigo.  Prescribed meclizine.  Today:  Patient reports compliance with having discontinued the Plavix and now taking a higher dose of aspirin which is to 325 mg daily.  She is also taking the higher dose of Lipitor 80 mg daily.  Reports compliance and was verbally able to confirm her blood pressure medications.  She is doing better but still having some ringing in the left ear that was associated with the acute dizziness  that sent her to the emergency room.  She would like to see a specialist for this.  She also requests to be referred to neurology in [place] on [address] so that she does not have to go back to [place].   Current Outpatient Medications  Medication Instructions  . acetaminophen (TYLENOL 8 HOUR) 650 mg, Oral, Every 8 hours PRN  . atorvastatin (LIPITOR) 20 mg, Oral, Daily at  bedtime  . carvedilol (COREG) 25 mg, Oral, 2 times daily with meals  . chlorthalidone (HYGROTON) 12.5 mg, Oral, Daily, Stop hydrochlorothiazide.  . clopidogrel (PLAVIX) 75 mg, Oral, Daily  . clotrimazole-betamethasone (LOTRISONE) cream APPLY EXTERNALLY TO THE AFFECTED AREA TWICE DAILY  . diclofenac Sodium (VOLTAREN) 1 % GEL APPLY 2 GRAMS EXTERNALLY TO THE AFFECTED AREA FOUR TIMES DAILY  . diclofenac Sodium (VOLTAREN) 2 g, Topical, 4 times daily  . fluticasone (FLONASE) 50 MCG/ACT nasal spray 1-2 sprays, Each Nare, Daily  . HYDROcodone-acetaminophen (NORCO) 5-325 MG tablet 1 tablet, Oral, 3 times daily PRN  . levothyroxine (SYNTHROID) 25 MCG tablet TAKE 1 TABLET(25 MCG) BY MOUTH DAILY  . lisinopril (ZESTRIL) 20 MG tablet TAKE 1 TABLET (20 MG TOTAL) BY MOUTH DAILY. DOSE INCREASE  . loratadine (CLARITIN) 10 mg, Oral, Daily  . memantine (NAMENDA) 10 mg, Oral, Daily  . metFORMIN (GLUCOPHAGE) 250 mg, Oral, Daily with breakfast  . nitroGLYCERIN (NITROSTAT) 0.4 mg, Sublingual, Every 5 min PRN  . omeprazole (PRILOSEC) 20 MG capsule TAKE 1 CAPSULE(20 MG) BY MOUTH DAILY  . ondansetron (ZOFRAN) 4 mg, Oral, Every 8 hours PRN  . triamcinolone cream (KENALOG) 0.1 % APPLY EXTERNALLY TO THE AFFECTED AREA TWICE DAILY      Observations/Objective: No direct observation done as this was a telephone encounter.  Assessment and Plan: 1. Hospital discharge follow-up 2. Status post CVA -I have submitted the referral for her to see a neurologist here in [place]. She will continue aspirin, high-dose Lipitor and current blood pressure medications with goal of blood pressure being 130/80 or lower. Her daughter will stop by the office next week to pick up a prescription for her to get a blood pressure monitoring device. - Ambulatory referral to Neurology  3. Vertigo Possible acute labyrinthitis vs Meniere's  - Ambulatory referral to Neurology - Ambulatory referral to ENT  4. Essential hypertension See plan  #1 above.  5. Hyperlipidemia, unspecified hyperlipidemia type Continue Lipitor   Follow Up Instructions: 2 mths or sooner if symptoms persist.   I discussed the assessment and treatment plan with the patient. The patient was provided an opportunity to ask questions and all were answered. The patient agreed with the plan and demonstrated an understanding of the instructions.   The patient was advised to call back or seek an in-person evaluation if the symptoms worsen or if the condition fails to improve as anticipated.  I provided 24 minutes of non-face-to-face time during this encounter.   Karle Plumber, MD

## 2019-10-31 ENCOUNTER — Ambulatory Visit: Payer: Medicare Other

## 2019-11-10 ENCOUNTER — Ambulatory Visit: Payer: Medicare Other | Attending: Internal Medicine

## 2019-11-10 DIAGNOSIS — Z20822 Contact with and (suspected) exposure to covid-19: Secondary | ICD-10-CM

## 2019-11-11 LAB — NOVEL CORONAVIRUS, NAA: SARS-CoV-2, NAA: NOT DETECTED

## 2019-11-12 ENCOUNTER — Ambulatory Visit: Payer: Medicare Other | Attending: Internal Medicine

## 2019-11-12 DIAGNOSIS — Z20822 Contact with and (suspected) exposure to covid-19: Secondary | ICD-10-CM

## 2019-11-13 LAB — NOVEL CORONAVIRUS, NAA: SARS-CoV-2, NAA: NOT DETECTED

## 2019-11-17 ENCOUNTER — Encounter: Payer: Self-pay | Admitting: Internal Medicine

## 2019-11-17 DIAGNOSIS — H8109 Meniere's disease, unspecified ear: Secondary | ICD-10-CM | POA: Insufficient documentation

## 2019-11-17 NOTE — Progress Notes (Signed)
I received a note from ENT specialist Dr. Arville Care with [facility].  Patient seen 11/10/2019.  His assessment was that patient symptoms are consistent with Mnire's disease which would explain all of the other problems she has had including the loss of hearing.  He recommended low-salt diet, avoid caffeine, keep a log of symptoms and if the vertigo attacks are starting to happen more frequently then he will consider transtympanic injection

## 2019-12-06 ENCOUNTER — Ambulatory Visit: Payer: Medicare Other | Admitting: Diagnostic Neuroimaging

## 2020-01-03 ENCOUNTER — Ambulatory Visit: Payer: Medicare Other | Admitting: Internal Medicine

## 2020-01-11 ENCOUNTER — Other Ambulatory Visit: Payer: Self-pay | Admitting: Internal Medicine

## 2020-01-11 DIAGNOSIS — E038 Other specified hypothyroidism: Secondary | ICD-10-CM

## 2020-01-12 ENCOUNTER — Other Ambulatory Visit: Payer: Self-pay | Admitting: Internal Medicine

## 2020-01-12 DIAGNOSIS — I10 Essential (primary) hypertension: Secondary | ICD-10-CM

## 2020-01-17 NOTE — Progress Notes (Signed)
Wendy Petty - 79 y.o. female MRN [id number]  Date of birth: 05/28/1941  Office Visit Note: Visit Date: 09/29/2019 PCP: Wendy Pier, MD Referred by: Wendy Pier, MD  Subjective: Chief Complaint  Patient presents with  . Lower Back - Pain  . Right Thigh - Pain  . Left Thigh - Pain   HPI:  Wendy Petty is a 79 y.o. female who comes in today At the request of Dr. Eduard Petty for planned Bilateral S1-2 lumbar transforaminal epidural steroid injection with fluoroscopic guidance.  The patient has failed conservative care including home exercise, medications, time and activity modification.  This injection will be diagnostic and hopefully therapeutic.  Please see requesting physician notes for further details and justification.  MRI reviewed with images and spine model.  MRI reviewed in the note below.   ROS Otherwise per HPI.  Assessment & Plan: Visit Diagnoses:  1. Lumbar radiculopathy   2. Spondylolisthesis of lumbar region     Plan: No additional findings.   Meds & Orders:  Meds ordered this encounter  Medications  . methylPREDNISolone acetate (DEPO-MEDROL) injection 40 mg    Orders Placed This Encounter  Procedures  . XR C-ARM NO REPORT  . Epidural Steroid injection    Follow-up: No follow-ups on file.   Procedures: No procedures performed  S1 Lumbosacral Transforaminal Epidural Steroid Injection - Sub-Pedicular Approach with Fluoroscopic Guidance   Patient: Wendy Petty      Date of Birth: [birth year] MRN: [id number] PCP: Wendy Pier, MD      Visit Date: 09/29/2019   Universal Protocol:    Date/Time: 05/10/216:13 AM  Consent Given By: the patient  Position:  PRONE  Additional Comments: Vital signs were monitored before and after the procedure. Patient was prepped and draped in the usual sterile fashion. The correct patient, procedure, and site was verified.   Injection Procedure Details:  Procedure Site  One Meds Administered:  Meds ordered this encounter  Medications  . methylPREDNISolone acetate (DEPO-MEDROL) injection 40 mg    Laterality: Bilateral  Location/Site:  S1 Foramen   Needle size: 22 ga.  Needle type: Spinal  Needle Placement: Transforaminal  Findings:   -Comments: Excellent flow of contrast along the nerve and into the epidural space.  Epidurogram: Contrast epidurogram showed no nerve root cut off or restricted flow pattern.  Procedure Details: After squaring off the sacral end-plate to get a true AP view, the C-arm was positioned so that the best possible view of the S1 foramen was visualized. The soft tissues overlying this structure were infiltrated with 2-3 ml. of 1% Lidocaine without Epinephrine.    The spinal needle was inserted toward the target using a "trajectory" view along the fluoroscope beam.  Under AP and lateral visualization, the needle was advanced so it did not puncture dura. Biplanar projections were used to confirm position. Aspiration was confirmed to be negative for CSF and/or blood. A 1-2 ml. volume of Isovue-250 was injected and flow of contrast was noted at each level. Radiographs were obtained for documentation purposes.   After attaining the desired flow of contrast documented above, a 0.5 to 1.0 ml test dose of 0.25% Marcaine was injected into each respective transforaminal space.  The patient was observed for 90 seconds post injection.  After no sensory deficits were reported, and normal lower extremity motor function was noted,   the above injectate was administered so that equal amounts of the injectate were placed at each foramen (level) into  the transforaminal epidural space.   Additional Comments:  The patient tolerated the procedure well Dressing: Band-Aid with 2 x 2 sterile gauze    Post-procedure details: Patient was observed during the procedure. Post-procedure instructions were reviewed.  Patient left the clinic in stable  condition.     Clinical History: MRI LUMBAR SPINE WITHOUT CONTRAST  TECHNIQUE: Multiplanar, multisequence MR imaging of the lumbar spine was performed. No intravenous contrast was administered.  COMPARISON:  X-ray 07/15/2019, MRI 08/10/2019  FINDINGS: Segmentation:  Standard.  Alignment: 9 mm anterolisthesis L5 on S1 secondary to chronic bilateral L5 pars interarticularis defects.  Vertebrae: No fracture, evidence of discitis, or bone lesion. Chronic discogenic endplate marrow changes at L4-5 and L5-S1.  Conus medullaris and cauda equina: Conus extends to the T12-L1 level. Conus and cauda equina appear normal.  Paraspinal and other soft tissues: Cortically based T2 hyperintense lesions within the left kidney, incompletely characterized, but most likely represent cysts.  Disc levels:  T12-L1: No significant disc protrusion, foraminal stenosis, or canal stenosis.  L1-L2: Mild diffuse disc bulge. No canal stenosis. Mild bilateral foraminal stenosis.  L2-L3: Diffuse disc bulge results in impress upon the ventral CSF without canal stenosis. Right worse than left facet arthropathy. There is mild bilateral foraminal and subarticular recess stenosis.  L3-L4: Diffuse disc bulge, eccentric to the right with mild bilateral facet arthrosis resulting in moderate right and mild left foraminal stenosis with mild right subarticular recess stenosis. No canal stenosis.  L4-L5: Diffuse disc bulge with bilateral facet arthrosis, left worse than right resulting in severe left and mild-to-moderate right foraminal stenosis. No canal stenosis.  L5-S1: Disc uncovering with mild diffuse disc bulge and bilateral facet arthrosis resulting in severe right and moderate left foraminal stenosis without canal stenosis.  IMPRESSION: 1. Multilevel degenerative changes of the lumbar spine resulting in multilevel foraminal stenosis, worst at the left L4-5 and right L5-S1 levels. No  significant canal stenosis. 2. 9 mm anterolisthesis L5 on S1 secondary to chronic bilateral L5 pars interarticularis defects. 3. Cortically based T2 hyperintense lesions within the left kidney, incompletely characterized, but most likely represent cysts. Recommend renal ultrasound for further evaluation. This recommendation follows ACR consensus guidelines: White Paper of the ACR Incidental Findings Committee II on Vascular Findings. J Am Coll Radiol 2013; 10:789-794.   Electronically Signed   By: Davina Poke D.O.   On: 09/22/2019 09:50     Objective:  VS:  HT:    WT:   BMI:     BP:(!) 161/80  HR:70bpm  TEMP: ( )  RESP:  Physical Exam Constitutional:      General: She is not in acute distress.    Appearance: Normal appearance. She is not ill-appearing.  HENT:     Head: Normocephalic and atraumatic.     Right Ear: External ear normal.     Left Ear: External ear normal.  Eyes:     Extraocular Movements: Extraocular movements intact.  Cardiovascular:     Rate and Rhythm: Normal rate.     Pulses: Normal pulses.  Musculoskeletal:     Right lower leg: No edema.     Left lower leg: No edema.     Comments: Patient has good distal strength with no pain over the greater trochanters.  No clonus or focal weakness.  Skin:    Findings: No erythema, lesion or rash.  Neurological:     General: No focal deficit present.     Mental Status: She is alert and oriented to person, place,  and time.     Sensory: No sensory deficit.     Motor: No weakness or abnormal muscle tone.     Coordination: Coordination normal.  Psychiatric:        Mood and Affect: Mood normal.        Behavior: Behavior normal.      Imaging: No results found.

## 2020-01-17 NOTE — Procedures (Signed)
S1 Lumbosacral Transforaminal Epidural Steroid Injection - Sub-Pedicular Approach with Fluoroscopic Guidance   Patient: Wendy Petty      Date of Birth: [birth year] MRN: [id number] PCP: [name], MD      Visit Date: 09/29/2019   Universal Protocol:    Date/Time: 05/10/216:13 AM  Consent Given By: the patient  Position:  PRONE  Additional Comments: Vital signs were monitored before and after the procedure. Patient was prepped and draped in the usual sterile fashion. The correct patient, procedure, and site was verified.   Injection Procedure Details:  Procedure Site One Meds Administered:  Meds ordered this encounter  Medications  . methylPREDNISolone acetate (DEPO-MEDROL) injection 40 mg    Laterality: Bilateral  Location/Site:  S1 Foramen   Needle size: 22 ga.  Needle type: Spinal  Needle Placement: Transforaminal  Findings:   -Comments: Excellent flow of contrast along the nerve and into the epidural space.  Epidurogram: Contrast epidurogram showed no nerve root cut off or restricted flow pattern.  Procedure Details: After squaring off the sacral end-plate to get a true AP view, the C-arm was positioned so that the best possible view of the S1 foramen was visualized. The soft tissues overlying this structure were infiltrated with 2-3 ml. of 1% Lidocaine without Epinephrine.    The spinal needle was inserted toward the target using a "trajectory" view along the fluoroscope beam.  Under AP and lateral visualization, the needle was advanced so it did not puncture dura. Biplanar projections were used to confirm position. Aspiration was confirmed to be negative for CSF and/or blood. A 1-2 ml. volume of Isovue-250 was injected and flow of contrast was noted at each level. Radiographs were obtained for documentation purposes.   After attaining the desired flow of contrast documented above, a 0.5 to 1.0 ml test dose of 0.25% Marcaine was injected into each  respective transforaminal space.  The patient was observed for 90 seconds post injection.  After no sensory deficits were reported, and normal lower extremity motor function was noted,   the above injectate was administered so that equal amounts of the injectate were placed at each foramen (level) into the transforaminal epidural space.   Additional Comments:  The patient tolerated the procedure well Dressing: Band-Aid with 2 x 2 sterile gauze    Post-procedure details: Patient was observed during the procedure. Post-procedure instructions were reviewed.  [name].

## 2020-02-01 ENCOUNTER — Telehealth: Payer: Self-pay | Admitting: Internal Medicine

## 2020-02-01 DIAGNOSIS — K219 Gastro-esophageal reflux disease without esophagitis: Secondary | ICD-10-CM

## 2020-02-01 DIAGNOSIS — I10 Essential (primary) hypertension: Secondary | ICD-10-CM

## 2020-02-01 DIAGNOSIS — E038 Other specified hypothyroidism: Secondary | ICD-10-CM

## 2020-02-01 NOTE — Telephone Encounter (Signed)
Patient called and requesting for listed medications to be refilled and sent to [facility] [address], [place]  [address], [place] [address]   atorvastatin (LIPITOR) 80 MG tablet [id number]  levothyroxine (SYNTHROID) 25 MCG tablet [id number]  lisinopril (ZESTRIL) 20 MG tablet [id number]  omeprazole (PRILOSEC) 20 MG capsule [id number]  ondansetron (ZOFRAN) 4 MG tablet [id number]

## 2020-02-02 MED ORDER — LISINOPRIL 20 MG PO TABS: ORAL_TABLET | ORAL | 3 refills | Status: AC

## 2020-02-02 MED ORDER — ONDANSETRON HCL 4 MG PO TABS: 4.0000 mg | ORAL_TABLET | Freq: Three times a day (TID) | ORAL | 0 refills | Status: DC | PRN

## 2020-02-02 MED ORDER — OMEPRAZOLE 20 MG PO CPDR: 20.0000 mg | DELAYED_RELEASE_CAPSULE | Freq: Every day | ORAL | 0 refills | Status: DC

## 2020-02-02 MED ORDER — LEVOTHYROXINE SODIUM 25 MCG PO TABS: ORAL_TABLET | ORAL | 12 refills | Status: AC

## 2020-02-02 MED ORDER — ATORVASTATIN CALCIUM 80 MG PO TABS: 80.0000 mg | ORAL_TABLET | Freq: Every day | ORAL | 0 refills | Status: AC

## 2020-02-02 NOTE — Telephone Encounter (Signed)
Patient needs an appointment. She was a no-show with Dr. Wynetta Emery in April. I have refilled a month's supply for her in the meantime.

## 2020-02-08 ENCOUNTER — Other Ambulatory Visit (HOSPITAL_COMMUNITY): Payer: Medicare Other

## 2020-02-25 ENCOUNTER — Telehealth (HOSPITAL_COMMUNITY): Payer: Self-pay | Admitting: Cardiovascular Disease

## 2020-02-25 NOTE — Telephone Encounter (Signed)
Patient cancelled Echocardiogram and did not wish to reschedule at this time per Scheduler on 02/08/2020.  Order will be removed from the [facility] and if patient calls back to reschedule we can reinstate the order.

## 2020-02-28 ENCOUNTER — Other Ambulatory Visit: Payer: Self-pay | Admitting: Internal Medicine

## 2020-03-17 ENCOUNTER — Other Ambulatory Visit: Payer: Self-pay | Admitting: Internal Medicine

## 2020-03-17 NOTE — Telephone Encounter (Signed)
Requested medication (s) are due for refill today -yes  Requested medication (s) are on the active medication list -yes  Future visit scheduled -no  Last refill: 02/02/20  Notes to clinic: Last RF sent #30 with must have appointment- no appointment scheduled. Sent for review of RF request  Requested Prescriptions  Pending Prescriptions Disp Refills   atorvastatin (LIPITOR) 80 MG tablet [Pharmacy Med Name: ATORVASTATIN 80 MG TABLET] 30 tablet 0    Sig: TAKE ONE TABLET BY MOUTH DAILY. MUST HAVE OFFICE VISIT.      Cardiovascular:  Antilipid - Statins Failed - 03/17/2020  3:39 PM      Failed - Total Cholesterol in normal range and within 360 days    Cholesterol, Total  Date Value Ref Range Status  06/18/2018 145 100 - 199 mg/dL Final          Failed - LDL in normal range and within 360 days    LDL Calculated  Date Value Ref Range Status  06/18/2018 69 0 - 99 mg/dL Final          Failed - HDL in normal range and within 360 days    HDL  Date Value Ref Range Status  06/18/2018 36 (L) >39 mg/dL Final          Failed - Triglycerides in normal range and within 360 days    Triglycerides  Date Value Ref Range Status  06/18/2018 201 (H) 0 - 149 mg/dL Final          Passed - Patient is not pregnant      Passed - Valid encounter within last 12 months    Recent Outpatient Visits           4 months ago Hospital discharge follow-up   [place], Deborah B, MD   5 months ago Primary osteoarthritis of both knees   [place], Deborah B, MD   7 months ago Primary osteoarthritis involving multiple joints   [place] [name], MD   9 months ago Contusion of chest wall, unspecified laterality, initial encounter   [place], [place], [place]   1 year ago Hip pain, bilateral   [place], MD                  Requested Prescriptions  Pending Prescriptions Disp Refills   atorvastatin (LIPITOR) 80 MG tablet [Pharmacy Med Name: ATORVASTATIN 80 MG TABLET] 30 tablet 0    Sig: TAKE ONE TABLET BY MOUTH DAILY. MUST HAVE OFFICE VISIT.      Cardiovascular:  Antilipid - Statins Failed - 03/17/2020  3:39 PM      Failed - Total Cholesterol in normal range and within 360 days    Cholesterol, Total  Date Value Ref Range Status  06/18/2018 145 100 - 199 mg/dL Final          Failed - LDL in normal range and within 360 days    LDL Calculated  Date Value Ref Range Status  06/18/2018 69 0 - 99 mg/dL Final          Failed - HDL in normal range and within 360 days    HDL  Date Value Ref Range Status  06/18/2018 36 (L) >39 mg/dL Final          Failed - Triglycerides in normal range and  within 360 days    Triglycerides  Date Value Ref Range Status  06/18/2018 201 (H) 0 - 149 mg/dL Final          Passed - Patient is not pregnant      Passed - Valid encounter within last 12 months    Recent Outpatient Visits           4 months ago Hospital discharge follow-up   [place] [name], MD   5 months ago Primary osteoarthritis of both [place], Deborah B, MD   7 months ago Primary osteoarthritis involving multiple joints   [place], MD   9 months ago Contusion of chest wall, unspecified laterality, initial encounter   [place], [place]   1 year ago Hip pain, bilateral   [place] [name], MD

## 2020-03-30 ENCOUNTER — Other Ambulatory Visit: Payer: Self-pay | Admitting: Internal Medicine

## 2020-03-30 ENCOUNTER — Other Ambulatory Visit: Payer: Self-pay | Admitting: Physician Assistant

## 2020-03-30 DIAGNOSIS — I10 Essential (primary) hypertension: Secondary | ICD-10-CM

## 2020-03-30 DIAGNOSIS — E785 Hyperlipidemia, unspecified: Secondary | ICD-10-CM

## 2020-03-30 DIAGNOSIS — K219 Gastro-esophageal reflux disease without esophagitis: Secondary | ICD-10-CM

## 2020-03-30 DIAGNOSIS — R413 Other amnesia: Secondary | ICD-10-CM

## 2020-03-30 NOTE — Telephone Encounter (Signed)
Requested medication (s) are due for refill today: no  Requested medication (s) are on the active medication list: yes  Last refill:  01/01/2020  Future visit scheduled: no  Notes to clinic:  patient no showed last appointment and has not reschedule  Requested Prescriptions  Pending Prescriptions Disp Refills   carvedilol (COREG) 25 MG tablet [Pharmacy Med Name: CARVEDILOL 25MG  TABLETS] 180 tablet 3    Sig: TAKE 1 TABLET BY MOUTH TWICE DAILY WITH FOOD      Cardiovascular:  Beta Blockers Failed - 03/30/2020  7:26 AM      Failed - Last BP in normal range    BP Readings from Last 1 Encounters:  10/01/19 (!) 195/79          Passed - Last Heart Rate in normal range    Pulse Readings from Last 1 Encounters:  10/01/19 64          Passed - Valid encounter within last 6 months    Recent Outpatient Visits           5 months ago Hospital discharge follow-up   [place] [name], MD   6 months ago Primary osteoarthritis of both knees   [place], Deborah B, MD   8 months ago Primary osteoarthritis involving multiple joints   [place], MD   9 months ago Contusion of chest wall, unspecified laterality, initial encounter   [place], [place]   1 year ago Hip pain, bilateral   [place] [name], MD

## 2020-03-30 NOTE — Telephone Encounter (Signed)
Requested  medications are  due for refill today yes  Requested medications are on the active medication list yes  Last refill 4/24  Future visit scheduled no  Notes to clinic Already had a curtesy refill, no visit is scheduled.

## 2020-05-16 ENCOUNTER — Other Ambulatory Visit: Payer: Self-pay | Admitting: Physician Assistant

## 2020-05-16 DIAGNOSIS — I10 Essential (primary) hypertension: Secondary | ICD-10-CM

## 2020-05-16 NOTE — Telephone Encounter (Signed)
Requested medication (s) are due for refill today: yes   Requested medication (s) are on the active medication list: yes   Last refill:  02/10/2020  Future visit scheduled: no  Notes to clinic:  overdue for follow up    Requested Prescriptions  Pending Prescriptions Disp Refills   lisinopril (ZESTRIL) 20 MG tablet [Pharmacy Med Name: LISINOPRIL 20MG  TABLETS] 90 tablet 3    Sig: TAKE 1 TABLET(20 MG) BY MOUTH DAILY      Cardiovascular:  ACE Inhibitors Failed - 05/16/2020  8:07 AM      Failed - Cr in normal range and within 180 days    Creat  Date Value Ref Range Status  05/21/2016 0.87 0.60 - 0.93 mg/dL Final    Comment:      For patients > or = 78 years of age: The upper reference limit for Creatinine is approximately 13% higher for people identified as African-American.      Creatinine, Ser  Date Value Ref Range Status  10/01/2019 1.25 (H) 0.57 - 1.00 mg/dL Final   Creatinine, Urine  Date Value Ref Range Status  07/26/2016 167 20 - 320 mg/dL Final          Failed - K in normal range and within 180 days    Potassium  Date Value Ref Range Status  10/01/2019 4.2 3.5 - 5.2 mmol/L Final          Failed - Last BP in normal range    BP Readings from Last 1 Encounters:  10/01/19 (!) 195/79          Failed - Valid encounter within last 6 months    Recent Outpatient Visits           6 months ago Hospital discharge follow-up   [place] [name], MD   7 months ago Primary osteoarthritis of both knees   [place], [place]   9 months ago Primary osteoarthritis involving multiple joints   [place], MD   11 months ago Contusion of chest wall, unspecified laterality, initial encounter   [place], [place]   1 year ago Hip pain, bilateral   [place], MD              Passed - Patient is not pregnant

## 2020-06-04 ENCOUNTER — Other Ambulatory Visit: Payer: Self-pay | Admitting: Internal Medicine

## 2020-06-04 DIAGNOSIS — K219 Gastro-esophageal reflux disease without esophagitis: Secondary | ICD-10-CM

## 2020-07-01 ENCOUNTER — Other Ambulatory Visit: Payer: Self-pay | Admitting: Internal Medicine

## 2020-07-01 DIAGNOSIS — R413 Other amnesia: Secondary | ICD-10-CM

## 2020-07-01 NOTE — Telephone Encounter (Signed)
Requested medications are due for refill today yes  Requested medications are on the active medication list yes  Last refill 9/26  Last visit Jan 2021  Future visit scheduled no  Notes to clinic Failed protocol of valid visit within 6 months

## 2020-07-03 NOTE — Telephone Encounter (Signed)
Not our patient

## 2020-07-05 NOTE — Telephone Encounter (Signed)
Requested medications are due for refill today yes  Requested medications are on the active medication list yes  Last refill 9/26  Last visit 12/2019  Future visit scheduled no  Notes to clinic Failed protocol of visit within 6 months

## 2020-08-09 ENCOUNTER — Other Ambulatory Visit: Payer: Self-pay | Admitting: Internal Medicine

## 2020-08-09 DIAGNOSIS — K219 Gastro-esophageal reflux disease without esophagitis: Secondary | ICD-10-CM

## 2020-08-09 MED ORDER — OMEPRAZOLE 20 MG PO CPDR: 20.0000 mg | DELAYED_RELEASE_CAPSULE | Freq: Every day | ORAL | 1 refills | Status: AC

## 2020-08-09 NOTE — Telephone Encounter (Signed)
Medication Refill - Medication: omeprazole (PRILOSEC) 20 MG capsule     Preferred Pharmacy (with phone number or street name):  [facility] [address] [place] Phone:  [phone]  Fax:  [phone]       Agent: Please be advised that RX refills may take up to 3 business days. We ask that you follow-up with your pharmacy.

## 2020-08-18 ENCOUNTER — Encounter (HOSPITAL_COMMUNITY): Payer: Self-pay | Admitting: Emergency Medicine

## 2020-08-18 ENCOUNTER — Other Ambulatory Visit: Payer: Self-pay

## 2020-08-18 ENCOUNTER — Emergency Department (HOSPITAL_COMMUNITY): Payer: Medicare Other

## 2020-08-18 ENCOUNTER — Emergency Department (HOSPITAL_COMMUNITY)
Admission: EM | Admit: 2020-08-18 | Discharge: 2020-08-18 | Discharge status: Against Medical Advice | Payer: Medicare Other

## 2020-08-18 ENCOUNTER — Emergency Department (HOSPITAL_COMMUNITY)
Admission: EM | Admit: 2020-08-18 | Discharge: 2020-08-19 | Discharge status: Against Medical Advice | Payer: Medicare Other | Attending: Emergency Medicine | Admitting: Emergency Medicine

## 2020-08-18 DIAGNOSIS — I2510 Atherosclerotic heart disease of native coronary artery without angina pectoris: Secondary | ICD-10-CM | POA: Insufficient documentation

## 2020-08-18 DIAGNOSIS — Z79899 Other long term (current) drug therapy: Secondary | ICD-10-CM | POA: Insufficient documentation

## 2020-08-18 DIAGNOSIS — Z955 Presence of coronary angioplasty implant and graft: Secondary | ICD-10-CM | POA: Insufficient documentation

## 2020-08-18 DIAGNOSIS — R0602 Shortness of breath: Secondary | ICD-10-CM | POA: Diagnosis present

## 2020-08-18 DIAGNOSIS — Z7984 Long term (current) use of oral hypoglycemic drugs: Secondary | ICD-10-CM | POA: Diagnosis not present

## 2020-08-18 DIAGNOSIS — Z7982 Long term (current) use of aspirin: Secondary | ICD-10-CM | POA: Diagnosis not present

## 2020-08-18 DIAGNOSIS — E871 Hypo-osmolality and hyponatremia: Secondary | ICD-10-CM | POA: Insufficient documentation

## 2020-08-18 DIAGNOSIS — E1121 Type 2 diabetes mellitus with diabetic nephropathy: Secondary | ICD-10-CM | POA: Diagnosis not present

## 2020-08-18 DIAGNOSIS — R0902 Hypoxemia: Secondary | ICD-10-CM

## 2020-08-18 DIAGNOSIS — Z23 Encounter for immunization: Secondary | ICD-10-CM | POA: Diagnosis not present

## 2020-08-18 DIAGNOSIS — U071 COVID-19: Secondary | ICD-10-CM

## 2020-08-18 DIAGNOSIS — E039 Hypothyroidism, unspecified: Secondary | ICD-10-CM | POA: Diagnosis not present

## 2020-08-18 DIAGNOSIS — I10 Essential (primary) hypertension: Secondary | ICD-10-CM | POA: Insufficient documentation

## 2020-08-18 LAB — BASIC METABOLIC PANEL
Anion gap: 12 (ref 5–15)
BUN: 34 mg/dL — ABNORMAL HIGH (ref 8–23)
CO2: 22 mmol/L (ref 22–32)
Calcium: 8.7 mg/dL — ABNORMAL LOW (ref 8.9–10.3)
Chloride: 93 mmol/L — ABNORMAL LOW (ref 98–111)
Creatinine, Ser: 1.38 mg/dL — ABNORMAL HIGH (ref 0.44–1.00)
GFR, Estimated: 39 mL/min — ABNORMAL LOW (ref >60)
Glucose, Bld: 236 mg/dL — ABNORMAL HIGH (ref 70–99)
Potassium: 4.3 mmol/L (ref 3.5–5.1)
Sodium: 127 mmol/L — ABNORMAL LOW (ref 135–145)

## 2020-08-18 LAB — CBC
HCT: 37.9 % (ref 36.0–46.0)
Hemoglobin: 12.2 g/dL (ref 12.0–15.0)
MCH: 30.1 pg (ref 26.0–34.0)
MCHC: 32.2 g/dL (ref 30.0–36.0)
MCV: 93.6 fL (ref 80.0–100.0)
Platelets: 180 10*3/uL (ref 150–400)
RBC: 4.05 MIL/uL (ref 3.87–5.11)
RDW: 12.3 % (ref 11.5–15.5)
WBC: 8.8 10*3/uL (ref 4.0–10.5)
nRBC: 0 % (ref 0.0–0.2)

## 2020-08-18 LAB — TROPONIN I (HIGH SENSITIVITY): Troponin I (High Sensitivity): 8 ng/L (ref <18)

## 2020-08-18 LAB — PROTIME-INR
INR: 1 (ref 0.8–1.2)
Prothrombin Time: 13 seconds (ref 11.4–15.2)

## 2020-08-18 NOTE — ED Triage Notes (Signed)
Patient reports SOB with chest tightness this week , occasional dry cough , denies emesis or fever. Tested negative for Covid19 today .

## 2020-08-19 DIAGNOSIS — Z23 Encounter for immunization: Secondary | ICD-10-CM | POA: Diagnosis not present

## 2020-08-19 DIAGNOSIS — R0602 Shortness of breath: Secondary | ICD-10-CM | POA: Diagnosis present

## 2020-08-19 DIAGNOSIS — U071 COVID-19: Secondary | ICD-10-CM | POA: Diagnosis not present

## 2020-08-19 LAB — TROPONIN I (HIGH SENSITIVITY): Troponin I (High Sensitivity): 8 ng/L (ref <18)

## 2020-08-19 LAB — RESP PANEL BY RT-PCR (FLU A&B, COVID) ARPGX2
Influenza A by PCR: NEGATIVE
Influenza B by PCR: NEGATIVE
SARS Coronavirus 2 by RT PCR: POSITIVE — AB

## 2020-08-19 MED ORDER — SODIUM CHLORIDE 0.9 % IV SOLN: 100.0000 mg | Freq: Every day | INTRAVENOUS | Status: DC

## 2020-08-19 MED ORDER — EPINEPHRINE 0.3 MG/0.3ML IJ SOAJ: 0.3000 mg | Freq: Once | INTRAMUSCULAR | Status: DC | PRN

## 2020-08-19 MED ORDER — DIPHENHYDRAMINE HCL 50 MG/ML IJ SOLN: 50.0000 mg | Freq: Once | INTRAMUSCULAR | Status: DC | PRN

## 2020-08-19 MED ORDER — SODIUM CHLORIDE 0.9 % IV SOLN: INTRAVENOUS | Status: DC | PRN

## 2020-08-19 MED ORDER — SODIUM CHLORIDE 0.9 % IV BOLUS: 500.0000 mL | Freq: Once | INTRAVENOUS | Status: DC

## 2020-08-19 MED ORDER — SODIUM CHLORIDE 0.9 % IV SOLN
200.0000 mg | Freq: Once | INTRAVENOUS | Status: AC
  Administered 2020-08-19: 12:00:00 200 mg via INTRAVENOUS
  Filled 2020-08-19: qty 40

## 2020-08-19 MED ORDER — SODIUM CHLORIDE 0.9 % IV SOLN: 1200.0000 mg | Freq: Once | INTRAVENOUS | Status: DC

## 2020-08-19 MED ORDER — BLOOD GLUCOSE MONITOR KIT: PACK | 0 refills | Status: AC

## 2020-08-19 MED ORDER — FAMOTIDINE IN NACL 20-0.9 MG/50ML-% IV SOLN
20.0000 mg | Freq: Once | INTRAVENOUS | Status: DC | PRN
  Filled 2020-08-19: qty 50

## 2020-08-19 MED ORDER — ACETAMINOPHEN 500 MG PO TABS
1000.0000 mg | ORAL_TABLET | Freq: Once | ORAL | Status: AC
  Administered 2020-08-19: 17:00:00 1000 mg via ORAL
  Filled 2020-08-19: qty 2

## 2020-08-19 MED ORDER — METHYLPREDNISOLONE SODIUM SUCC 125 MG IJ SOLR: 125.0000 mg | Freq: Once | INTRAMUSCULAR | Status: DC | PRN

## 2020-08-19 MED ORDER — ALBUTEROL SULFATE HFA 108 (90 BASE) MCG/ACT IN AERS: 2.0000 | INHALATION_SPRAY | Freq: Once | RESPIRATORY_TRACT | Status: DC | PRN

## 2020-08-19 MED ORDER — INSULIN LISPRO (1 UNIT DIAL) 100 UNIT/ML (KWIKPEN): 8.0000 [IU] | PEN_INJECTOR | Freq: Three times a day (TID) | SUBCUTANEOUS | 0 refills | Status: AC

## 2020-08-19 MED ORDER — SODIUM CHLORIDE 0.9 % IV SOLN
Freq: Once | INTRAVENOUS | Status: AC
  Filled 2020-08-19: qty 20

## 2020-08-19 MED ORDER — SODIUM CHLORIDE 0.9 % IV BOLUS
1000.0000 mL | Freq: Once | INTRAVENOUS | Status: AC
  Administered 2020-08-19: 06:00:00 1000 mL via INTRAVENOUS

## 2020-08-19 MED ORDER — PREDNISONE 20 MG PO TABS
40.0000 mg | ORAL_TABLET | Freq: Every day | ORAL | 0 refills | Status: AC
Start: 2020-08-19 — End: 2020-08-28

## 2020-08-19 NOTE — ED Provider Notes (Signed)
[facility] EMERGENCY DEPARTMENT Provider Note   CSN: [id number] Arrival date & time: 08/18/20  1930     History Chief Complaint  Patient presents with  . Shortness of Breath    Wendy Petty is a 79 y.o. female.  The history is provided by the patient, medical records and a relative. The history is limited by a language barrier. A language interpreter was used.  Shortness of Breath Severity:  Severe Onset quality:  Gradual Duration:  3 days Timing:  Constant Progression:  Waxing and waning Chronicity:  New Context: URI   Relieved by:  Nothing Worsened by:  Coughing Ineffective treatments:  None tried Associated symptoms: chest pain (tightness) and cough   Associated symptoms: no abdominal pain, no diaphoresis, no fever, no headaches, no hemoptysis, no vomiting and no wheezing        Past Medical History:  Diagnosis Date  . ANEMIA, CHRONIC   . 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 Breast mass, right 11/23/2015  . CHEST PAIN UNSPECIFIED   . Coronary atherosclerosis of native coronary artery 06/24/2013  . Depression 12/31/2012  . Diabetes mellitus without complication ([place]) 06/2584  . Edema   . GERD (gastroesophageal reflux disease)   . History of TIAs 06/24/2013  . Hx of cardiovascular stress test    Lexiscan Myoview (11/14):  No ischemia, EF 68% (normal study)  . HYPERLIPIDEMIA   . Hypertension   . Hypothyroidism 09/29/2015  . Memory deficit 11/28/2014  . Primary osteoarthritis of both knees 09/29/2015  . PULMONARY NODULE   . THYROID NODULE   . Type 2 diabetes mellitus with microalbuminuria ([place]) 09/29/2015    Patient Active Problem List   Diagnosis Date Noted  . Meniere's disease 11/17/2019  . Primary osteoarthritis of right knee 07/15/2019  . Low back pain 07/15/2019  . Primary osteoarthritis of left knee 06/01/2019  . Seasonal allergies 11/09/2018  . Nocturnal muscle cramp 11/09/2018  . Breast mass, right 11/23/2015   . 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 Ear noise/buzzing 10/30/2015  . Chronic low back pain 10/30/2015  . Type 2 diabetes mellitus with microalbuminuria ([place]) 09/29/2015  . Poorly fitting dentures 09/29/2015  . Hypothyroidism 09/29/2015  . Primary osteoarthritis of both knees 09/29/2015  . Spondylolisthesis 07/19/2015  . Overweight (BMI 25.0-29.9) 06/29/2015  . Memory deficit 11/28/2014  . Coronary atherosclerosis of native coronary artery 06/24/2013  . History of TIAs 06/24/2013  . GERD (gastroesophageal reflux disease) 06/24/2013  . Depression 12/31/2012  . THYROID NODULE 02/14/2009  . Elevated lipids 02/14/2009  . ANEMIA, CHRONIC 02/14/2009  . Essential hypertension 02/14/2009  . PULMONARY NODULE 02/14/2009    Past Surgical History:  Procedure Laterality Date  . LEFT HEART CATHETERIZATION WITH CORONARY ANGIOGRAM N/A 09/16/2011   Procedure: LEFT HEART CATHETERIZATION WITH CORONARY ANGIOGRAM;  Surgeon: [name], MD;  Location: [facility] CATH LAB;  Service: Cardiovascular;  Laterality: N/A;  . TUBAL LIGATION    . VAGINAL HYSTERECTOMY  1989     OB History   No obstetric history on file.     Family History  Problem Relation Age of Onset  . Heart disease Mother   . Cancer Father        a tumor ruptured in his abd.    Social History   Tobacco Use  . Smoking status: Never Smoker  . Smokeless tobacco: Never Used  Vaping Use  . Vaping Use: Never used  Substance Use Topics  . Alcohol use:  No    Alcohol/week: 0.0 standard drinks  . Drug use: No    Home Medications Prior to Admission medications   Medication Sig Start Date End Date Taking? Authorizing Provider  acetaminophen (TYLENOL 8 HOUR) 650 MG CR tablet Take 1 tablet (650 mg total) by mouth every 8 (eight) hours as needed for pain. 10/01/19   [name], MD  aspirin 325 MG tablet Take by mouth. 10/26/19 10/25/20  [provider]  atorvastatin (LIPITOR) 80 MG tablet Take 1 tablet (80  mg total) by mouth daily. Must have office visit. Was a no-show in April. 02/02/20   [name], MD  carvedilol (COREG) 25 MG tablet Take 1 tablet (25 mg total) by mouth 2 (two) times daily with a meal. 10/01/19   [name], MD  chlorthalidone (HYGROTON) 25 MG tablet Take 0.5 tablets (12.5 mg total) by mouth daily. Stop hydrochlorothiazide. 10/01/19   [name], MD  clotrimazole-betamethasone (LOTRISONE) cream APPLY EXTERNALLY TO THE AFFECTED AREA TWICE DAILY 08/31/19   [name], MD  diclofenac Sodium (VOLTAREN) 1 % GEL Apply 2 g topically 4 (four) times daily. 10/01/19   [name], MD  diclofenac Sodium (VOLTAREN) 1 % GEL APPLY 2 GRAMS EXTERNALLY TO THE AFFECTED AREA FOUR TIMES DAILY 10/04/19   [name], MD  fluticasone [facility]) 50 MCG/ACT nasal spray Place 1-2 sprays into both nostrils daily for 7 days. 07/15/19 07/22/19  Wieters, Hallie C, PA-C  HYDROcodone-acetaminophen (NORCO) 5-325 MG tablet Take 1 tablet by mouth 3 (three) times daily as needed for moderate pain. 09/23/19   Aundra Dubin, PA-C  levothyroxine (SYNTHROID) 25 MCG tablet TAKE 1 TABLET(25 MCG) BY MOUTH DAILY. Must have office visit. Was a no-show in April. 02/02/20   [name], MD  lisinopril (ZESTRIL) 20 MG tablet TAKE 1 TABLET (20 MG TOTAL) BY MOUTH DAILY. Must have office visit. Was a no-show in April. 02/02/20   [name], MD  loratadine (CLARITIN) 10 MG tablet Take 1 tablet (10 mg total) by mouth daily. 07/15/19   Wieters, Hallie C, PA-C  memantine (NAMENDA) 10 MG tablet TAKE 1 TABLET BY MOUTH DAILY 03/30/20   [name], MD  metFORMIN (GLUCOPHAGE) 500 MG tablet Take 0.5 tablets (250 mg total) by mouth daily with breakfast. 10/01/19   [name], MD  nitroGLYCERIN (NITROSTAT) 0.4 MG SL tablet Place 1 tablet (0.4 mg total) under the tongue every 5 (five) minutes as needed for chest pain. 06/20/17   [name], MD  omeprazole (PRILOSEC) 20 MG  capsule Take 1 capsule (20 mg total) by mouth daily. Must have office visit. Was a no-show in April. 08/09/20   [name], MD  ondansetron (ZOFRAN) 4 MG tablet Take 1 tablet (4 mg total) by mouth every 8 (eight) hours as needed for nausea or vomiting. Must have office visit. Was a no-show in April. 02/02/20   [name], MD  triamcinolone cream (KENALOG) 0.1 % APPLY EXTERNALLY TO THE AFFECTED AREA TWICE DAILY 08/31/19   [name], MD  Cetirizine HCl 10 MG CAPS Take 1 capsule (10 mg total) by mouth daily for 15 days. 07/15/19 07/15/19  Wieters, Hallie C, PA-C    Allergies    Hydrochlorothiazide  Review of Systems   Review of Systems  Constitutional: Positive for fatigue. Negative for chills, diaphoresis and fever.  HENT: Positive for congestion.   Respiratory: Positive for cough, chest tightness and shortness of breath.  Negative for hemoptysis, wheezing and stridor.   Cardiovascular: Positive for chest pain (tightness). Negative for palpitations and leg swelling.  Gastrointestinal: Negative for abdominal pain, constipation, diarrhea, nausea and vomiting.  Genitourinary: Negative for dysuria and flank pain.  Musculoskeletal: Negative for back pain.  Neurological: Negative for dizziness, weakness, light-headedness, numbness and headaches.  Psychiatric/Behavioral: Negative for agitation.  All other systems reviewed and are negative.   Physical Exam Updated Vital Signs BP (!) 141/61 (BP Location: Right Arm)   Pulse 88   Temp 99.2 F (37.3 C) (Oral)   Resp (!) 29   Ht 5' (1.524 m)   Wt 54.4 kg   SpO2 (!) 88%   BMI 23.44 kg/m   Physical Exam Vitals and nursing note reviewed.  Constitutional:      General: She is not in acute distress.    Appearance: She is well-developed and well-nourished. She is not ill-appearing, toxic-appearing or diaphoretic.  HENT:     Head: Normocephalic and atraumatic.     Right Ear: External ear normal.     Left Ear: External ear  normal.     Nose: Nose normal.     Mouth/Throat:     Mouth: Oropharynx is clear and moist.     Pharynx: No oropharyngeal exudate.  Eyes:     Extraocular Movements: EOM normal.     Conjunctiva/sclera: Conjunctivae normal.     Pupils: Pupils are equal, round, and reactive to light.  Cardiovascular:     Rate and Rhythm: Normal rate and regular rhythm.  Pulmonary:     Effort: Tachypnea present. No respiratory distress.     Breath sounds: No stridor. Rhonchi present. No decreased breath sounds, wheezing or rales.  Chest:     Chest wall: No tenderness.  Abdominal:     General: There is no distension.     Tenderness: There is no abdominal tenderness. There is no rebound.  Musculoskeletal:     Cervical back: Normal range of motion and neck supple.     Right lower leg: No tenderness. No edema.     Left lower leg: No tenderness. No edema.  Skin:    General: Skin is warm.     Findings: No erythema or rash.  Neurological:     General: No focal deficit present.     Mental Status: She is alert.     Motor: No abnormal muscle tone.     Coordination: Coordination normal.     Deep Tendon Reflexes: Reflexes are normal and symmetric.  Psychiatric:        Mood and Affect: Mood normal.     ED Results / Procedures / Treatments   Labs (all labs ordered are listed, but only abnormal results are displayed) Labs Reviewed  RESP PANEL BY RT-PCR (FLU A&B, COVID) ARPGX2 - Abnormal; Notable for the following components:      Result Value   SARS Coronavirus 2 by RT PCR POSITIVE (*)    All other components within normal limits  BASIC METABOLIC PANEL - Abnormal; Notable for the following components:   Sodium 127 (*)    Chloride 93 (*)    Glucose, Bld 236 (*)    BUN 34 (*)    Creatinine, Ser 1.38 (*)    Calcium 8.7 (*)    GFR, Estimated 39 (*)    All other components within normal limits  CBC  PROTIME-INR  TROPONIN I (HIGH SENSITIVITY)  TROPONIN I (HIGH SENSITIVITY)    EKG EKG  Interpretation  Date/Time:  Saturday  August 19 2020 04:09:13 EST Ventricular Rate:  90 PR Interval:    QRS Duration: 89 QT Interval:  335 QTC Calculation: 410 R Axis:   63 Text Interpretation: Sinus rhythm When compared to prior, similar apperance. No STEMI Confirmed by Antony Blackbird [phone]) on 08/19/2020 4:13:36 AM   Radiology DG Chest 2 View  Result Date: 08/18/2020 CLINICAL DATA:  Chest tightness EXAM: CHEST - 2 VIEW COMPARISON:  06/09/2019 FINDINGS: Low lung volumes. Ground-glass opacities at the left greater than right mid to lower lungs. Stable cardiomediastinal silhouette with aortic atherosclerosis. No pneumothorax. IMPRESSION: Low lung volumes with ground-glass opacities at the left greater than right mid to lower lungs, suspicious for pneumonia, potential atypical or viral pneumonia. Electronically Signed   By: Donavan Foil M.D.   On: 08/18/2020 21:53    Procedures Procedures (including critical care time)  Pamala Orlin Petty was evaluated in Emergency Department on 08/19/2020 for the symptoms described in the history of present illness. She was evaluated in the context of the global COVID-19 pandemic, which necessitated consideration that the patient might be at risk for infection with the SARS-CoV-2 virus that causes COVID-19. Institutional protocols and algorithms that pertain to the evaluation of patients at risk for COVID-19 are in a state of rapid change based on information released by regulatory bodies including the CDC and federal and state organizations. These policies and algorithms were followed during the patient's care in the ED.  CRITICAL CARE Performed by: Gwenyth Allegra Karolee Meloni Total critical care time: 20 minutes Critical care time was exclusive of separately billable procedures and treating other patients. Critical care was necessary to treat or prevent imminent or life-threatening deterioration. Critical care was time spent personally by me on the  following activities: development of treatment plan with patient and/or surrogate as well as nursing, discussions with consultants, evaluation of patient's response to treatment, examination of patient, obtaining history from patient or surrogate, ordering and performing treatments and interventions, ordering and review of laboratory studies, ordering and review of radiographic studies, pulse oximetry and re-evaluation of patient's condition.  Medications Ordered in ED Medications  sodium chloride 0.9 % bolus 1,000 mL (1,000 mLs Intravenous New Bag/Given 08/19/20 0540)    ED Course  I have reviewed the triage vital signs and the nursing notes.  Pertinent labs & imaging results that were available during my care of the patient were reviewed by me and considered in my medical decision making (see chart for details).    MDM Rules/Calculators/A&P                          Londa Orlin Petty is a 79 y.o. female with a past medical history significant for hypertension, diabetes, hypothyroidism, hyperlipidemia, GERD, CAD, and poor hearing who presents with chills, cough, malaise, shortness of breath, and chest tightness.  Patient accompanied by son who was able divide some history.  Interpreter was used for all conversations.  Patient says that for the last few days she has been having cough and chest tightness.  She thinks she was positive with Covid on Monday but then was tested today and was negative.  She does report a positive recent exposure to Covid and she is vaccinated.  She reports chest tightness and shortness of breath and was found of oxygen saturations in the 80s on arrival.  She denies nausea or vomiting and denies constipation or diarrhea.  Denies any urinary changes.  Reports malaise and fatigue all over.  No  reported abdominal pain or headache.  On exam, patient has coarse breath sounds bilaterally.  Chest and abdomen nontender.  Good pulses in extremities.  Legs nontender  nonedematous.  EKG shows no STEMI.  Due to patient's hypoxia now requiring several liters of oxygen, I anticipate she will need admission.  We will get labs and imaging.  X-ray shows concern for possible atypical pneumonia.  Covid test was ordered and returned positive.  Suspect Covid is the cause of her hypoxia shortness of breath and cough.  She was also found to be hyponatremic with a sodium of 127 and chloride of 93.  We will give some fluids.  We will call for admission for COVID-19 with hypoxia and electrolyte imbalances.  Patient admitted for further management.   Final Clinical Impression(s) / ED Diagnoses Final diagnoses:  COVID  Hypoxia  Hyponatremia    Rx / DC Orders ED Discharge Orders    None     Clinical Impression: 1. COVID   2. Hypoxia   3. Hyponatremia     Disposition: Admit  This note was prepared with assistance of Dragon voice recognition software. Occasional wrong-word or sound-a-like substitutions may have occurred due to the inherent limitations of voice recognition software.     Leniya Breit, Gwenyth Allegra, MD 08/19/20 [phone]

## 2020-08-19 NOTE — Hospital Course (Signed)
Admitted 08/18/2020   Allergies: Hydrochlorothiazide Pertinent Hx: CAD, DM, GERD, Depression, Memory deficit, hypothyroidism, HTN, HLD, TIA  79 y.o. female p/w SHOB  * COVID - started on remdesivir and steroids. On 3 L [place].  Consults: none  Meds:  VTE ppx: enoxaparin IVF: NS 125 cc/hr Diet: HH/CM  ASA, atorvastatin, carvedilol, chlorthalidone, clopidogrel, levothyroxine, lisinopril, memotine, nitro, omeprazole.

## 2020-08-19 NOTE — TOC Initial Note (Signed)
Transition of Care [facility]) - Initial/Assessment Note    Patient Details  Name: Wendy Petty MRN: [id number] Date of Birth: [birth year]  Transition [facility]) CM/SW Contact:    Verdell Carmine, RN Phone Number: 08/19/2020, 11:41 AM  Clinical Narrative:                  Patient in with COVID symptoms, she wishes to go home, even though admission was recommended. Asked for oxygen saturation parameters from RN @ [phone].with instructions. Desaturates without oxygen to 84%. [place] for oxygen delivery  Still need parameters placed for qualification of oxygen. MD and RN aware that these are needed.        Patient Goals and CMS Choice        Expected Discharge Plan and Services                                                Prior Living Arrangements/Services                       Activities of Daily Living      Permission Sought/Granted                  Emotional Assessment              Admission diagnosis:  COVID; SOB  Patient Active Problem List   Diagnosis Date Noted  . Meniere's disease 11/17/2019  . Primary osteoarthritis of right knee 07/15/2019  . Low back pain 07/15/2019  . Primary osteoarthritis of left knee 06/01/2019  . Seasonal allergies 11/09/2018  . Nocturnal muscle cramp 11/09/2018  . Breast mass, right 11/23/2015  . 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 Ear noise/buzzing 10/30/2015  . Chronic low back pain 10/30/2015  . Type 2 diabetes mellitus with microalbuminuria ([place]) 09/29/2015  . Poorly fitting dentures 09/29/2015  . Hypothyroidism 09/29/2015  . Primary osteoarthritis of both knees 09/29/2015  . Spondylolisthesis 07/19/2015  . Overweight (BMI 25.0-29.9) 06/29/2015  . Memory deficit 11/28/2014  . Coronary atherosclerosis of native coronary artery 06/24/2013  . History of TIAs 06/24/2013  . GERD (gastroesophageal reflux disease) 06/24/2013  . Depression 12/31/2012  . THYROID  NODULE 02/14/2009  . Elevated lipids 02/14/2009  . ANEMIA, CHRONIC 02/14/2009  . Essential hypertension 02/14/2009  . PULMONARY NODULE 02/14/2009   PCP:  [name], MD Pharmacy:   [place], [place] - [address] [facility]. [address] [place] [address] Phone: [phone] Fax: [phone]  [place] ([place]), [place] - [address] [place] ([place]) [place] [address] Phone: [phone] Fax: [place], [address] [place] [address] Phone: [phone] Fax: [place], [place] - [address] AT [address] [place] [address] Phone: [phone] Fax: [place] [facility] [address] [place] [address] Phone: [phone] Fax: [phone]     Social Determinants of Health (SDOH) Interventions    Readmission Risk Interventions No flowsheet data found.

## 2020-08-19 NOTE — ED Notes (Signed)
Patient Saturations on Room Air at Rest  87 % Patient Saturations on Room Air while Ambulating  84% Patient Saturations on 2 Liters of oxygen while Ambulating = 93%  Pt refusing to stay in hospital for treatment and is signing out AMA.

## 2020-08-19 NOTE — ED Notes (Signed)
[phone] Wendy Petty would like calls with updates on her grandmother

## 2020-08-19 NOTE — ED Notes (Signed)
Without O2 on pt SPO2 dropped to 84%

## 2020-08-19 NOTE — ED Notes (Signed)
The pt has been ill for  Several days not feeling well she has had a dry nagging cough  Unsure if she has had a temp  The pt speaks no english and the family at bedside speaks little or no english   Hx of pos covid  ??? And earlier today had a negative covid test

## 2020-08-19 NOTE — ED Notes (Signed)
Patient oxygen on RA 88%. Placed on 2L [place] per RN Janett Billow. Oxygen improved to 93%.

## 2020-08-19 NOTE — ED Provider Notes (Signed)
I assumed care in signout Original plan was to be admitted due to COVID-19 and on oxygen Patient is refusing admission.  She has been seen by the inpatient team and I discussed the case extensively with patient and her family, and she still refuses admission.  Also involved her granddaughter via phone who speaks English Patient will be signed out [place] oxygen has been arranged and will be set up at home. Patient has been given an MAB infusion The internal medicine service may also send her home on steroids as well as potential insulin due to hyperglycemia Patient will be referred to the outpatient Covid clinic BP 133/64    Pulse 91    Temp 99.3 F (37.4 C) (Oral)    Resp (!) 36    Ht 1.524 m (5')    Wt 54.4 kg    SpO2 96%    BMI 23.44 kg/m  Patient and family understand that this is a life-threatening illness and she could potentially die from [place] was evaluated in Emergency Department on 08/19/2020 for the symptoms described in the history of present illness. She was evaluated in the context of the global COVID-19 pandemic, which necessitated consideration that the patient might be at risk for infection with the SARS-CoV-2 virus that causes COVID-19. Institutional protocols and algorithms that pertain to the evaluation of patients at risk for COVID-19 are in a state of rapid change based on information released by regulatory bodies including the CDC and federal and state organizations. These policies and algorithms were followed during the patient's care in the ED.    Ripley Fraise, MD 08/19/20 1202

## 2020-08-19 NOTE — Consult Note (Signed)
Date: 08/19/2020               Patient Name:  Wendy Petty MRN: [id number]  DOB: 08/17/1941 Age / Sex: 79 y.o., female   PCP: [name], MD         Requesting Physician: Dr. Sherry Ruffing, Gwenyth Allegra, *    Consulting Reason:  Covid infection     Chief Complaint: Shortness of breath  History of Present Illness: Patient is a 79 yo Spanish-speaking woman w/ PMH of CAD, HLD, T2DM, hypothyroidism, osteoarthritis and GERD who presented to [facility] for an evaluation of shortness of breath after testing positive for covid on Tuesday. Per granddaughter, patient started having cold and cough symptoms on Monday. She was tested for covid on Tuesday and there results came back positive. Patient was isolating at home and since then, has had decreased po intake. Patient has had generalized weakness and shortness of breath. They decided to send her to the hospital so she can get the monoclonal antibody. Patient currently denies fever/chills, cough, chest pain, N/V, headache, dizziness, leg pain, abd pain or loss of taste/smell. Family states patient does not want to be admitted to the hospital.   Meds: Current Facility-Administered Medications  Medication Dose Route Frequency Provider Last Rate Last Admin   methylPREDNISolone acetate (DEPO-MEDROL) injection 40 mg  40 mg Other Once Magnus Sinning, MD       remdesivir 200 mg in sodium chloride 0.9% 250 mL IVPB  200 mg Intravenous Once Amend, Caron G, RPH       Followed by   Derrill Memo ON 08/20/2020] remdesivir 100 mg in sodium chloride 0.9 % 100 mL IVPB  100 mg Intravenous Daily Franky Macho, [facility]       Current Outpatient Medications  Medication Sig Dispense Refill   aspirin 325 MG tablet Take 325 mg by mouth daily.     atorvastatin (LIPITOR) 20 MG tablet Take 20 mg by mouth daily.     carvedilol (COREG) 25 MG tablet Take 1 tablet (25 mg total) by mouth 2 (two) times daily with a meal. 180 tablet 3   chlorthalidone (HYGROTON) 25 MG tablet  Take 0.5 tablets (12.5 mg total) by mouth daily. Stop hydrochlorothiazide. 90 tablet 3   clopidogrel (PLAVIX) 75 MG tablet Take 75 mg by mouth daily.     levothyroxine (SYNTHROID) 25 MCG tablet TAKE 1 TABLET(25 MCG) BY MOUTH DAILY. Must have office visit. Was a no-show in April. 30 tablet 12   lisinopril (ZESTRIL) 20 MG tablet TAKE 1 TABLET (20 MG TOTAL) BY MOUTH DAILY. Must have office visit. Was a no-show in April. 90 tablet 3   memantine (NAMENDA) 10 MG tablet TAKE 1 TABLET BY MOUTH DAILY (Patient taking differently: Take 10 mg by mouth daily.) 30 tablet 0   nitroGLYCERIN (NITROSTAT) 0.4 MG SL tablet Place 1 tablet (0.4 mg total) under the tongue every 5 (five) minutes as needed for chest pain. 15 tablet 2   omeprazole (PRILOSEC) 20 MG capsule Take 1 capsule (20 mg total) by mouth daily. Must have office visit. Was a no-show in April. 30 capsule 1   atorvastatin (LIPITOR) 80 MG tablet Take 1 tablet (80 mg total) by mouth daily. Must have office visit. Was a no-show in April. (Patient not taking: No sig reported) 30 tablet 0   metFORMIN (GLUCOPHAGE) 500 MG tablet Take 0.5 tablets (250 mg total) by mouth daily with breakfast. 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   Breast mass, right 11/23/2015   CHEST PAIN UNSPECIFIED    Coronary atherosclerosis of native coronary artery 06/24/2013   Depression 12/31/2012   Diabetes mellitus without complication ([place]) 83/3825   Edema    GERD (gastroesophageal reflux disease)    History of TIAs 06/24/2013   Hx of cardiovascular stress test    Lexiscan Myoview (11/14):  No ischemia, EF 68% (normal study)   HYPERLIPIDEMIA    Hypertension    Hypothyroidism 09/29/2015   Memory  deficit 11/28/2014   Primary osteoarthritis of both knees 09/29/2015   PULMONARY NODULE    THYROID NODULE    Type 2 diabetes mellitus with microalbuminuria ([place]) 09/29/2015   Past Surgical History:  Procedure Laterality Date   LEFT HEART CATHETERIZATION WITH CORONARY ANGIOGRAM N/A 09/16/2011   Procedure: LEFT HEART CATHETERIZATION WITH CORONARY ANGIOGRAM;  Surgeon: [name], MD;  Location: [facility] CATH LAB;  Service: Cardiovascular;  Laterality: N/A;   TUBAL LIGATION     VAGINAL HYSTERECTOMY  1989   Family History  Problem Relation Age of Onset   Heart disease Mother    Cancer Father        a tumor ruptured in his abd.   Social History   Socioeconomic History   Marital status: Widowed    Spouse name: Not on file   Number of children: Not on file   Years of education: Not on file   Highest education level: Not on file  Occupational History   Not on file  Tobacco Use   Smoking status: Never Smoker   Smokeless tobacco: Never Used  Vaping Use   Vaping Use: Never used  Substance and Sexual Activity   Alcohol use: No    Alcohol/week: 0.0 standard drinks   Drug use: No   Sexual activity: Never  Other Topics Concern   Not on file  Social History Narrative   Lives with daughter in a 2 story home.  Does not work.    Social Determinants of Health   Financial Resource Strain: Not on file  Food Insecurity: Not on file  Transportation Needs: Not on file  Physical Activity: Not on file  Stress: Not on file  Social Connections: Not on file  Intimate Partner Violence: Not on file    Review of Systems: Pertinent items are noted in HPI.  Physical Exam: Blood pressure (!) 144/73, pulse 90, temperature 99.2 F (37.3 C), temperature source Oral, resp. rate (!) 35, height 5' (1.524 m), weight 54.4 kg, SpO2 96 %. BP (!) 144/73    Pulse 90    Temp 99.2 F (37.3 C) (Oral)    Resp (!) 35    Ht 5' (1.524 m)    Wt 54.4 kg    SpO2 96%    BMI 23.44 kg/m   General:  Pleasant, well-appearing elderly woman laying in bed. No acute distress. Head: Normocephalic. Atraumatic. HEENT: Dry mucous membrane. Normal conjunctiva.  CV: RRR. No murmurs, rubs, or gallops. No LE edema Pulmonary: Lungs CTAB. Normal effort. No wheezing. Mild bibasilar rales in the bases.  Abdominal: Soft, nontender, nondistended. Normal bowel sounds. Extremities: Palpable pulses. Normal ROM. Skin: Warm and dry. No obvious rash or lesions. Decreased skin tugor.  Neuro: A&Ox3. Moves all extremities. Normal sensation. No focal deficit. Psych:  Normal mood and affect  Lab results: CBC    Component Value Date/Time   WBC 8.8 08/18/2020 2149   RBC 4.05 08/18/2020 2149   HGB 12.2 08/18/2020 2149   HGB 12.2 06/18/2018 1210   HCT 37.9 08/18/2020 2149   HCT 37.6 06/18/2018 1210   PLT 180 08/18/2020 2149   PLT 238 06/18/2018 1210   MCV 93.6 08/18/2020 2149   MCV 90 06/18/2018 1210   MCH 30.1 08/18/2020 2149   MCHC 32.2 08/18/2020 2149   RDW 12.3 08/18/2020 2149   RDW 12.0 (L) 06/18/2018 1210   LYMPHSABS 1.9 06/18/2018 1210   MONOABS 0.5 06/28/2013 1820   EOSABS 0.3 06/18/2018 1210   BASOSABS 0.0 06/18/2018 1210    Imaging results:  DG Chest 2 View  Result Date: 08/18/2020 CLINICAL DATA:  Chest tightness EXAM: CHEST - 2 VIEW COMPARISON:  06/09/2019 FINDINGS: Low lung volumes. Ground-glass opacities at the left greater than right mid to lower lungs. Stable cardiomediastinal silhouette with aortic atherosclerosis. No pneumothorax. IMPRESSION: Low lung volumes with ground-glass opacities at the left greater than right mid to lower lungs, suspicious for pneumonia, potential atypical or viral pneumonia. Electronically Signed   By: Donavan Foil M.D.   On: 08/18/2020 21:53    Other results: EKG: normal EKG, normal sinus rhythm, unchanged from previous tracings.  Assessment, Plan, & Recommendations by Problem: Active Problems:   * No active hospital problems. *  #Covid  pneumonia Patient w/ hx of T2DM, HTN, CAD presented w/ shortness of breath and found to be in acute hypoxic respiratory failure, currently requiring 2L oxygen. Patient vaccinated w/ 2 doses of AstraZen[employer] vaccine over the [place]. COVID + on 08/15/20 . No white count. CXR shows low volume w/ ground-glass opacities in the lower lung fields (L>R). Patient w/ bibasilar rales on exam. O2 sats now at 94-97% on 2 L. O2 dropped to 89% on RA. Need admission for treatment for COVID pneumonia. Patient does not want to be admitted to the hospital and wants to go home and get the monoclonal antibody in the outpatient. Patient was advised that due to her oxygen requirement, she needs to be hospitalized until she can maintain appropriate O2 level on RA. Family still would like to avoid hospitalization so the risk of leaving AMA such as worsening respiratory status was explained to the family. Team explained that we would try to arrange for her to get O2 at home if she wants to leave and family was agreeable to this plan.  --O2 dropped to 84% on ambulation. Home oxygen placed for [facility]  --TOC consult placed --Started remdesivir (day 1/5) --Started dexamethasone (day 1/10) --Trend CMP, inflammatory markers --O2 as need to keep >88 --Early ambulation, proning as tolerated, incentive spirometry --SSI while on steroids --Patient leaving AMA   --Would need to isolate until 09/04/20   --Would need to follow up with outpatient monoclonal antibody treatment   --Prescription for prednisone 40 mg daily for 9 days sent to pharmacy  #T2DM Well-controlled with an A1C of 6.1 a year ago. Currently on metformin 250 mg daily. Not on insulin. Blood sugar of 236 on BMP.  --Started on steroids, will require insulin --Humalog (inject 8 units w/ meals TID) and diabetes supplies sent to pharmacy.  --Will need follow up with PCP --Will need repeat A1C in the outpatient The patient is insulin requiring, ICD 10 code E11.9. The patient  tests 3 times per day.   #AKI Patient was found to have cr of 1.38 from  1.25 ten months ago w/ sodium of 127. Likely 2/2 to dehydration from poor po intake in the setting of recent Covid infection. S/p 1 L IVNS. Will give another bolus. --ordered addition 500 mL NS bolus --Repeat BMP  #HTN Found to have soft BP of 100/64 on admission. Likely an element of dehydration 2/2 to poor po intake. On lisinopril 20 mg, coreg 25 mg BID and chlorthalidone 12.5 mg at home. BP now improved to 131/61 s/p IVF.   #Hypothyroidism Chronic and stable. On synthroid 25 mcg daily. --Continue home meds  #CAD Stable and chronic. Recent ECHO on 10/21 showed low-normal EF w/ normal valves. On Plavix 75 mg daily and Lipitor 80 mg daily. Nitro as needed. Denies chest pain today.  --Continue home meds  #GERD Chronic and stable. On omeprazole 20 mg daily.  --Continue home PPI  Signed: Lacinda Axon, MD 08/19/2020, 8:19 AM

## 2020-08-27 ENCOUNTER — Other Ambulatory Visit: Payer: Self-pay | Admitting: Student

## 2020-09-12 ENCOUNTER — Telehealth: Payer: Self-pay | Admitting: Internal Medicine

## 2020-09-12 NOTE — Telephone Encounter (Signed)
Home Health Verbal Orders - Caller/Agency: Darnelle, PT with [facility] Callback Number: [phone] Requesting OT/PT/Skilled Nursing/Social Work/Speech Therapy: PT  Frequency:  2w1 1w8  Also wants:  Family declined Home Health aid, stating they can assist her with bathing Pt is very depressed since Covid diagnosis Has stage 2 pressure ulcer on her left buttock, nurse is coming to examine and will update office on status

## 2020-09-12 NOTE — Telephone Encounter (Signed)
FYI  Returned call and left a detailed vm giving verbal orders

## 2020-09-13 NOTE — Telephone Encounter (Signed)
Phone call returned to Darnelle the PT from [facility].  Informed her that I was returning her call in regards to this patient's home health needs.  She reports that patient was hospitalized through [facility] 12/16-31/202.I looked at care everywhere.  She was hospitalized with hypoxic respiratory failure due to Covid pneumonia.  She was discharged on 3 L O2 and Xarelto for total of 35 days.  CTA of the chest negative for PE but patient had D-dimer greater than 9.  Hospital discharge stated that she has a right breast mass that needs to be followed up on as outpatient.  Darnelle visited the patient 3 days ago at home and reports that the patient is very weak with poor oral intake.  She needs full assistance with transfers and at times refuses to get out of bed.  She has a stage II pressure sore on her left buttock.  The RN will be seeing her today or may have seen her yesterday already.  She will have that RN follow-up with me.  She also encourage the patient's family to help with position changes and to have her sit up in the chair instead of her laying in bed all day on that one side.  Patient seems depressed and family is concerned that she is depressed and has given up.  Patient has refused to have family members and friends visit with her and make statements about she is not sure how long she will be here. Physical therapy, speech therapy, occupational therapy, nurse and social worker will be seeing the patient.  Family has refused home health aide stating that they will be able to assist her with feeding, changing and bathing.  Darnelle notes that family is very supportive.  Patient has a visit with me later this month.  Will discuss issues including assessing with the depression is any better since starting in-home therapies.  If patient is not improving, will speak with family regarding palliative care evaluation.

## 2020-10-02 ENCOUNTER — Ambulatory Visit: Payer: Medicare Other | Admitting: Internal Medicine
